# Patient Record
Sex: Female | Born: 1950
Health system: Southern US, Community
[De-identification: ages and names within clinical notes are randomized; demographics above are authoritative.]

## PROBLEM LIST (undated history)

## (undated) DIAGNOSIS — D649 Anemia, unspecified: Secondary | ICD-10-CM

## (undated) DIAGNOSIS — D352 Benign neoplasm of pituitary gland: Secondary | ICD-10-CM

## (undated) DIAGNOSIS — E785 Hyperlipidemia, unspecified: Secondary | ICD-10-CM

## (undated) DIAGNOSIS — I10 Essential (primary) hypertension: Secondary | ICD-10-CM

## (undated) DIAGNOSIS — E119 Type 2 diabetes mellitus without complications: Secondary | ICD-10-CM

## (undated) DIAGNOSIS — M1990 Unspecified osteoarthritis, unspecified site: Secondary | ICD-10-CM

## (undated) HISTORY — PX: EYE SURGERY: SHX253

## (undated) HISTORY — DX: Type 2 diabetes mellitus without complications: E11.9

## (undated) HISTORY — PX: ABDOMINAL HYSTERECTOMY: SHX81

## (undated) HISTORY — PX: COLONOSCOPY: SHX174

## (undated) HISTORY — DX: Hyperlipidemia, unspecified: E78.5

---

## 2016-11-26 ENCOUNTER — Emergency Department (HOSPITAL_COMMUNITY): Payer: 59

## 2016-11-26 ENCOUNTER — Encounter (HOSPITAL_COMMUNITY): Payer: Self-pay | Admitting: Emergency Medicine

## 2016-11-26 ENCOUNTER — Emergency Department (HOSPITAL_COMMUNITY)
Admission: EM | Admit: 2016-11-26 | Discharge: 2016-11-26 | Disposition: A | Discharge status: Home / Self Care | Payer: 59 | Attending: Emergency Medicine | Admitting: Emergency Medicine

## 2016-11-26 DIAGNOSIS — R202 Paresthesia of skin: Secondary | ICD-10-CM | POA: Diagnosis not present

## 2016-11-26 DIAGNOSIS — I10 Essential (primary) hypertension: Secondary | ICD-10-CM | POA: Insufficient documentation

## 2016-11-26 DIAGNOSIS — Z79899 Other long term (current) drug therapy: Secondary | ICD-10-CM | POA: Diagnosis not present

## 2016-11-26 DIAGNOSIS — R200 Anesthesia of skin: Secondary | ICD-10-CM | POA: Diagnosis present

## 2016-11-26 HISTORY — DX: Unspecified osteoarthritis, unspecified site: M19.90

## 2016-11-26 HISTORY — DX: Essential (primary) hypertension: I10

## 2016-11-26 LAB — CBC
HEMATOCRIT: 45.9 % (ref 36.0–46.0)
HEMOGLOBIN: 15.3 g/dL — AB (ref 12.0–15.0)
MCH: 29.4 pg (ref 26.0–34.0)
MCHC: 33.3 g/dL (ref 30.0–36.0)
MCV: 88.3 fL (ref 78.0–100.0)
Platelets: 343 10*3/uL (ref 150–400)
RBC: 5.2 MIL/uL — ABNORMAL HIGH (ref 3.87–5.11)
RDW: 13.1 % (ref 11.5–15.5)
WBC: 13.1 10*3/uL — AB (ref 4.0–10.5)

## 2016-11-26 LAB — DIFFERENTIAL
Basophils Absolute: 0 10*3/uL (ref 0.0–0.1)
Basophils Relative: 0 %
EOS PCT: 1 %
Eosinophils Absolute: 0.1 10*3/uL (ref 0.0–0.7)
LYMPHS ABS: 3.8 10*3/uL (ref 0.7–4.0)
LYMPHS PCT: 29 %
MONO ABS: 0.6 10*3/uL (ref 0.1–1.0)
MONOS PCT: 4 %
NEUTROS ABS: 8.6 10*3/uL — AB (ref 1.7–7.7)
Neutrophils Relative %: 66 %

## 2016-11-26 LAB — COMPREHENSIVE METABOLIC PANEL
ALK PHOS: 103 U/L (ref 38–126)
ALT: 28 U/L (ref 14–54)
ANION GAP: 9 (ref 5–15)
AST: 26 U/L (ref 15–41)
Albumin: 4.8 g/dL (ref 3.5–5.0)
BILIRUBIN TOTAL: 0.4 mg/dL (ref 0.3–1.2)
BUN: 12 mg/dL (ref 6–20)
CO2: 28 mmol/L (ref 22–32)
Calcium: 10.7 mg/dL — ABNORMAL HIGH (ref 8.9–10.3)
Chloride: 100 mmol/L — ABNORMAL LOW (ref 101–111)
Creatinine, Ser: 0.8 mg/dL (ref 0.44–1.00)
GFR calc Af Amer: >60 mL/min (ref >60)
GFR calc non Af Amer: >60 mL/min (ref >60)
GLUCOSE: 131 mg/dL — AB (ref 65–99)
POTASSIUM: 3.3 mmol/L — AB (ref 3.5–5.1)
Sodium: 137 mmol/L (ref 135–145)
Total Protein: 9.4 g/dL — ABNORMAL HIGH (ref 6.5–8.1)

## 2016-11-26 LAB — I-STAT CHEM 8, ED
BUN: 13 mg/dL (ref 6–20)
CALCIUM ION: 1.3 mmol/L (ref 1.15–1.40)
Chloride: 99 mmol/L — ABNORMAL LOW (ref 101–111)
Creatinine, Ser: 0.8 mg/dL (ref 0.44–1.00)
GLUCOSE: 129 mg/dL — AB (ref 65–99)
HCT: 49 % — ABNORMAL HIGH (ref 36.0–46.0)
HEMOGLOBIN: 16.7 g/dL — AB (ref 12.0–15.0)
POTASSIUM: 4.2 mmol/L (ref 3.5–5.1)
Sodium: 140 mmol/L (ref 135–145)
TCO2: 34 mmol/L — AB (ref 22–32)

## 2016-11-26 LAB — I-STAT TROPONIN, ED: TROPONIN I, POC: 0.01 ng/mL (ref 0.00–0.08)

## 2016-11-26 LAB — CBG MONITORING, ED: GLUCOSE-CAPILLARY: 100 mg/dL — AB (ref 65–99)

## 2016-11-26 LAB — PROTIME-INR
INR: 0.87
PROTHROMBIN TIME: 11.7 s (ref 11.4–15.2)

## 2016-11-26 LAB — APTT: aPTT: 23 seconds — ABNORMAL LOW (ref 24–36)

## 2016-11-26 MED ORDER — POTASSIUM CHLORIDE CRYS ER 20 MEQ PO TBCR
40.0000 meq | EXTENDED_RELEASE_TABLET | Freq: Two times a day (BID) | ORAL | Status: DC
  Administered 2016-11-26: 40 meq via ORAL
  Filled 2016-11-26: qty 2

## 2016-11-26 MED ORDER — METOPROLOL SUCCINATE ER 25 MG PO TB24
25.0000 mg | ORAL_TABLET | Freq: Once | ORAL | Status: AC
  Administered 2016-11-26: 25 mg via ORAL
  Filled 2016-11-26: qty 1

## 2016-11-26 MED ORDER — LOSARTAN POTASSIUM 50 MG PO TABS
50.0000 mg | ORAL_TABLET | Freq: Once | ORAL | Status: AC
  Administered 2016-11-26: 50 mg via ORAL
  Filled 2016-11-26: qty 1

## 2016-11-26 NOTE — ED Notes (Signed)
Called pt to draw lab work but there was no reply

## 2016-11-26 NOTE — Discharge Instructions (Signed)
Please read instructions below. Establish primary care and follow up on your blood pressure, as well as check your endocrine function as recommended from your CT scan results. It is recommended to received an MRI in 6-12 months to follow up on  Schedule an appointment with neurology for further evaluation of the numbness sensation in your hands. Take your blood pressure medication as prescribed. Return to the ER for vision changes, severe headache, chest pain, shortness or breath, or new or concerning symptoms.

## 2016-11-26 NOTE — ED Provider Notes (Signed)
[facility] DEPT Provider Note   CSN: [id number] Arrival date & time: 11/26/16  1857     History   Chief Complaint Chief Complaint  Patient presents with  . Hypertension  . Numbness    HPI Elaine Burke is a 66 y.o. female w PMHx HTN, HLD, arthritis, presenting to the ED from [facility] w gradual onset of intermittent numbness/weakness in b/l hands. She states sx started 2 weeks ago and initially symptoms were in her left hand however last week both hands were affected. Pt also with HTN in triage, and reports taking amlodipine, losartan, metoprolol and triamterene-HCTZ daily for HTN. She reports she did not taken losartan today because she read online that her symptoms could be from low potassium which she has had that side effect in the past from losartan.  She denies headache, vision changes, CP, SOB, abd pain, neck pain, nausea, tinnitus, dizziness, urinary sx, or any other complaints today.  The history is provided by the patient.    Past Medical History:  Diagnosis Date  . Arthritis   . Hypertension     There are no active problems to display for this patient.   History reviewed. No pertinent surgical history.  OB History    No data available       Home Medications    Prior to Admission medications   Medication Sig Start Date End Date Taking? Authorizing Provider  amLODipine (NORVASC) 10 MG tablet Take 1 tablet by mouth daily. 10/30/16  Yes [provider]  losartan (COZAAR) 50 MG tablet Take 1 tablet by mouth daily. 10/30/16  Yes [provider]  metoprolol succinate (TOPROL-XL) 50 MG 24 hr tablet Take 1 tablet by mouth daily. 10/30/16  Yes [provider]  triamterene-hydrochlorothiazide (DYAZIDE) 37.5-25 MG capsule Take 1 capsule by mouth daily. 10/30/16  Yes [provider]    Family History No family history on file.  Social History Social History  Substance Use Topics  . Smoking status: Never Smoker  . Smokeless  tobacco: Not on file  . Alcohol use No     Allergies   Lisinopril and Penicillins   Review of Systems Review of Systems  Constitutional: Negative for fever.  HENT: Negative for tinnitus.   Eyes: Negative for photophobia and visual disturbance.  Respiratory: Negative for shortness of breath.   Cardiovascular: Negative for chest pain, palpitations and leg swelling.  Gastrointestinal: Negative for abdominal pain, nausea and vomiting.  Genitourinary: Negative for dysuria and frequency.  Musculoskeletal: Negative for neck pain and neck stiffness.  Neurological: Positive for weakness and numbness. Negative for dizziness, speech difficulty and headaches.  All other systems reviewed and are negative.    Physical Exam Updated Vital Signs BP (!) 202/96 (BP Location: Right Arm)   Pulse 74   Temp 97.6 F (36.4 C) (Oral)   Resp 18   Ht 5\' 3"  (1.6 m)   Wt 85.7 kg (189 lb)   SpO2 99%   BMI 33.48 kg/m   Physical Exam  Constitutional: She appears well-developed and well-nourished. No distress.  Well-appearing.  HENT:  Head: Normocephalic and atraumatic.  Mouth/Throat: Oropharynx is clear and moist.  Eyes: Pupils are equal, round, and reactive to light. Conjunctivae and EOM are normal.  Neck: Normal range of motion. Neck supple.  No c-spine or paraspinal tenderness.  Cardiovascular: Normal rate, regular rhythm, normal heart sounds and intact distal pulses.  Exam reveals no friction rub.   No murmur heard. No LE edema  Pulmonary/Chest: Effort normal  and breath sounds normal. No respiratory distress. She has no wheezes. She has no rales.  Abdominal: Soft. Bowel sounds are normal. She exhibits no distension and no mass. There is no tenderness.  Neurological: She is alert.  Mental Status:  Alert, oriented, thought content appropriate, able to give a coherent history. Speech fluent without evidence of aphasia. Able to follow 2 step commands without difficulty.  Cranial Nerves:  II:   Peripheral visual fields grossly normal, pupils equal, round, reactive to light III,IV, VI: ptosis not present, extra-ocular motions intact bilaterally  V,VII: smile symmetric, facial light touch sensation equal VIII: hearing grossly normal to voice  X: uvula elevates symmetrically  XI: bilateral shoulder shrug symmetric and strong XII: midline tongue extension without fassiculations Motor:  Normal tone. 5/5 in upper and lower extremities bilaterally including strong and equal grip strength and dorsiflexion/plantar flexion Sensory: Pinprick and light touch normal in all extremities.  Deep Tendon Reflexes: 2+ and symmetric in the biceps and patella Cerebellar: normal finger-to-nose with bilateral upper extremities and normal heel-to-shin BLE Gait: normal gait and balance CV: distal pulses palpable throughout    Skin: Skin is warm.  Psychiatric: She has a normal mood and affect. Her behavior is normal.  Nursing note and vitals reviewed.    ED Treatments / Results  Labs (all labs ordered are listed, but only abnormal results are displayed) Labs Reviewed  APTT - Abnormal; Notable for the following:       Result Value   aPTT 23 (*)    All other components within normal limits  CBC - Abnormal; Notable for the following:    WBC 13.1 (*)    RBC 5.20 (*)    Hemoglobin 15.3 (*)    All other components within normal limits  DIFFERENTIAL - Abnormal; Notable for the following:    Neutro Abs 8.6 (*)    All other components within normal limits  COMPREHENSIVE METABOLIC PANEL - Abnormal; Notable for the following:    Potassium 3.3 (*)    Chloride 100 (*)    Glucose, Bld 131 (*)    Calcium 10.7 (*)    Total Protein 9.4 (*)    All other components within normal limits  CBG MONITORING, ED - Abnormal; Notable for the following:    Glucose-Capillary 100 (*)    All other components within normal limits  I-STAT CHEM 8, ED - Abnormal; Notable for the following:    Chloride 99 (*)    Glucose,  Bld 129 (*)    TCO2 34 (*)    Hemoglobin 16.7 (*)    HCT 49.0 (*)    All other components within normal limits  PROTIME-INR  I-STAT TROPONIN, ED    EKG  EKG Interpretation  Date/Time:  Thursday November 26 2016 19:48:42 EDT Ventricular Rate:  73 PR Interval:    QRS Duration: 88 QT Interval:  371 QTC Calculation: 409 R Axis:   74 Text Interpretation:  Sinus rhythm Baseline wander in lead(s) V2 V3 no prior EKG  Confirmed by Brantley Stage [phone]) on 11/26/2016 9:15:52 PM       Radiology Ct Head Wo Contrast  Result Date: 11/26/2016 CLINICAL DATA:  Generalized hand weakness and numbness EXAM: CT HEAD WITHOUT CONTRAST TECHNIQUE: Contiguous axial images were obtained from the base of the skull through the vertex without intravenous contrast. COMPARISON:  None. FINDINGS: Brain: No acute territorial infarction, or hemorrhage is seen. Borderline to slightly enlarged pituitary gland with height measurement of 11 mm. Nonenlarged ventricles. Vascular: No  hyperdense vessels.  No unexpected calcification Skull: No fracture or suspicious bone lesion Sinuses/Orbits: No acute finding. Other: None IMPRESSION: 1. No CT evidence for acute intracranial abnormality. 2. Convex enlargement of the pituitary gland, recommend correlation with endocrine function tests ; could consider 6-12 month follow-up pituitary MRI Electronically Signed   By: Donavan Foil M.D.   On: 11/26/2016 20:48    Procedures Procedures (including critical care time)  Medications Ordered in ED Medications  metoprolol succinate (TOPROL-XL) 24 hr tablet 25 mg (not administered)  losartan (COZAAR) tablet 50 mg (not administered)  potassium chloride SA (K-DUR,KLOR-CON) CR tablet 40 mEq (not administered)     Initial Impression / Assessment and Plan / ED Course  I have reviewed the triage vital signs and the nursing notes.  Pertinent labs & imaging results that were available during my care of the patient were reviewed by me and  considered in my medical decision making (see chart for details).     Pt presenting with HTN as well as numbness/weakness in b/l hands x2 weeks. Pt received CT head wo contrast that was ordered in triage, resulting negative for acute pathology but recommends follow up on endocrine function. Pt without symptoms of end-organ damage, no CP, SOB, HA, vision changes. Normal neurologic exam with strong grip bilaterally. No peripheral edema, normal lung exam. Cr wnl. No hx DM. Home evening dose of metoprolol given as well as missed dose of losartan. Neurology referral given for paresthesias. Pt advised to establish primary care to follow up on BP and check endocrine labs as well as discuss follow up imaging on pituitary gland. Pt is safe for discharge.  Patient discussed with Dr. Oleta Mouse who guided treatment and agrees with care plan.  Discussed results, findings, treatment and follow up. Patient advised of return precautions. Patient verbalized understanding and agreed with plan.  Final Clinical Impressions(s) / ED Diagnoses   Final diagnoses:  Hypertension, unspecified type  Paresthesias   New Prescriptions New Prescriptions   No medications on file     Russo, [place] N, PA-C 11/26/16 2150    Forde Dandy, MD 11/27/16 [phone]

## 2016-11-26 NOTE — ED Triage Notes (Signed)
Pt complaint of generalized hand weakness/numbness to both hands onset 2 weeks ago; seen at [facility] for same; noted hypertension.

## 2016-11-30 ENCOUNTER — Encounter: Payer: Self-pay | Admitting: Neurology

## 2016-12-03 ENCOUNTER — Other Ambulatory Visit: Payer: Self-pay | Admitting: Family Medicine

## 2016-12-03 DIAGNOSIS — Z1231 Encounter for screening mammogram for malignant neoplasm of breast: Secondary | ICD-10-CM

## 2016-12-11 ENCOUNTER — Ambulatory Visit
Admission: RE | Admit: 2016-12-11 | Discharge: 2016-12-11 | Disposition: A | Discharge status: Home / Self Care | Payer: 59 | Source: Ambulatory Visit | Attending: Family Medicine | Admitting: Family Medicine

## 2016-12-11 DIAGNOSIS — Z1231 Encounter for screening mammogram for malignant neoplasm of breast: Secondary | ICD-10-CM

## 2017-03-05 ENCOUNTER — Ambulatory Visit: Payer: 59 | Admitting: Neurology

## 2017-07-13 DIAGNOSIS — I10 Essential (primary) hypertension: Secondary | ICD-10-CM | POA: Diagnosis not present

## 2017-07-13 DIAGNOSIS — R7303 Prediabetes: Secondary | ICD-10-CM | POA: Diagnosis not present

## 2017-07-22 ENCOUNTER — Other Ambulatory Visit: Payer: Self-pay

## 2017-07-22 ENCOUNTER — Ambulatory Visit: Payer: 59 | Attending: Family Medicine | Admitting: Physical Therapy

## 2017-07-22 DIAGNOSIS — G8929 Other chronic pain: Secondary | ICD-10-CM | POA: Insufficient documentation

## 2017-07-22 DIAGNOSIS — R262 Difficulty in walking, not elsewhere classified: Secondary | ICD-10-CM

## 2017-07-22 DIAGNOSIS — M25562 Pain in left knee: Secondary | ICD-10-CM | POA: Insufficient documentation

## 2017-07-22 DIAGNOSIS — M25561 Pain in right knee: Secondary | ICD-10-CM | POA: Diagnosis not present

## 2017-07-23 ENCOUNTER — Encounter: Payer: Self-pay | Admitting: Physical Therapy

## 2017-07-23 NOTE — Therapy (Signed)
[place], [place], [address] Phone: [phone]   Fax:  [phone]  Physical Therapy Treatment  Patient Details  Name: Elaine Burke MRN: [id number] Date of Birth: 02-Aug-1950 Referring Provider: Dr Rachell Cipro    Encounter Date: 07/22/2017  PT End of Session - 07/23/17 0752    Visit Number  1    Number of Visits  12    Date for PT Re-Evaluation  09/03/17    Authorization Type  Medicare [facility]     PT Start Time  1100    PT Stop Time  1142    PT Time Calculation (min)  42 min    Activity Tolerance  Patient tolerated treatment well    Behavior During Therapy  [facility] for tasks assessed/performed       Past Medical History:  Diagnosis Date  . Arthritis   . Hypertension     History reviewed. No pertinent surgical history.  There were no vitals filed for this visit.  Subjective Assessment - 07/22/17 1106    Subjective  Patient broke her knee cap on the right 15 years ago. She feels she may have atered the way that she walked. Three years ago she began having right knee pain. She had therapy and it imporved. She began having right knee last fall and has now moved to the left knee as well. The pain is central around her knee cap in both knees. She is having trouble goin up and down the stairs at this time.,     Limitations  Standing;Walking;House hold activities steps     How long can you sit comfortably?  No limit     How long can you stand comfortably?  about an hour     How long can you walk comfortably?  Walking on side walks cause pain     Diagnostic tests  Has not had x-rays since she moved     Patient Stated Goals  Less knee pain     Currently in Pain?  Yes    Pain Score  3     Pain Location  Knee    Pain Orientation  Right    Pain Descriptors / Indicators  Aching    Pain Type  Chronic pain    Pain Onset  More than a month ago    Pain Frequency  Intermittent    Aggravating Factors    standing, walking, steps     Pain Relieving Factors  rest    Effect of Pain on Daily Activities  difficulty walking for exercises     Multiple Pain Sites  Yes    Pain Score  3    Pain Location  Knee    Pain Orientation  Left    Pain Descriptors / Indicators  Aching    Pain Type  Chronic pain    Pain Onset  More than a month ago    Pain Frequency  Intermittent    Aggravating Factors   standing, walking, stairs     Pain Relieving Factors  rest, allieve     Effect of Pain on Daily Activities  difficulty walking longer distances          [facility] PT Assessment - 07/23/17 0001      Assessment   Medical Diagnosis  Billateral knee pain     Referring Provider  Dr Rachell Cipro     Onset Date/Surgical Date  -- December of 2018 for recent exacerbation  Hand Dominance  Right    Next MD Visit  6 months     Prior Therapy  None       Precautions   Precautions  None      Restrictions   Weight Bearing Restrictions  No      Balance Screen   Has the patient fallen in the past 6 months  No    Has the patient had a decrease in activity level because of a fear of falling?   No    Is the patient reluctant to leave their home because of a fear of falling?   No      Home Environment   Additional Comments  Lives in 2 story house. Stairs are giveing her difficulty       Prior Function   Level of Independence  Independent    Vocation  Retired    Leisure  Gym,       Cognition   Overall Cognitive Status  Within Functional Limits for tasks assessed    Attention  Focused    Focused Attention  Appears intact    Memory  Appears intact    Awareness  Appears intact    Problem Solving  Appears intact      Observation/Other Assessments   Observations  bilateral flat foot     Focus on Therapeutic Outcomes (FOTO)   67% limitation       Sensation   Additional Comments  Denies parathesias       Coordination   Gross Motor Movements are Fluid and Coordinated  Yes    Fine Motor Movements are  Fluid and Coordinated  Yes      Functional Tests   Functional tests  Single leg stance      Single Leg Stance   Comments  decreased bilateral but worseon the right       AROM   Overall AROM Comments  bilateral crepitus in bilateral knees       Strength   Right Hip Flexion  4/5    Right Hip ABduction  4/5    Right Hip ADduction  4+/5    Left Hip Flexion  4+/5    Left Hip ABduction  4+/5    Left Hip ADduction  4+/5    Right Knee Flexion  4+/5    Right Knee Extension  4+/5    Left Knee Flexion  5/5    Left Knee Extension  5/5      Palpation   Patella mobility  normal patella mobility. There is some type of bone overgrowth on the right knee      Special Tests   Other special tests  patellar compression (+) bilateral       Ambulation/Gait   Gait Comments  increased foot pronation bilateral decreasedd right single leg stance; decreased bilateral hip flexion                    [facility] Adult PT Treatment/Exercise - 07/23/17 0001      Knee/Hip Exercises: Supine   Quad Sets Limitations  x10 5 sec hold     Bridges Limitations  x10 min cuing for technique     Straight Leg Raises Limitations  x10     Other Supine Knee/Hip Exercises  supine clam shell red x10              PT Education - 07/23/17 0751    Education provided  Yes    Education Details  HEP; symptom  mangement; anatomy of condition     Person(s) Educated  Patient    Methods  Explanation;Demonstration;Tactile cues;Verbal cues    Comprehension  Verbalized understanding;Returned demonstration;Verbal cues required;Tactile cues required;Need further instruction       PT Short Term Goals - 07/23/17 0912      PT SHORT TERM GOAL #1   Title  Patient will demsotrate 5/5 gross bilateral lower extremity strength     Time  4    Period  Weeks    Status  New    Target Date  08/13/17      PT SHORT TERM GOAL #2   Title  Patient will be independent with inital HEP     Time  4    Period  Weeks    Status  New     Target Date  08/13/17      PT SHORT TERM GOAL #3   Title  Patient will demsotrate full flexion and extension bilaterly without pain     Time  4    Period  Weeks    Status  New    Target Date  08/13/17        PT Long Term Goals - 07/23/17 0914      PT LONG TERM GOAL #1   Title  Patient will go up/down 8 steps without pain     Time  6    Period  Weeks    Status  New      PT LONG TERM GOAL #2   Title  Patient will stand for 1 hour without pain in order to perfrom ADL's     Time  6    Period  Weeks    Status  New    Target Date  09/03/17      PT LONG TERM GOAL #3   Title  Patient will demosntrate a 49% limitation on FOTO     Time  6    Period  Weeks    Status  New    Target Date  09/03/17            Plan - 07/23/17 0802    Clinical Impression Statement  Patient is a 67 year old female with bilateral knee pain. The right has been hurting longer but the pain fluctuates. Signs and symptoms are consitent with patellar mal-tracking. She has crepitus with flexion and extension. she has bilateral knee and hip weakness. She has decreased single leg stance bilateral. She goes to the gym. She would benefit from a stregthening and taping program with a progression to a gym program.     Clinical Presentation  Evolving    Clinical Decision Making  Moderate    Rehab Potential  Good    PT Frequency  2x / week    PT Duration  8 weeks    PT Treatment/Interventions  ADLs/Self Care Home Management;Cryotherapy;Electrical Stimulation;Iontophoresis 4mg /ml Dexamethasone;Moist Heat;Therapeutic exercise;Therapeutic activities;Neuromuscular re-education;Cognitive remediation;Patient/family education;Manual techniques;Passive range of motion;Dry needling;Taping;Stair training;Gait training    PT Next Visit Plan  reviewe HEP; consider single leg stance; [place] taping; consider modalities if needed; add nu-step; consider low step up if able; clam shell;     PT Home Exercise Plan  quad set; SLR;  supine clam shell; bridge     Consulted and Agree with Plan of Care  Patient       Patient will benefit from skilled therapeutic intervention in order to improve the following deficits and impairments:  Abnormal gait, Decreased activity tolerance, Decreased endurance, Decreased  strength  Visit Diagnosis: Chronic pain of right knee  Chronic pain of left knee  Difficulty in walking, not elsewhere classified     Problem List There are no active problems to display for this patient.   Carney Living PT DPT  07/23/2017, 9:23 AM  [facility] [address] [place], [place], [address] Phone: [phone]   Fax:  [phone]  Name: Elaine Burke MRN: [id number] Date of Birth: [birth year]

## 2017-07-27 ENCOUNTER — Encounter: Payer: Self-pay | Admitting: Physical Therapy

## 2017-07-27 ENCOUNTER — Ambulatory Visit: Payer: 59 | Admitting: Physical Therapy

## 2017-07-27 DIAGNOSIS — R262 Difficulty in walking, not elsewhere classified: Secondary | ICD-10-CM | POA: Diagnosis not present

## 2017-07-27 DIAGNOSIS — M25561 Pain in right knee: Secondary | ICD-10-CM | POA: Diagnosis not present

## 2017-07-27 DIAGNOSIS — M25562 Pain in left knee: Secondary | ICD-10-CM

## 2017-07-27 DIAGNOSIS — G8929 Other chronic pain: Secondary | ICD-10-CM

## 2017-07-27 NOTE — Therapy (Signed)
[place], [place], [address] Phone: [phone]   Fax:  [phone]  Physical Therapy Treatment  Patient Details  Name: Elaine Burke MRN: [id number] Date of Birth: 07/08/1950 Referring Provider: Dr Rachell Cipro    Encounter Date: 07/27/2017  PT End of Session - 07/27/17 0842    Visit Number  2    Number of Visits  12    Date for PT Re-Evaluation  09/03/17    Authorization Type  Medicare [facility]     PT Start Time  0800    PT Stop Time  0841    PT Time Calculation (min)  41 min    Activity Tolerance  Patient tolerated treatment well    Behavior During Therapy  [facility] for tasks assessed/performed       Past Medical History:  Diagnosis Date  . Arthritis   . Hypertension     History reviewed. No pertinent surgical history.  There were no vitals filed for this visit.  Subjective Assessment - 07/27/17 0801    Subjective  I was just here a couple of days ago, I have been working on my exercises. Nothing major going on.     Currently in Pain?  Yes    Pain Score  1     Pain Location  Knee    Pain Orientation  Right;Left    Pain Descriptors / Indicators  Aching                       [facility] Adult PT Treatment/Exercise - 07/27/17 0001      Exercises   Exercises  Knee/Hip      Knee/Hip Exercises: Standing   Heel Raises  Both;1 set;10 reps;Other (comment) heel and toe     Heel Raises Limitations  heel and toe     Lateral Step Up  Both;1 set;10 reps;Step Height: 2";Hand Hold: 0    Forward Step Up  Both;1 set;10 reps;Hand Hold: 0;Step Height: 2"    Other Standing Knee Exercises  hip hikes 1x10 B       Knee/Hip Exercises: Seated   Long Arc Quad  Both;1 set;10 reps    Long Arc Quad Limitations  red TB     Hamstring Curl  Both;1 set;10 reps    Hamstring Limitations  red TB       Knee/Hip Exercises: Supine   Quad Sets  Both;1 set;10 reps 5 second holds     Short Arc [employer]   Both;1 set;10 reps 3#     Bridges  15 reps    Bridges Limitations  --    Straight Leg Raises  Both;1 set;10 reps      Knee/Hip Exercises: Sidelying   Hip ABduction  Both;1 set;10 reps    Clams  1x10 B red TB       Knee/Hip Exercises: Prone   Hip Extension  Both;1 set;10 reps             PT Education - 07/27/17 0841    Education provided  Yes    Education Details  goal review, HEP verbal review, POC moving forward     Person(s) Educated  Patient    Methods  Explanation    Comprehension  Verbalized understanding       PT Short Term Goals - 07/23/17 0912      PT SHORT TERM GOAL #1   Title  Patient will demsotrate 5/5 gross bilateral lower extremity strength  Time  4    Period  Weeks    Status  New    Target Date  08/13/17      PT SHORT TERM GOAL #2   Title  Patient will be independent with inital HEP     Time  4    Period  Weeks    Status  New    Target Date  08/13/17      PT SHORT TERM GOAL #3   Title  Patient will demsotrate full flexion and extension bilaterly without pain     Time  4    Period  Weeks    Status  New    Target Date  08/13/17        PT Long Term Goals - 07/23/17 0914      PT LONG TERM GOAL #1   Title  Patient will go up/down 8 steps without pain     Time  6    Period  Weeks    Status  New      PT LONG TERM GOAL #2   Title  Patient will stand for 1 hour without pain in order to perfrom ADL's     Time  6    Period  Weeks    Status  New    Target Date  09/03/17      PT LONG TERM GOAL #3   Title  Patient will demosntrate a 49% limitation on FOTO     Time  6    Period  Weeks    Status  New    Target Date  09/03/17            Plan - 07/27/17 0842    Clinical Impression Statement  Patient arrives reporting compliance with HEP, doing well overall today. Continued with functional LE strengthening for LE kinetic chain this morning, also provided education regarding importance of comprehensive strengthening program to assist  in addressing and reducing pain. Patient appears to be doing well with skilled PT services thus far.     Rehab Potential  Good    PT Frequency  2x / week    PT Duration  8 weeks    PT Treatment/Interventions  ADLs/Self Care Home Management;Cryotherapy;Electrical Stimulation;Iontophoresis 4mg /ml Dexamethasone;Moist Heat;Therapeutic exercise;Therapeutic activities;Neuromuscular re-education;Cognitive remediation;Patient/family education;Manual techniques;Passive range of motion;Dry needling;Taping;Stair training;Gait training    PT Next Visit Plan  continue progressing functional strength, provide large HEP update as paitent will be out of town most of june     PT [place]  quad set; SLR; supine clam shell; bridge     Consulted and Agree with Plan of Care  Patient       Patient will benefit from skilled therapeutic intervention in order to improve the following deficits and impairments:  Abnormal gait, Decreased activity tolerance, Decreased endurance, Decreased strength  Visit Diagnosis: Chronic pain of right knee  Chronic pain of left knee  Difficulty in walking, not elsewhere classified     Problem List There are no active problems to display for this patient.   Deniece Ree PT, DPT, [place]  Supplemental Physical Therapist [place]   Pager [place] [facility] [address] [place], [place], [address] Phone: [phone]   Fax:  [phone]  Name: Elaine Burke MRN: [id number] Date of Birth: 1950/05/17

## 2017-08-06 ENCOUNTER — Encounter: Payer: 59 | Admitting: Physical Therapy

## 2017-08-18 ENCOUNTER — Encounter: Payer: Self-pay | Admitting: Family Medicine

## 2017-09-06 ENCOUNTER — Encounter: Payer: Self-pay | Admitting: Physical Therapy

## 2017-09-06 ENCOUNTER — Ambulatory Visit: Payer: Medicare Other | Attending: Family Medicine | Admitting: Physical Therapy

## 2017-09-06 DIAGNOSIS — G8929 Other chronic pain: Secondary | ICD-10-CM | POA: Insufficient documentation

## 2017-09-06 DIAGNOSIS — M25562 Pain in left knee: Secondary | ICD-10-CM | POA: Diagnosis not present

## 2017-09-06 DIAGNOSIS — M25561 Pain in right knee: Secondary | ICD-10-CM | POA: Diagnosis not present

## 2017-09-06 DIAGNOSIS — R262 Difficulty in walking, not elsewhere classified: Secondary | ICD-10-CM | POA: Insufficient documentation

## 2017-09-06 NOTE — Therapy (Signed)
[place], [place], [address] Phone: [phone]   Fax:  [phone]  Physical Therapy Treatment  Patient Details  Name: Elaine Burke MRN: [id number] Date of Birth: 1950/07/14 Referring Provider: Dr Rachell Cipro    Encounter Date: 09/06/2017  PT End of Session - 09/06/17 1109    Visit Number  3    Number of Visits  12    Date for PT Re-Evaluation  10/04/17    Authorization Type  Medicare triad     PT Start Time  1100    PT Stop Time  1143    PT Time Calculation (min)  43 min    Activity Tolerance  Patient tolerated treatment well    Behavior During Therapy  [facility] for tasks assessed/performed       Past Medical History:  Diagnosis Date  . Arthritis   . Hypertension     History reviewed. No pertinent surgical history.  There were no vitals filed for this visit.  Subjective Assessment - 09/06/17 1106    Subjective  Patient just got back from a 1 month vacation. She did more walking on her trip. She is feeling like it is more painful since her trip. She was not able to do any of her exercises on the trip. Her pain has been more medial and lateral. She is notback to [place] where she does less walking. The     Limitations  Standing;Walking;House hold activities    How long can you sit comfortably?  No limit     How long can you stand comfortably?  about an hour     How long can you walk comfortably?  Walking on side walks cause pain     Diagnostic tests  Has not had x-rays since she moved     Patient Stated Goals  Less knee pain     Currently in Pain?  Yes    Pain Score  1     Pain Location  Knee    Pain Orientation  Left    Pain Descriptors / Indicators  Aching    Pain Type  Chronic pain    Pain Onset  More than a month ago    Pain Frequency  Intermittent    Aggravating Factors   standing, walking, steps     Pain Relieving Factors  rest     Effect of Pain on Daily Activities   difficulty with exercises     Pain Score  2    Pain Location  Knee    Pain Orientation  Right    Pain Descriptors / Indicators  Aching    Pain Type  Chronic pain    Pain Onset  More than a month ago    Pain Frequency  Intermittent    Aggravating Factors   standing and walking     Pain Relieving Factors  rest         [facility] PT Assessment - 09/06/17 0001      Strength   Right Hip Flexion  4/5    Right Hip ABduction  4/5    Right Hip ADduction  4+/5    Left Hip Flexion  4+/5    Left Hip ABduction  4+/5    Left Hip ADduction  4+/5    Right Knee Flexion  4+/5    Right Knee Extension  4+/5    Left Knee Flexion  5/5    Left Knee Extension  5/5  Palpation   Patella mobility  tight patella mobility on the right; normal on the left       Special Tests   Other special tests  patellar compression (+) bilateral                            PT Education - 09/06/17 1109    Education provided  Yes    Education Details  reviewed tehcniuqe with ther-ex.     Person(s) Educated  Patient    Methods  Explanation;Demonstration;Tactile cues;Verbal cues    Comprehension  Verbalized understanding;Returned demonstration;Verbal cues required;Tactile cues required       PT Short Term Goals - 09/06/17 1329      PT SHORT TERM GOAL #1   Title  Patient will demsotrate 5/5 gross bilateral lower extremity strength     Time  4    Period  Weeks    Status  New      PT SHORT TERM GOAL #2   Title  Patient will be independent with inital HEP     Time  4    Period  Weeks    Status  New      PT SHORT TERM GOAL #3   Title  Patient will demsotrate full flexion and extension bilaterly without pain     Time  4    Period  Weeks    Status  New        PT Long Term Goals - 07/23/17 0914      PT LONG TERM GOAL #1   Title  Patient will go up/down 8 steps without pain     Time  6    Period  Weeks    Status  New      PT LONG TERM GOAL #2   Title  Patient will stand for 1  hour without pain in order to perfrom ADL's     Time  6    Period  Weeks    Status  New    [place] Date  09/03/17      PT LONG TERM GOAL #3   Title  Patient will demosntrate a 49% limitation on FOTO     Time  6    Period  Weeks    Status  New    [place] Date  09/03/17            Plan - 09/06/17 1307    Clinical Impression Statement  Patient encouraged to continue wih her exercises. She has not really started her home program because of her vacation. She is having some posterior medial pain. She was given a hamstring stretch. Therapy also reviewed taping with the patient. Therapy will go over where to purchase the tape next visit and have her tape herself. patient had no real change in her objective measures. She has been on vacation and has not been able to work on her exercises. All goals remain the same.     Clinical Presentation  Evolving    Clinical Decision Making  Moderate    PT Frequency  2x / week    PT Duration  8 weeks    PT Treatment/Interventions  ADLs/Self Care Home Management;Cryotherapy;Electrical Stimulation;Iontophoresis 4mg /ml Dexamethasone;Moist Heat;Therapeutic exercise;Therapeutic activities;Neuromuscular re-education;Cognitive remediation;Patient/family education;Manual techniques;Passive range of motion;Dry needling;Taping;Stair training;Gait training    PT Next Visit Plan  continue progressing functional strength, provide large HEP update as paitent will be out of town most of june  PT Home Exercise Plan  quad set; SLR; supine clam shell; bridge     Consulted and Agree with Plan of Care  Patient       Patient will benefit from skilled therapeutic intervention in order to improve the following deficits and impairments:  Abnormal gait, Decreased activity tolerance, Decreased endurance, Decreased strength  Visit Diagnosis: Chronic pain of right knee  Chronic pain of left knee  Difficulty in walking, not elsewhere classified     Problem List There  are no active problems to display for this patient.   Carney Living PT DPT  09/06/2017, 3:15 PM  [facility] [address] [place], [place], [address] Phone: [phone]   Fax:  [phone]  Name: Elaine Burke MRN: [id number] Date of Birth: [birth year]

## 2017-09-17 ENCOUNTER — Encounter

## 2017-09-24 ENCOUNTER — Ambulatory Visit: Payer: Medicare Other | Admitting: Physical Therapy

## 2017-09-24 ENCOUNTER — Encounter: Payer: Self-pay | Admitting: Physical Therapy

## 2017-09-24 DIAGNOSIS — G8929 Other chronic pain: Secondary | ICD-10-CM

## 2017-09-24 DIAGNOSIS — R262 Difficulty in walking, not elsewhere classified: Secondary | ICD-10-CM

## 2017-09-24 DIAGNOSIS — M25562 Pain in left knee: Secondary | ICD-10-CM | POA: Diagnosis not present

## 2017-09-24 DIAGNOSIS — M25561 Pain in right knee: Secondary | ICD-10-CM | POA: Diagnosis not present

## 2017-09-27 NOTE — Therapy (Signed)
[place], [place], [address] Phone: [phone]   Fax:  [phone]  Physical Therapy Treatment  Patient Details  Name: Elaine Burke MRN: [id number] Date of Birth: Feb 14, 1951 Referring Provider: Dr Rachell Cipro    Encounter Date: 09/24/2017  PT End of Session - 09/27/17 1254    Visit Number  4    Number of Visits  12    Date for PT Re-Evaluation  10/04/17    Authorization Type  Medicare triad     PT Start Time  0845    PT Stop Time  0928    PT Time Calculation (min)  43 min    Activity Tolerance  Patient tolerated treatment well    Behavior During Therapy  [facility] for tasks assessed/performed       Past Medical History:  Diagnosis Date  . Arthritis   . Hypertension     History reviewed. No pertinent surgical history.  There were no vitals filed for this visit.                    [place] Adult PT Treatment/Exercise - 09/27/17 0001      Self-Care   Self-Care  Other Self-Care Comments    Other Self-Care Comments   reviewed taping technique. Therapy taped the patients right knee with education. The patient then taped her left side. She required min/mod cuing for tehcnqiue. She was also shown how to get tape on her own as well as how to pull up proper taping on youtube.       Knee/Hip Exercises: Stretches   Active Hamstring Stretch  3 reps;20 seconds      Knee/Hip Exercises: Aerobic   Nustep  5 min L 5       Knee/Hip Exercises: Standing   Heel Raises  2 sets;10 reps    Hip Flexion  Limitations    Hip Flexion Limitations  standing march 2x10     Other Standing Knee Exercises  hip abduction 2x10 bilateral hip extension 2x10      Knee/Hip Exercises: Supine   Quad Sets  Both;1 set;10 reps 5 second holds     Short Arc [employer]  Both;1 set;10 reps 3#     Straight Leg Raises  Both;1 set;10 reps             PT Education - 09/27/17 1254    Education provided  Yes    Education Details  reviewed taping techniuqe     Person(s) Educated  Patient    Methods  Explanation;Demonstration;Tactile cues;Verbal cues    Comprehension  Verbalized understanding;Returned demonstration;Verbal cues required;Tactile cues required;Need further instruction       PT Short Term Goals - 09/27/17 1300      PT SHORT TERM GOAL #1   Title  Patient will demsotrate 5/5 gross bilateral lower extremity strength     Time  4    Period  Weeks    Status  On-going      PT SHORT TERM GOAL #2   Title  Patient will be independent with inital HEP     Time  4    Period  Weeks    Status  On-going      PT SHORT TERM GOAL #3   Title  Patient will demsotrate full flexion and extension bilaterly without pain     Time  4    Period  Weeks    Status  On-going  PT Long Term Goals - 07/23/17 0914      PT LONG TERM GOAL #1   Title  Patient will go up/down 8 steps without pain     Time  6    Period  Weeks    Status  New      PT LONG TERM GOAL #2   Title  Patient will stand for 1 hour without pain in order to perfrom ADL's     Time  6    Period  Weeks    Status  New    Target Date  09/03/17      PT LONG TERM GOAL #3   Title  Patient will demosntrate a 49% limitation on FOTO     Time  6    Period  Weeks    Status  New    Target Date  09/03/17            Plan - 09/27/17 1258    Clinical Impression Statement  Therapy reviewed taping with the patient. She was able to tape her left knee with min to mod cuing. She was also given standing exercises to work on. She has been more consitent with her exercises at home. Therapy will continue to progress her as tolerated. She will schedule more visits.     Clinical Presentation  Evolving    Clinical Decision Making  Moderate    Rehab Potential  Good    PT Frequency  2x / week    PT Duration  8 weeks    PT Treatment/Interventions  ADLs/Self Care Home Management;Cryotherapy;Electrical Stimulation;Iontophoresis 4mg /ml  Dexamethasone;Moist Heat;Therapeutic exercise;Therapeutic activities;Neuromuscular re-education;Cognitive remediation;Patient/family education;Manual techniques;Passive range of motion;Dry needling;Taping;Stair training;Gait training    PT Next Visit Plan  continue progressing functional strength, provide large HEP update as paitent will be out of town most of june     PT [place]  quad set; SLR; supine clam shell; bridge     Consulted and Agree with Plan of Care  Patient       Patient will benefit from skilled therapeutic intervention in order to improve the following deficits and impairments:  Abnormal gait, Decreased activity tolerance, Decreased endurance, Decreased strength  Visit Diagnosis: Chronic pain of right knee  Chronic pain of left knee  Difficulty in walking, not elsewhere classified     Problem List There are no active problems to display for this patient.   Carney Living PT DPT  09/27/2017, 1:02 PM  [facility] [address] [place], [place], [address] Phone: [phone]   Fax:  [phone]  Name: Elaine Burke MRN: [id number] Date of Birth: 1950/11/11

## 2017-09-29 ENCOUNTER — Encounter: Payer: Self-pay | Admitting: Physical Therapy

## 2017-09-29 ENCOUNTER — Ambulatory Visit: Payer: Medicare Other | Admitting: Physical Therapy

## 2017-09-29 DIAGNOSIS — M25561 Pain in right knee: Principal | ICD-10-CM

## 2017-09-29 DIAGNOSIS — G8929 Other chronic pain: Secondary | ICD-10-CM

## 2017-09-29 DIAGNOSIS — M25562 Pain in left knee: Secondary | ICD-10-CM

## 2017-09-29 DIAGNOSIS — R262 Difficulty in walking, not elsewhere classified: Secondary | ICD-10-CM | POA: Diagnosis not present

## 2017-09-30 NOTE — Therapy (Signed)
[place], [place], [address] Phone: [phone]   Fax:  [phone]  Physical Therapy Treatment  Patient Details  Name: Elaine Burke MRN: [id number] Date of Birth: [birth year] Referring Provider: Dr Rachell Cipro    Encounter Date: 09/29/2017  PT End of Session - 09/29/17 1402    Visit Number  5    Number of Visits  12    Date for PT Re-Evaluation  10/04/17    Authorization Type  Medicare triad     PT Start Time  1330    PT Stop Time  1412    PT Time Calculation (min)  42 min    Activity Tolerance  Patient tolerated treatment well    Behavior During Therapy  [facility] for tasks assessed/performed       Past Medical History:  Diagnosis Date  . Arthritis   . Hypertension     History reviewed. No pertinent surgical history.  There were no vitals filed for this visit.  Subjective Assessment - 09/29/17 1335    Subjective  Patient is having no pain today. She reports she did not get sore after the last visit.     Limitations  Standing;Walking;House hold activities    How long can you sit comfortably?  No limit     How long can you stand comfortably?  about an hour     How long can you walk comfortably?  Walking on side walks cause pain     Diagnostic tests  Has not had x-rays since she moved     Patient Stated Goals  Less knee pain     Currently in Pain?  No/denies                       [facility] Adult PT Treatment/Exercise - 09/30/17 0001      Knee/Hip Exercises: Stretches   Active Hamstring Stretch  3 reps;20 seconds    Other Knee/Hip Stretches  thomas stretch 3x30 sec right       Knee/Hip Exercises: Standing   Heel Raises  2 sets;10 reps    Hip Flexion  Limitations    Hip Flexion Limitations  standing march 2x10     Lateral Step Up  Both;1 set;10 reps;Step Height: 2";Hand Hold: 0    Forward Step Up  Both;1 set;10 reps;Hand Hold: 0;Step Height: 2"    Other Standing Knee  Exercises  hip abduction 2x10 bilateral hip extension 2x10      Knee/Hip Exercises: Seated   Long Arc Quad  Both;10 reps;2 sets    Long Arc Quad Limitations  yellow     Hamstring Curl  2 sets;20 reps    Hamstring Limitations  yellow       Knee/Hip Exercises: Supine   Quad Sets  Both;1 set;10 reps 5 second holds     Short Arc [employer]  Limitations    Short Arc Quad Sets Limitations  2x10    Bridges  20 reps    Bridges Limitations  2x10    Straight Leg Raises  1 set;10 reps;2 sets             PT Education - 09/29/17 1335    Education provided  Yes    Education Details  reviewed taping     Person(s) Educated  Patient    Methods  Explanation;Demonstration;Tactile cues;Verbal cues    Comprehension  Verbalized understanding;Returned demonstration;Verbal cues required;Tactile cues required       PT  Short Term Goals - 09/27/17 1300      PT SHORT TERM GOAL #1   Title  Patient will demsotrate 5/5 gross bilateral lower extremity strength     Time  4    Period  Weeks    Status  On-going      PT SHORT TERM GOAL #2   Title  Patient will be independent with inital HEP     Time  4    Period  Weeks    Status  On-going      PT SHORT TERM GOAL #3   Title  Patient will demsotrate full flexion and extension bilaterly without pain     Time  4    Period  Weeks    Status  On-going        PT Long Term Goals - 07/23/17 0914      PT LONG TERM GOAL #1   Title  Patient will go up/down 8 steps without pain     Time  6    Period  Weeks    Status  New      PT LONG TERM GOAL #2   Title  Patient will stand for 1 hour without pain in order to perfrom ADL's     Time  6    Period  Weeks    Status  New    Target Date  09/03/17      PT LONG TERM GOAL #3   Title  Patient will demosntrate a 49% limitation on FOTO     Time  6    Period  Weeks    Status  New    Target Date  09/03/17            Plan - 09/30/17 1230    Clinical Impression Statement  Patient tolerated  treatment well. She has no increase in pain with exercises. Therapy will advance exercise as tolerated.     PT Frequency  2x / week    PT Duration  8 weeks    PT Treatment/Interventions  ADLs/Self Care Home Management;Cryotherapy;Electrical Stimulation;Iontophoresis 4mg /ml Dexamethasone;Moist Heat;Therapeutic exercise;Therapeutic activities;Neuromuscular re-education;Cognitive remediation;Patient/family education;Manual techniques;Passive range of motion;Dry needling;Taping;Stair training;Gait training    PT Next Visit Plan  continue progressing functional strength, provide large HEP update as paitent will be out of town most of june     PT [place]  consider adding single leg stance     Consulted and Agree with Plan of Care  Patient       Patient will benefit from skilled therapeutic intervention in order to improve the following deficits and impairments:     Visit Diagnosis: Chronic pain of right knee  Chronic pain of left knee  Difficulty in walking, not elsewhere classified     Problem List There are no active problems to display for this patient.   Carney Living  PT DPT  09/30/2017, 12:34 PM  [facility] [address] [place], [place], [address] Phone: [phone]   Fax:  [phone]  Name: Elaine Burke MRN: [id number] Date of Birth: 1950-09-27

## 2017-10-04 ENCOUNTER — Ambulatory Visit: Payer: Medicare Other | Admitting: Physical Therapy

## 2017-10-04 ENCOUNTER — Encounter: Payer: Self-pay | Admitting: Physical Therapy

## 2017-10-04 DIAGNOSIS — M25561 Pain in right knee: Secondary | ICD-10-CM | POA: Diagnosis not present

## 2017-10-04 DIAGNOSIS — R262 Difficulty in walking, not elsewhere classified: Secondary | ICD-10-CM | POA: Diagnosis not present

## 2017-10-04 DIAGNOSIS — M25562 Pain in left knee: Secondary | ICD-10-CM | POA: Diagnosis not present

## 2017-10-04 DIAGNOSIS — G8929 Other chronic pain: Secondary | ICD-10-CM

## 2017-10-04 NOTE — Therapy (Signed)
[place], [place], [address] Phone: [phone]   Fax:  [phone]  Physical Therapy Treatment  Patient Details  Name: Elaine Burke MRN: [id number] Date of Birth: 12/18/1950 Referring Provider: Dr Rachell Cipro    Encounter Date: 10/04/2017  PT End of Session - 10/04/17 1025    Visit Number  6    Number of Visits  12    Date for PT Re-Evaluation  10/04/17    Authorization Type  Medicare triad     PT Start Time  1017    PT Stop Time  1057    PT Time Calculation (min)  40 min    Activity Tolerance  Patient tolerated treatment well    Behavior During Therapy  [facility] for tasks assessed/performed       Past Medical History:  Diagnosis Date  . Arthritis   . Hypertension     History reviewed. No pertinent surgical history.  There were no vitals filed for this visit.  Subjective Assessment - 10/04/17 1022    Subjective  Patient reports her right quad has been a little sore but overall she is walking better. She is having no knee pain today just some quad soreness.     Limitations  Standing;Walking;House hold activities    How long can you sit comfortably?  No limit     How long can you stand comfortably?  about an hour     How long can you walk comfortably?  Walking on side walks cause pain     Diagnostic tests  Has not had x-rays since she moved     Patient Stated Goals  Less knee pain     Currently in Pain?  No/denies just some quad soreness                       [facility] Adult PT Treatment/Exercise - 10/04/17 0001      Knee/Hip Exercises: Stretches   Active Hamstring Stretch  3 reps;20 seconds      Knee/Hip Exercises: Standing   Heel Raises  2 sets;10 reps    Hip Flexion  Limitations    Hip Flexion Limitations  standing march 2x10     Forward Step Up  Both;10 reps;Step Height: 6";2 sets    Other Standing Knee Exercises  hip abduction 2x10 bilateral hip extension 2x10       Knee/Hip Exercises: Seated   Long Arc Quad  Both;10 reps;2 sets      Knee/Hip Exercises: Supine   Quad Sets  Both;1 set;10 reps;2 sets 5 second holds     Short Arc [employer]  Limitations    Short Arc Quad Sets Limitations  3x10 1lb     Bridges  20 reps    Straight Leg Raises  2 sets;10 reps      Manual Therapy   Manual therapy comments  assesed patients patella movmenet. Impro ved superior/ inferior movement.              PT Education - 10/04/17 1024    Education provided  Yes    Education Details  reviewed tehcniuqe with exercises     Person(s) Educated  Patient    Methods  Explanation;Demonstration;Tactile cues;Verbal cues    Comprehension  Verbalized understanding;Returned demonstration;Verbal cues required;Tactile cues required       PT Short Term Goals - 09/27/17 1300      PT SHORT TERM GOAL #1   Title  Patient will  demsotrate 5/5 gross bilateral lower extremity strength     Time  4    Period  Weeks    Status  On-going      PT SHORT TERM GOAL #2   Title  Patient will be independent with inital HEP     Time  4    Period  Weeks    Status  On-going      PT SHORT TERM GOAL #3   Title  Patient will demsotrate full flexion and extension bilaterly without pain     Time  4    Period  Weeks    Status  On-going        PT Long Term Goals - 07/23/17 0914      PT LONG TERM GOAL #1   Title  Patient will go up/down 8 steps without pain     Time  6    Period  Weeks    Status  New      PT LONG TERM GOAL #2   Title  Patient will stand for 1 hour without pain in order to perfrom ADL's     Time  6    Period  Weeks    Status  New    Target Date  09/03/17      PT LONG TERM GOAL #3   Title  Patient will demosntrate a 49% limitation on FOTO     Time  6    Period  Weeks    Status  New    Target Date  09/03/17            Plan - 10/04/17 1038    Clinical Impression Statement  Therapy added weights to her exercises today. She had no increase in pain. She  had god patella superior and inferior motion today.     Clinical Presentation  Evolving    Rehab Potential  Good    PT Frequency  2x / week    PT Duration  8 weeks    PT Treatment/Interventions  ADLs/Self Care Home Management;Cryotherapy;Electrical Stimulation;Iontophoresis 4mg /ml Dexamethasone;Moist Heat;Therapeutic exercise;Therapeutic activities;Neuromuscular re-education;Cognitive remediation;Patient/family education;Manual techniques;Passive range of motion;Dry needling;Taping;Stair training;Gait training    PT Next Visit Plan  continue progressing functional strength, provide large HEP update as paitent will be out of town most of june     PT [place]  consider adding single leg stance     Consulted and Agree with Plan of Care  Patient       Patient will benefit from skilled therapeutic intervention in order to improve the following deficits and impairments:  Abnormal gait, Decreased activity tolerance, Decreased endurance, Decreased strength  Visit Diagnosis: Chronic pain of right knee  Chronic pain of left knee  Difficulty in walking, not elsewhere classified     Problem List There are no active problems to display for this patient.   Carney Living PT DPT  10/04/2017, 11:19 AM  [facility] [address] [place], [place], [address] Phone: [phone]   Fax:  [phone]  Name: Elaine Burke MRN: [id number] Date of Birth: [birth year]

## 2017-10-14 ENCOUNTER — Ambulatory Visit: Payer: Medicare Other | Attending: Family Medicine | Admitting: Physical Therapy

## 2017-10-14 ENCOUNTER — Encounter: Payer: Self-pay | Admitting: Physical Therapy

## 2017-10-14 DIAGNOSIS — R262 Difficulty in walking, not elsewhere classified: Secondary | ICD-10-CM | POA: Diagnosis not present

## 2017-10-14 DIAGNOSIS — M25562 Pain in left knee: Secondary | ICD-10-CM | POA: Insufficient documentation

## 2017-10-14 DIAGNOSIS — M25561 Pain in right knee: Secondary | ICD-10-CM | POA: Insufficient documentation

## 2017-10-14 DIAGNOSIS — G8929 Other chronic pain: Secondary | ICD-10-CM | POA: Diagnosis not present

## 2017-10-15 ENCOUNTER — Encounter

## 2017-10-15 NOTE — Therapy (Signed)
[place], [place], [address] Phone: [phone]   Fax:  [phone]  Physical Therapy Treatment  Patient Details  Name: Elaine Burke MRN: [id number] Date of Birth: August 23, 1950 Referring Provider: Dr Rachell Cipro    Encounter Date: 10/14/2017  PT End of Session - 10/14/17 1511    Visit Number  7    Number of Visits  12    Date for PT Re-Evaluation  10/04/17    Authorization Type  Medicare triad     PT Start Time  0300    PT Stop Time  0339    PT Time Calculation (min)  39 min    Activity Tolerance  Patient tolerated treatment well    Behavior During Therapy  [facility] for tasks assessed/performed       Past Medical History:  Diagnosis Date  . Arthritis   . Hypertension     History reviewed. No pertinent surgical history.  There were no vitals filed for this visit.  Subjective Assessment - 10/14/17 1507    Subjective  Patient reports her right leg is feeling tired today. She has been doing a lot of work around the house. She has already donw a lot of stairs today. She has not had much pain at all.     How long can you sit comfortably?  No limit     How long can you stand comfortably?  about an hour     How long can you walk comfortably?  Walking on side walks cause pain     Diagnostic tests  Has not had x-rays since she moved     Patient Stated Goals  Less knee pain     Currently in Pain?  No/denies                       [facility] Adult PT Treatment/Exercise - 10/15/17 0001      Knee/Hip Exercises: Stretches   Active Hamstring Stretch  3 reps;20 seconds    Other Knee/Hip Stretches  thomas stretch 3x30 sec right       Knee/Hip Exercises: Standing   Heel Raises  2 sets;10 reps      Knee/Hip Exercises: Supine   Quad Sets  Both;1 set;10 reps;2 sets   5 second holds    Short Arc [employer]  Limitations    Bridges  20 reps    Straight Leg Raises  2 sets;10 reps              PT Education - 10/14/17 1510    Education provided  Yes    Education Details  reviewed technique with ther-ex     Person(s) Educated  Patient    Methods  Explanation;Demonstration;Tactile cues;Verbal cues    Comprehension  Verbalized understanding;Returned demonstration;Verbal cues required;Tactile cues required;Need further instruction       PT Short Term Goals - 09/27/17 1300      PT SHORT TERM GOAL #1   Title  Patient will demsotrate 5/5 gross bilateral lower extremity strength     Time  4    Period  Weeks    Status  On-going      PT SHORT TERM GOAL #2   Title  Patient will be independent with inital HEP     Time  4    Period  Weeks    Status  On-going      PT SHORT TERM GOAL #3   Title  Patient will demsotrate  full flexion and extension bilaterly without pain     Time  4    Period  Weeks    Status  On-going        PT Long Term Goals - 07/23/17 0914      PT LONG TERM GOAL #1   Title  Patient will go up/down 8 steps without pain     Time  6    Period  Weeks    Status  New      PT LONG TERM GOAL #2   Title  Patient will stand for 1 hour without pain in order to perfrom ADL's     Time  6    Period  Weeks    Status  New    [place] Date  09/03/17      PT LONG TERM GOAL #3   Title  Patient will demosntrate a 49% limitation on FOTO     Time  6    Period  Weeks    Status  New    Target Date  09/03/17            Plan - 10/14/17 1630    Clinical Impression Statement  DDespite baseline fatigue the patient tolerated treatment well. She had no significant increase in pain. Exercises kept light this visit 2nd to fatigue. Therapy wqill advance exercises next visit.     Clinical Presentation  Evolving    Clinical Decision Making  Moderate    Rehab Potential  Good    PT Treatment/Interventions  ADLs/Self Care Home Management;Cryotherapy;Electrical Stimulation;Iontophoresis 4mg /ml Dexamethasone;Moist Heat;Therapeutic exercise;Therapeutic  activities;Neuromuscular re-education;Cognitive remediation;Patient/family education;Manual techniques;Passive range of motion;Dry needling;Taping;Stair training;Gait training    PT Next Visit Plan  continue progressing functional strength, provide large HEP update as paitent will be out of town most of june     PT [place]  consider adding single leg stance     Consulted and Agree with Plan of Care  Patient       Patient will benefit from skilled therapeutic intervention in order to improve the following deficits and impairments:  Abnormal gait, Decreased activity tolerance, Decreased endurance, Decreased strength  Visit Diagnosis: Chronic pain of right knee  Chronic pain of left knee  Difficulty in walking, not elsewhere classified     Problem List There are no active problems to display for this patient.   Carney Living PT DPT  10/15/2017, 8:56 AM  [facility] [address] [place], [place], [address] Phone: [phone]   Fax:  [phone]  Name: Elaine Burke MRN: [id number] Date of Birth: 1950-04-23

## 2017-10-18 ENCOUNTER — Ambulatory Visit: Payer: Medicare Other | Admitting: Physical Therapy

## 2017-10-18 DIAGNOSIS — G8929 Other chronic pain: Secondary | ICD-10-CM | POA: Diagnosis not present

## 2017-10-18 DIAGNOSIS — R262 Difficulty in walking, not elsewhere classified: Secondary | ICD-10-CM | POA: Diagnosis not present

## 2017-10-18 DIAGNOSIS — M25561 Pain in right knee: Secondary | ICD-10-CM | POA: Diagnosis not present

## 2017-10-18 DIAGNOSIS — M25562 Pain in left knee: Secondary | ICD-10-CM

## 2017-10-19 NOTE — Therapy (Signed)
[place], [place], [address] Phone: [phone]   Fax:  [phone]  Physical Therapy Treatment/ Re-cert   Patient Details  Name: Elaine Burke MRN: [id number] Date of Birth: 04-Dec-1950 Referring Provider: Dr Rachell Cipro    Encounter Date: 10/18/2017  PT End of Session - 10/18/17 1601    Visit Number  8    Number of Visits  12    Date for PT Re-Evaluation  11/15/17    Authorization Type  Medicare triad     PT Start Time  0086    PT Stop Time  1625    PT Time Calculation (min)  40 min    Activity Tolerance  Patient tolerated treatment well    Behavior During Therapy  [facility] for tasks assessed/performed       Past Medical History:  Diagnosis Date  . Arthritis   . Hypertension     No past surgical history on file.  There were no vitals filed for this visit.  Subjective Assessment - 10/18/17 1547    Subjective  Patient reports no pain in her knees over the weekend. She is having no pain today.     Limitations  Standing;Walking;House hold activities    How long can you sit comfortably?  No limit     How long can you stand comfortably?  about an hour     How long can you walk comfortably?  Walking on side walks cause pain     Diagnostic tests  Has not had x-rays since she moved     Patient Stated Goals  Less knee pain     Currently in Pain?  No/denies                       [facility] Adult PT Treatment/Exercise - 10/19/17 0001      Knee/Hip Exercises: Stretches   Active Hamstring Stretch  3 reps;20 seconds    Other Knee/Hip Stretches  thomas stretch 3x30 sec right       Knee/Hip Exercises: Machines for Strengthening   Cybex Leg Press  20lbs 2x10       Knee/Hip Exercises: Standing   Heel Raises  2 sets;10 reps    Hip Flexion Limitations  standing march 2x10     Forward Step Up  4 sets;10 reps    Other Standing Knee Exercises  hip abduction 2x10 bilateral hip extension 2x10       Knee/Hip Exercises: Supine   Quad Sets  Both;1 set;10 reps;2 sets   5 second holds    Short Arc [employer]  Limitations    Bridges  20 reps    Straight Leg Raises  2 sets;10 reps             PT Education - 10/18/17 1602    Education provided  Yes    Education Details  reviewed HEP.    Person(s) Educated  Patient    Methods  Explanation;Demonstration;Tactile cues;Verbal cues    Comprehension  Verbalized understanding;Verbal cues required;Returned demonstration;Tactile cues required       PT Short Term Goals - 09/27/17 1300      PT SHORT TERM GOAL #1   Title  Patient will demsotrate 5/5 gross bilateral lower extremity strength     Time  4    Period  Weeks    Status  On-going      PT SHORT TERM GOAL #2   Title  Patient will be independent  with inital HEP     Time  4    Period  Weeks    Status  On-going      PT SHORT TERM GOAL #3   Title  Patient will demsotrate full flexion and extension bilaterly without pain     Time  4    Period  Weeks    Status  On-going        PT Long Term Goals - 10/18/17 1606      PT LONG TERM GOAL #1   Title  Patient will go up/down 8 steps without pain     Baseline  working on steps with therapy     Time  6    Period  Weeks    Status  On-going      PT LONG TERM GOAL #2   Title  Patient will stand for 1 hour without pain in order to perfrom ADL's     Baseline  improved ability to perfrom housework     Time  6    Period  Weeks    Status  On-going      PT LONG TERM GOAL #3   Title  Patient will demosntrate a 49% limitation on FOTO     Time  6    Period  Weeks    Status  On-going            Plan - 10/18/17 1603    Clinical Impression Statement  Patient is making good progress. Therapy advanced her weight with no increase in pain. Patient has 1 more visit next week then will return in 2 weeks after that for a follow up. At thsat times she will likely discharge if she reamins pain free.     Clinical Presentation   Evolving    Clinical Decision Making  Moderate    Rehab Potential  Good    PT Frequency  2x / week    PT Duration  8 weeks    PT Treatment/Interventions  ADLs/Self Care Home Management;Cryotherapy;Electrical Stimulation;Iontophoresis 4mg /ml Dexamethasone;Moist Heat;Therapeutic exercise;Therapeutic activities;Neuromuscular re-education;Cognitive remediation;Patient/family education;Manual techniques;Passive range of motion;Dry needling;Taping;Stair training;Gait training    PT Next Visit Plan  continue progressing functional strength, provide large HEP update as paitent will be out of town most of june     PT [place]  consider adding single leg stance     Consulted and Agree with Plan of Care  Patient       Patient will benefit from skilled therapeutic intervention in order to improve the following deficits and impairments:  Abnormal gait, Decreased activity tolerance, Decreased endurance, Decreased strength  Visit Diagnosis: Chronic pain of right knee  Chronic pain of left knee  Difficulty in walking, not elsewhere classified     Problem List There are no active problems to display for this patient.   Carney Living 10/19/2017, 7:59 AM  [facility] [address] [place], [place], [address] Phone: [phone]   Fax:  [phone]  Name: Elaine Burke MRN: [id number] Date of Birth: [birth year]

## 2017-10-28 ENCOUNTER — Ambulatory Visit: Payer: Medicare Other | Admitting: Physical Therapy

## 2017-11-09 ENCOUNTER — Encounter: Payer: Self-pay | Admitting: Physical Therapy

## 2017-11-09 ENCOUNTER — Ambulatory Visit: Payer: Medicare Other | Attending: Family Medicine | Admitting: Physical Therapy

## 2017-11-09 DIAGNOSIS — M25561 Pain in right knee: Secondary | ICD-10-CM | POA: Diagnosis not present

## 2017-11-09 DIAGNOSIS — R262 Difficulty in walking, not elsewhere classified: Secondary | ICD-10-CM | POA: Diagnosis not present

## 2017-11-09 DIAGNOSIS — G8929 Other chronic pain: Secondary | ICD-10-CM | POA: Diagnosis not present

## 2017-11-09 DIAGNOSIS — M25562 Pain in left knee: Secondary | ICD-10-CM | POA: Diagnosis not present

## 2017-11-09 NOTE — Therapy (Signed)
[place], [place], [address] Phone: [phone]   Fax:  [phone]  Physical Therapy Treatment  Patient Details  Name: Elaine Burke MRN: [id number] Date of Birth: 11-05-1950 Referring Provider: Dr Rachell Cipro    Encounter Date: 11/09/2017  PT End of Session - 11/09/17 1108    Visit Number  9    Number of Visits  12    Date for PT Re-Evaluation  11/15/17    Authorization Type  Medicare triad     PT Start Time  1102    PT Stop Time  1145    PT Time Calculation (min)  43 min    Activity Tolerance  Patient tolerated treatment well    Behavior During Therapy  [facility] for tasks assessed/performed       Past Medical History:  Diagnosis Date  . Arthritis   . Hypertension     History reviewed. No pertinent surgical history.  There were no vitals filed for this visit.  Subjective Assessment - 11/09/17 1106    Subjective  Patient continues to have no pain. She has been working on her exercises.     Limitations  Standing;Walking;House hold activities    How long can you sit comfortably?  No limit     How long can you stand comfortably?  about an hour     How long can you walk comfortably?  Walking on side walks cause pain     Diagnostic tests  Has not had x-rays since she moved     Patient Stated Goals  Less knee pain     Currently in Pain?  No/denies                       [facility] Adult PT Treatment/Exercise - 11/09/17 0001      Knee/Hip Exercises: Stretches   Active Hamstring Stretch  3 reps;20 seconds    Other Knee/Hip Stretches  thomas stretch 3x30 sec right       Knee/Hip Exercises: Machines for Strengthening   Cybex Leg Press  20lbs 2x10       Knee/Hip Exercises: Standing   Heel Raises  2 sets;10 reps    Hip Flexion Limitations  standing march 2x10 2lb    Other Standing Knee Exercises  hip abduction 2x10 bilateral hip extension 2x10 2lb each       Knee/Hip Exercises:  Seated   Long Arc Quad Limitations  2lb 2x10       Knee/Hip Exercises: Supine   Short Arc Quad Sets Limitations  x20 2lb     Straight Leg Raises  2 sets;10 reps    Straight Leg Raises Limitations  2lb              PT Education - 11/09/17 1108    Education provided  Yes    Education Details  reviewed HEP     Person(s) Educated  Patient    Methods  Explanation;Demonstration;Tactile cues;Verbal cues;Handout    Comprehension  Verbalized understanding;Returned demonstration;Verbal cues required;Tactile cues required;Need further instruction       PT Short Term Goals - 09/27/17 1300      PT SHORT TERM GOAL #1   Title  Patient will demsotrate 5/5 gross bilateral lower extremity strength     Time  4    Period  Weeks    Status  On-going      PT SHORT TERM GOAL #2   Title  Patient will be independent  with inital HEP     Time  4    Period  Weeks    Status  On-going      PT SHORT TERM GOAL #3   Title  Patient will demsotrate full flexion and extension bilaterly without pain     Time  4    Period  Weeks    Status  On-going        PT Long Term Goals - 10/18/17 1606      PT LONG TERM GOAL #1   Title  Patient will go up/down 8 steps without pain     Baseline  working on steps with therapy     Time  6    Period  Weeks    Status  On-going      PT LONG TERM GOAL #2   Title  Patient will stand for 1 hour without pain in order to perfrom ADL's     Baseline  improved ability to perfrom housework     Time  6    Period  Weeks    Status  On-going      PT LONG TERM GOAL #3   Title  Patient will demosntrate a 49% limitation on FOTO     Time  6    Period  Weeks    Status  On-going            Plan - 11/09/17 1109    Clinical Impression Statement  Patient continues to make good progress. SWhe had no increase in pain with her exercises. Sge will likley D/C next visit.     Clinical Presentation  Evolving    Clinical Decision Making  Moderate    Rehab Potential  Good     PT Frequency  2x / week    PT Duration  8 weeks    PT Treatment/Interventions  ADLs/Self Care Home Management;Cryotherapy;Electrical Stimulation;Iontophoresis 4mg /ml Dexamethasone;Moist Heat;Therapeutic exercise;Therapeutic activities;Neuromuscular re-education;Cognitive remediation;Patient/family education;Manual techniques;Passive range of motion;Dry needling;Taping;Stair training;Gait training    PT Next Visit Plan  continue progressing functional strength, provide large HEP update as paitent will be out of town most of june     PT [place]  consider adding single leg stance     Consulted and Agree with Plan of Care  Patient       Patient will benefit from skilled therapeutic intervention in order to improve the following deficits and impairments:  Abnormal gait, Decreased activity tolerance, Decreased endurance, Decreased strength  Visit Diagnosis: Chronic pain of right knee  Chronic pain of left knee  Difficulty in walking, not elsewhere classified     Problem List There are no active problems to display for this patient.   Carney Living PT DPT  11/09/2017, 1:29 PM  [facility] [address] [place], [place], [address] Phone: [phone]   Fax:  [phone]  Name: Shifa Brisbon MRN: [id number] Date of Birth: 06-25-1950

## 2017-11-16 ENCOUNTER — Ambulatory Visit: Payer: Medicare Other | Admitting: Physical Therapy

## 2017-11-24 ENCOUNTER — Encounter: Payer: Self-pay | Admitting: Physical Therapy

## 2017-11-24 ENCOUNTER — Ambulatory Visit: Payer: Medicare Other | Admitting: Physical Therapy

## 2017-11-24 DIAGNOSIS — R262 Difficulty in walking, not elsewhere classified: Secondary | ICD-10-CM | POA: Diagnosis not present

## 2017-11-24 DIAGNOSIS — G8929 Other chronic pain: Secondary | ICD-10-CM | POA: Diagnosis not present

## 2017-11-24 DIAGNOSIS — M25561 Pain in right knee: Principal | ICD-10-CM

## 2017-11-24 DIAGNOSIS — M25562 Pain in left knee: Secondary | ICD-10-CM

## 2017-11-25 ENCOUNTER — Encounter: Payer: Self-pay | Admitting: Physical Therapy

## 2017-11-25 NOTE — Therapy (Signed)
[place], [place], [address] Phone: [phone]   Fax:  [phone]  Physical Therapy Treatment/ Discharge   Patient Details  Name: Elaine Burke MRN: [id number] Date of Birth: 1951-01-24 Referring Provider: Dr Rachell Cipro    Encounter Date: 11/24/2017  PT End of Session - 11/24/17 1345    Visit Number  10    Number of Visits  12    Date for PT Re-Evaluation  11/15/17    Authorization Type  Medicare triad     PT Start Time  1330    PT Stop Time  1413    PT Time Calculation (min)  43 min    Activity Tolerance  Patient tolerated treatment well    Behavior During Therapy  [facility] for tasks assessed/performed       Past Medical History:  Diagnosis Date  . Arthritis   . Hypertension     History reviewed. No pertinent surgical history.  There were no vitals filed for this visit.  Subjective Assessment - 11/24/17 1334    Subjective  Patient reports her knee has been doing well. She was able to stand yesterday for about and hour. She is still having some pain bending down.     Limitations  Standing;Walking;House hold activities    How long can you sit comfortably?  No limit     How long can you stand comfortably?  about an hour     How long can you walk comfortably?  Walking on side walks cause pain     Diagnostic tests  Has not had x-rays since she moved     Patient Stated Goals  Less knee pain     Currently in Pain?  No/denies                       [facility] Adult PT Treatment/Exercise - 11/25/17 0001      Self-Care   Other Self-Care Comments   reviewed self taping and reviewed exercise progression       Knee/Hip Exercises: Stretches   Active Hamstring Stretch  3 reps;20 seconds    Other Knee/Hip Stretches  thomas stretch 3x30 sec right       Knee/Hip Exercises: Machines for Strengthening   Cybex Leg Press  20lbs 2x10       Knee/Hip Exercises: Standing   Heel Raises  2  sets;10 reps    Hip Flexion Limitations  standing march 2x10 2lb    Other Standing Knee Exercises  hip abduction 2x10 bilateral hip extension 2x10 2lb each       Knee/Hip Exercises: Seated   Long Arc Quad Limitations  2lb 2x10       Knee/Hip Exercises: Supine   Quad Sets  10 reps;2 sets;Both   5 second holds    Straight Leg Raises  2 sets;10 reps    Straight Leg Raises Limitations  2lb              PT Education - 11/24/17 1345    Education provided  Yes    Education Details  reviewed final HEP     Person(s) Educated  Patient    Methods  Explanation;Demonstration;Tactile cues;Handout;Verbal cues    Comprehension  Verbalized understanding;Returned demonstration;Verbal cues required;Tactile cues required       PT Short Term Goals - 11/25/17 1022      PT SHORT TERM GOAL #1   Title  Patient will demsotrate 5/5 gross bilateral lower extremity strength  Baseline  5/5 gross     Time  4    Period  Weeks    Status  Achieved      PT SHORT TERM GOAL #2   Title  Patient will be independent with inital HEP     Baseline  independnet with intial HEP     Time  4    Period  Weeks    Status  On-going      PT SHORT TERM GOAL #3   Title  Patient will demsotrate full flexion and extension bilaterly without pain     Baseline  full without pain     Time  4    Period  Weeks    Status  On-going        PT Long Term Goals - 11/25/17 1023      PT LONG TERM GOAL #1   Title  Patient will go up/down 8 steps without pain     Baseline  able to go up and down without difficulty     Time  6    Period  Weeks    Status  Achieved      PT LONG TERM GOAL #2   Title  Patient will stand for 1 hour without pain in order to perfrom ADL's     Baseline  improved ability to perfrom housework was able to stand for an hour yesterday     Period  Weeks    Status  Achieved      PT LONG TERM GOAL #3   Title  Patient will demosntrate a 49% limitation on FOTO     Baseline  47%     Time  6     Status  Achieved            Plan - 11/24/17 1355    Clinical Impression Statement  Patient feels comfortable with her exercises. She tolerated treatment well today. DShe has et all goals for therapy. See below for goal specific progress. The patient has made good progress overall. She is back to an exercise program and she feels like she has beeter ability to perfrom functional tasks. D/.C to HEP.     Clinical Presentation  Evolving    Clinical Decision Making  Moderate    Rehab Potential  Good    PT Frequency  2x / week    PT Duration  8 weeks    PT Treatment/Interventions  ADLs/Self Care Home Management;Cryotherapy;Electrical Stimulation;Iontophoresis 73m/ml Dexamethasone;Moist Heat;Therapeutic exercise;Therapeutic activities;Neuromuscular re-education;Cognitive remediation;Patient/family education;Manual techniques;Passive range of motion;Dry needling;Taping;Stair training;Gait training    PT Next Visit Plan  continue progressing functional strength, provide large HEP update as paitent will be out of town most of june     PLa [place] consider adding single leg stance     Consulted and Agree with Plan of Care  Patient       Patient will benefit from skilled therapeutic intervention in order to improve the following deficits and impairments:  Abnormal gait, Decreased activity tolerance, Decreased endurance, Decreased strength  Visit Diagnosis: Chronic pain of right knee  Chronic pain of left knee  Difficulty in walking, not elsewhere classified  PHYSICAL THERAPY DISCHARGE SUMMARY  Visits from Start of Care: 10  Current functional level related to goals / functional outcomes: Improved pain and fictional mobility    Remaining deficits: Pain bending down    Education / Equipment: HEP  Plan: Patient agrees to discharge.  Patient goals were met. Patient is being discharged  due to meeting the stated rehab goals.  ?????       Problem List There are no active  problems to display for this patient.   Carney Living PT DPT  11/25/2017, 10:26 AM  [facility] [address] [place], [place], [address] Phone: [phone]   Fax:  [phone]  Name: Lamya Lausch MRN: [id number] Date of Birth: 1950/12/14

## 2018-01-04 DIAGNOSIS — Z23 Encounter for immunization: Secondary | ICD-10-CM | POA: Diagnosis not present

## 2018-01-14 ENCOUNTER — Other Ambulatory Visit: Payer: Self-pay | Admitting: Family Medicine

## 2018-01-14 DIAGNOSIS — Z1231 Encounter for screening mammogram for malignant neoplasm of breast: Secondary | ICD-10-CM

## 2018-02-14 ENCOUNTER — Encounter: Payer: Medicare Other | Attending: Family Medicine | Admitting: Registered"

## 2018-02-14 ENCOUNTER — Encounter: Payer: Self-pay | Admitting: Registered"

## 2018-02-14 DIAGNOSIS — R7303 Prediabetes: Secondary | ICD-10-CM | POA: Insufficient documentation

## 2018-02-14 NOTE — Progress Notes (Signed)
Patient was seen on 02/14/18 for the Core Session 1 of Diabetes Prevention Program course at [facility]. The following learning objectives were met by the patient during this class:   Learning Objectives:   Be able to explain the purpose and benefits of the National Diabetes Prevention Program.   Be able to describe the events that will take place at every session.   Know the weight loss and physical activity goals established by the [facility] Diabetes Prevention Program.   Know their own individual weight loss and physical activity goals.   Be able to explain the important effect of self-monitoring on behavior change.   Goals:   Record food and beverage intake in "Food and Activity Tracker" over the next week.   Bring completed "Food and Activity Tracker" for session 1 to session 2 next week.  Circle the foods or beverages you think are highest in fat and calories in your food tracker.  Read the labels on the food you buy, and consider using measuring cups and spoons to help you calculate the amount you eat. We will talk about measuring in more detail in the coming weeks.   Follow-Up Plan:  Attend Core Session 2 next week.   Bring completed "Food and Activity Tracker" next week to be reviewed by Lifestyle Coach.

## 2018-02-21 ENCOUNTER — Encounter (HOSPITAL_BASED_OUTPATIENT_CLINIC_OR_DEPARTMENT_OTHER): Payer: Medicare Other | Admitting: Registered"

## 2018-02-21 ENCOUNTER — Encounter: Payer: Self-pay | Admitting: Registered"

## 2018-02-21 DIAGNOSIS — R7303 Prediabetes: Secondary | ICD-10-CM | POA: Diagnosis not present

## 2018-02-21 NOTE — Progress Notes (Signed)
Patient was seen on 02/21/18 for the Core Session 2 of Diabetes Prevention Program course at [facility]. By the end of this session patients are able to complete the following objectives:   Learning Objectives:  Self-monitor their weight during the weeks following Session 2.   Describe the relationship between fat and calories.   Explain the reason for, and basic principles of, self-monitoring fat grams and calories.   Identify their personal fat gram goals.   Use the ?Fat and Calorie Counter to calculate the calories and fat grams of a given selection of foods.   Keep a running total of the fat grams they eat each day.   Calculate fat, calories, and serving sizes from nutrition labels.   Goals:   Weigh yourself at the same time each day, or every few days, and record your weight in your Food and Activity Tracker.  Write down everything you eat and drink in your Food and Activity Tracker.  Measure portions as much as you can, and start reading labels.   Use the ?Fat and Calorie Counter to figure out the amount of fat and calories in what you ate, and write the amount down in your Food and Activity Tracker.  Keep a running fat gram total throughout the day. Come as close to your fat gram goal as you can.   Follow-Up Plan:  Attend Core Session 3 next week.   Bring completed "Food and Activity Tracker" next week to be reviewed by Lifestyle Coach.

## 2018-02-25 ENCOUNTER — Ambulatory Visit: Payer: 59

## 2018-02-28 ENCOUNTER — Encounter: Payer: Self-pay | Admitting: Registered"

## 2018-02-28 ENCOUNTER — Encounter (HOSPITAL_BASED_OUTPATIENT_CLINIC_OR_DEPARTMENT_OTHER): Payer: Medicare Other | Admitting: Registered"

## 2018-02-28 DIAGNOSIS — R7303 Prediabetes: Secondary | ICD-10-CM | POA: Diagnosis not present

## 2018-02-28 NOTE — Progress Notes (Signed)
Patient was seen on 02/28/18 for the Core Session 3 of Diabetes Prevention Program course at [facility]. By the end of this session patients are able to complete the following objectives:   Learning Objectives:  Weigh and measure foods.  Estimate the fat and calorie content of common foods.  Describe three ways to eat less fat and fewer calories.  Create a plan to eat less fat for the following week.   Goals:   Track weight when weighing outside of class.   Track food and beverages eaten each day in Food and Activity Tracker and include fat grams and calories for each.   Try to stay within fat gram goal.   Complete plan for eating less high fat foods and answer related homework questions.    Follow-Up Plan:  Attend Core Session 4 next week.   Bring completed "Food and Activity Tracker" next week to be reviewed by Lifestyle Coach.

## 2018-03-14 ENCOUNTER — Encounter: Payer: Medicare Other | Attending: Family Medicine | Admitting: Registered"

## 2018-03-14 ENCOUNTER — Encounter: Payer: Self-pay | Admitting: Registered"

## 2018-03-14 DIAGNOSIS — R7303 Prediabetes: Secondary | ICD-10-CM

## 2018-03-14 NOTE — Progress Notes (Signed)
Patient was seen on 03/14/18 for the Core Session 5 of Diabetes Prevention Program course at [facility]. By the end of this session patients are able to complete the following objectives:   Learning Objectives:  Establish a physical activity goal.  Explain the importance of the physical activity goal.  Describe their current level of physical activity.  Name ways that they are already physically active.  Develop personal plans for physical activity for the next week.   Goals:   Record weight taken outside of class.   Track foods and beverages eaten each day in the "Food and Activity Tracker," including calories and fat grams for each item.   Make an Activity Plan including date, specific type of activity, and length of time you plan to be active that includes at last 60 minutes of activity for the week.   Track activity type, minutes you were active, and distance you reached each day in the "Food and Activity Tracker."   Follow-Up Plan:  Attend Core Session 6 next week.   Bring completed "Food and Activity Tracker" next week to be reviewed by Lifestyle Coach.

## 2018-03-18 ENCOUNTER — Encounter: Payer: Self-pay | Admitting: Registered"

## 2018-03-18 ENCOUNTER — Encounter (HOSPITAL_BASED_OUTPATIENT_CLINIC_OR_DEPARTMENT_OTHER): Payer: Medicare Other | Admitting: Registered"

## 2018-03-18 DIAGNOSIS — R7303 Prediabetes: Secondary | ICD-10-CM

## 2018-03-18 NOTE — Progress Notes (Signed)
Patient was seen on 03/18/18 for the Core Session 4 of Diabetes Prevention Program course at [facility]. By the end of this session patients are able to complete the following objectives:   Learning Objectives:  Explain the health benefits of eating less fat and fewer calories.  Describe the MyPlate food guide and its recommendations, includinghow to reduce fat and calories in our diet.  Compare and contrast MyPlate guidelines with participants' eating habits.  List ways to replace high-fat and high-calorie foods with low-fat and low-calorie foods.  Explain the importance of eating plenty of whole grains, vegetables, and fruits, while staying within fat gram goals.  Explain the importance of eating foods from all groups of MyPlate and of eating a variety of foods from within each group.  Explain why a balanced diet is beneficial to health.  Explain why eating the same foods over and over is not the best strategy for long-term success.   Goals:   Record weight taken outside of class.   Track foods and beverages eaten each day in the "Food and Activity Tracker," including calories and fat grams for each item.   Practice comparing what you eat with the recommendations of MyPlate using the "Rate Your Plate" handout.   Complete the "Rate Your Plate" handout form on at least 3 days.   Answer homework questions.   Follow-Up Plan:  Attend Core Session 5 next week.   Bring completed "Food and Activity Tracker" next week to be reviewed by Lifestyle Coach.

## 2018-03-21 ENCOUNTER — Encounter: Payer: Self-pay | Admitting: Registered"

## 2018-03-21 ENCOUNTER — Ambulatory Visit
Admission: RE | Admit: 2018-03-21 | Discharge: 2018-03-21 | Disposition: A | Discharge status: Home / Self Care | Payer: Medicare Other | Source: Ambulatory Visit | Attending: Family Medicine | Admitting: Family Medicine

## 2018-03-21 ENCOUNTER — Encounter (HOSPITAL_BASED_OUTPATIENT_CLINIC_OR_DEPARTMENT_OTHER): Payer: Medicare Other | Admitting: Registered"

## 2018-03-21 DIAGNOSIS — R7303 Prediabetes: Secondary | ICD-10-CM | POA: Diagnosis not present

## 2018-03-21 DIAGNOSIS — Z1231 Encounter for screening mammogram for malignant neoplasm of breast: Secondary | ICD-10-CM

## 2018-03-21 NOTE — Progress Notes (Signed)
Patient was seen on 03/21/18 for the Core Session 6 of Diabetes Prevention Program course at [facility]. By the end of this session patients are able to complete the following objectives:   Learning Objectives:  Graph their daily physical activity.   Describe two ways of finding the time to be active.   Define "lifestyle activity."   Describe how to prevent injury.   Develop an activity plan for the coming week.   Goals:   Record weight taken outside of class.   Track foods and beverages eaten each day in the "Food and Activity Tracker," including calories and fat grams for each item.    Track activity type, minutes you were active, and distance you reached each day in the "Food and Activity Tracker."   Set aside one 20 to 30-minute block of time every day or find two or more periods of 10 to15 minutes each for physical activity.   Warm up, cool down, and stretch.  Make a Physical Activities Plan for the Week.   Follow-Up Plan:  Attend Core Session 7 next week.   Bring completed "Food and Activity Tracker" next week to be reviewed by Lifestyle Coach.

## 2018-03-28 ENCOUNTER — Encounter: Payer: Self-pay | Admitting: Registered"

## 2018-03-28 ENCOUNTER — Encounter (HOSPITAL_BASED_OUTPATIENT_CLINIC_OR_DEPARTMENT_OTHER): Payer: Medicare Other | Admitting: Registered"

## 2018-03-28 DIAGNOSIS — R7303 Prediabetes: Secondary | ICD-10-CM

## 2018-03-28 NOTE — Progress Notes (Signed)
Patient was seen on 03/28/18 for the Core Session 7 of Diabetes Prevention Program course at [facility]. By the end of this session patients are able to complete the following objectives:   Learning Objectives:  Define calorie balance.  Explain how healthy eating and being active are related in terms of calorie balance.   Describe the relationship between calorie balance and weight loss.   Describe his or her progress as it relates to calorie balance.   Develop an activity plan for the coming week.   Goals:   Record weight taken outside of class.   Track foods and beverages eaten each day in the "Food and Activity Tracker," including calories and fat grams for each item.    Track activity type, minutes you were active, and distance you reached each day in the "Food and Activity Tracker."   Set aside one 20 to 30-minute block of time every day or find two or more periods of 10 to15 minutes each for physical activity.   Make a Physical Activities Plan for the Week.   Make active lifestyle choices all through the day   Stay at or go slightly over activity goal.   Follow-Up Plan:  Attend Core Session 8 next week.   Bring completed "Food and Activity Tracker" next week to be reviewed by Lifestyle Coach.

## 2018-04-01 DIAGNOSIS — I10 Essential (primary) hypertension: Secondary | ICD-10-CM | POA: Diagnosis not present

## 2018-04-01 DIAGNOSIS — R7303 Prediabetes: Secondary | ICD-10-CM | POA: Diagnosis not present

## 2018-04-01 DIAGNOSIS — E782 Mixed hyperlipidemia: Secondary | ICD-10-CM | POA: Diagnosis not present

## 2018-04-04 ENCOUNTER — Encounter: Payer: Self-pay | Admitting: Registered"

## 2018-04-04 ENCOUNTER — Encounter (HOSPITAL_BASED_OUTPATIENT_CLINIC_OR_DEPARTMENT_OTHER): Payer: Medicare Other | Admitting: Registered"

## 2018-04-04 DIAGNOSIS — R7303 Prediabetes: Secondary | ICD-10-CM | POA: Diagnosis not present

## 2018-04-04 NOTE — Progress Notes (Signed)
Patient was seen on 04/04/18 for the Core Session 8 of Diabetes Prevention Program course at [facility]. By the end of this session patients are able to complete the following objectives:   Learning Objectives:  Recognize positive and negative food and activity cues.   Change negative food and activity cues to positive cues.   Add positive cues for activity and eliminate cues for inactivity.   Develop a plan for removing one problem food cue for the coming week.   Goals:   Record weight taken outside of class.   Track foods and beverages eaten each day in the "Food and Activity Tracker," including calories and fat grams for each item.    Track activity type, minutes you were active, and distance you reached each day in the "Food and Activity Tracker."   Set aside one 20 to 30-minute block of time every day or find two or more periods of 10 to15 minutes each for physical activity.   Remove one problem food cue.   Add one positive cue for being more active.  Follow-Up Plan:  Attend Core Session 9 next week.   Bring completed "Food and Activity Tracker" next week to be reviewed by Lifestyle Coach.

## 2018-04-05 DIAGNOSIS — Z6834 Body mass index (BMI) 34.0-34.9, adult: Secondary | ICD-10-CM | POA: Diagnosis not present

## 2018-04-05 DIAGNOSIS — Z23 Encounter for immunization: Secondary | ICD-10-CM | POA: Diagnosis not present

## 2018-04-05 DIAGNOSIS — E782 Mixed hyperlipidemia: Secondary | ICD-10-CM | POA: Diagnosis not present

## 2018-04-05 DIAGNOSIS — I10 Essential (primary) hypertension: Secondary | ICD-10-CM | POA: Diagnosis not present

## 2018-04-05 DIAGNOSIS — R7303 Prediabetes: Secondary | ICD-10-CM | POA: Diagnosis not present

## 2018-04-05 DIAGNOSIS — E119 Type 2 diabetes mellitus without complications: Secondary | ICD-10-CM | POA: Diagnosis not present

## 2018-04-06 ENCOUNTER — Ambulatory Visit
Admission: RE | Admit: 2018-04-06 | Discharge: 2018-04-06 | Disposition: A | Discharge status: Home / Self Care | Payer: Medicare Other | Source: Ambulatory Visit | Attending: Family Medicine | Admitting: Family Medicine

## 2018-04-06 ENCOUNTER — Other Ambulatory Visit: Payer: Self-pay | Admitting: Family Medicine

## 2018-04-06 DIAGNOSIS — M170 Bilateral primary osteoarthritis of knee: Secondary | ICD-10-CM

## 2018-04-11 ENCOUNTER — Encounter: Payer: Medicare Other | Attending: Family Medicine | Admitting: Registered"

## 2018-04-11 ENCOUNTER — Encounter: Payer: Self-pay | Admitting: Registered"

## 2018-04-11 DIAGNOSIS — R7303 Prediabetes: Secondary | ICD-10-CM | POA: Diagnosis not present

## 2018-04-11 NOTE — Progress Notes (Signed)
Patient was seen on 04/11/18 for the Core Session 9 of Diabetes Prevention Program course at [facility]. By the end of this session patients are able to complete the following objectives:   Learning Objectives:  List and describe five steps to problem solving.   Apply the five problem solving steps to resolve a problem he or she has with eating less fat and fewer calories or being more active.   Goals:   Record weight taken outside of class.   Track foods and beverages eaten each day in the "Food and Activity Tracker," including calories and fat grams for each item.    Track activity type, minutes you were active, and distance you reached each day in the "Food and Activity Tracker."   Set aside one 20 to 30-minute block of time every day or find two or more periods of 10 to15 minutes each for physical activity.   Use problem solving action plan created during session to problem solve.   Follow-Up Plan:  Attend Core Session 10 next week.   Bring completed "Food and Activity Tracker" next week to be reviewed by Lifestyle Coach.  Bring menus from favorite restaurants to next session for future discussion.

## 2018-04-18 ENCOUNTER — Encounter (HOSPITAL_BASED_OUTPATIENT_CLINIC_OR_DEPARTMENT_OTHER): Payer: Medicare Other | Admitting: Registered"

## 2018-04-18 ENCOUNTER — Encounter: Payer: Self-pay | Admitting: Registered"

## 2018-04-18 DIAGNOSIS — R7303 Prediabetes: Secondary | ICD-10-CM

## 2018-04-18 NOTE — Progress Notes (Signed)
Patient was seen on 04/18/18 for the Core Session 10 of Diabetes Prevention Program course at [facility]. By the end of this session patients are able to complete the following objectives:   Learning Objectives:  List and describe the four keys for healthy eating out.   Give examples of how to apply these keys at the type of restaurants that the participants go to regularly.   Make an appropriate meal selection from a restaurant menu.   Demonstrate how to ask for a substitute item using assertive language and a polite tone of voice.    Goals:   Record weight taken outside of class.   Track foods and beverages eaten each day in the "Food and Activity Tracker," including calories and fat grams for each item.    Track activity type, minutes you were active, and distance you reached each day in the "Food and Activity Tracker."   Set aside one 20 to 30-minute block of time every day or find two or more periods of 10 to15 minutes each for physical activity.   Utilize positive action plan and complete questions on "To Do List."   Follow-Up Plan:  Attend Core Session 11 next week.   Bring completed "Food and Activity Tracker" next week to be reviewed by Lifestyle Coach.

## 2018-04-19 DIAGNOSIS — M170 Bilateral primary osteoarthritis of knee: Secondary | ICD-10-CM | POA: Diagnosis not present

## 2018-04-19 DIAGNOSIS — E782 Mixed hyperlipidemia: Secondary | ICD-10-CM | POA: Diagnosis not present

## 2018-04-19 DIAGNOSIS — Z6834 Body mass index (BMI) 34.0-34.9, adult: Secondary | ICD-10-CM | POA: Diagnosis not present

## 2018-04-19 DIAGNOSIS — I10 Essential (primary) hypertension: Secondary | ICD-10-CM | POA: Diagnosis not present

## 2018-04-25 ENCOUNTER — Encounter (HOSPITAL_BASED_OUTPATIENT_CLINIC_OR_DEPARTMENT_OTHER): Payer: Medicare Other | Admitting: Registered"

## 2018-04-25 ENCOUNTER — Encounter: Payer: Self-pay | Admitting: Registered"

## 2018-04-25 DIAGNOSIS — R7303 Prediabetes: Secondary | ICD-10-CM

## 2018-04-25 NOTE — Progress Notes (Signed)
Patient was seen on 04/25/18 for the Core Session 11 of Diabetes Prevention Program course at [facility]. By the end of this session patients are able to complete the following objectives:   Learning Objectives:  Give examples of negative thoughts that could prevent them from meeting their goals of losing weight and being more physically active.   Describe how to stop negative thoughts and talk back to them with positive thoughts.   Practice 1) stopping negative thoughts and 2) talking back to negative thoughts with positive ones.   Goals:   Record weight taken outside of class.   Track foods and beverages eaten each day in the "Food and Activity Tracker," including calories and fat grams for each item.    Track activity type, minutes you were active, and distance you reached each day in the "Food and Activity Tracker."   If you have any negative thoughts-write them in your Food and Activity Trackers, along with how you talked back to them. Practice stopping negative thoughts and talking back to them with positive thoughts.   Follow-Up Plan:  Attend Core Session 12 next week.   Bring completed "Food and Activity Tracker" next week to be reviewed by Lifestyle Coach.

## 2018-05-02 ENCOUNTER — Encounter: Payer: Self-pay | Admitting: Registered"

## 2018-05-02 ENCOUNTER — Encounter (HOSPITAL_BASED_OUTPATIENT_CLINIC_OR_DEPARTMENT_OTHER): Payer: Medicare Other | Admitting: Registered"

## 2018-05-02 DIAGNOSIS — R7303 Prediabetes: Secondary | ICD-10-CM | POA: Diagnosis not present

## 2018-05-02 NOTE — Progress Notes (Signed)
Patient was seen on 05/02/18 for the Core Session 12 of Diabetes Prevention Program course at [facility]. By the end of this session patients are able to complete the following objectives:   Learning Objectives:  Describe their current progress toward defined goals.  Describe common causes for slipping from healthy eating or being  active.  Explain what to do to get back on their feet after a slip.  Goals:   Record weight taken outside of class.   Track foods and beverages eaten each day in the "Food and Activity Tracker," including calories and fat grams for each item.    Track activity type, minutes active, and distance reached each day in the "Food and Activity Tracker."   Try out the two action plans created during session- "Slips from Healthy Eating: Action Plan" and "Slips from Being Active: Action Plan"  Answer questions on the handout.   Follow-Up Plan:  Attend Core Session 13 next week.   Bring completed "Food and Activity Tracker" next week to be reviewed by Lifestyle Coach.

## 2018-05-09 ENCOUNTER — Encounter: Payer: Medicare Other | Attending: Family Medicine | Admitting: Registered"

## 2018-05-09 ENCOUNTER — Encounter: Payer: Self-pay | Admitting: Registered"

## 2018-05-09 DIAGNOSIS — R7303 Prediabetes: Secondary | ICD-10-CM | POA: Insufficient documentation

## 2018-05-09 NOTE — Progress Notes (Signed)
Patient was seen on 05/09/18 for the Core Session 13 of Diabetes Prevention Program course at [facility]. By the end of this session patients are able to complete the following objectives:   Learning Objectives:  Describe ways to add interest and variety to their activity plans.  Define ?aerobic fitness.  Explain the four F.I.T.T. principles (frequency, intensity, time, and type of activity) and how they relate to aerobic fitness.   Goals:   Record weight taken outside of class.   Track foods and beverages eaten each day in the "Food and Activity Tracker," including calories and fat grams for each item.    Track activity type, minutes you were active, and distance you reached each day in the "Food and Activity Tracker."   Do your best to reach activity goal for the week.  Use one of the F.I.T.T. principles to jump start workouts.  Document activity level on the "To Do Next Week" handout.  Follow-Up Plan:  Attend Core Session 14 next week.   Bring completed "Food and Activity Tracker" next week to be reviewed by Lifestyle Coach.

## 2018-05-16 ENCOUNTER — Encounter: Payer: Self-pay | Admitting: Registered"

## 2018-05-16 ENCOUNTER — Encounter (HOSPITAL_BASED_OUTPATIENT_CLINIC_OR_DEPARTMENT_OTHER): Payer: Medicare Other | Admitting: Registered"

## 2018-05-16 DIAGNOSIS — R7303 Prediabetes: Secondary | ICD-10-CM

## 2018-05-16 NOTE — Progress Notes (Signed)
Patient was seen on 05/16/18 for the Core Session 14 of Diabetes Prevention Program course at [facility]. By the end of this session patients are able to complete the following objectives:   Learning Objectives:  Give examples of problem social cues and helpful social cues.   Explain how to remove problem social cues and add helpful ones.   Describe ways of coping with vacations and social events such as parties, holidays, and visits from relatives and friends.   Create an action plan to change a problem social cue and add a helpful one.   Goals:   Record weight taken outside of class.   Track foods and beverages eaten each day in the "Food and Activity Tracker," including calories and fat grams for each item.    Track activity type, minutes you were active, and distance you reached each day in the "Food and Activity Tracker."   Do your best to reach activity goal for the week.  Use action plan created during session to change a problem social cue and add a helpful social cue.   Answer questions regarding success of changing social cues on "To Do Next Week" handout.   Follow-Up Plan:  Attend Core Session 15 next week.   Bring completed "Food and Activity Tracker" next week to be reviewed by Lifestyle Coach.

## 2018-07-27 DIAGNOSIS — M170 Bilateral primary osteoarthritis of knee: Secondary | ICD-10-CM | POA: Diagnosis not present

## 2018-07-27 DIAGNOSIS — R7303 Prediabetes: Secondary | ICD-10-CM | POA: Diagnosis not present

## 2018-07-27 DIAGNOSIS — E782 Mixed hyperlipidemia: Secondary | ICD-10-CM | POA: Diagnosis not present

## 2018-07-27 DIAGNOSIS — I10 Essential (primary) hypertension: Secondary | ICD-10-CM | POA: Diagnosis not present

## 2018-08-03 DIAGNOSIS — M170 Bilateral primary osteoarthritis of knee: Secondary | ICD-10-CM | POA: Diagnosis not present

## 2018-08-10 DIAGNOSIS — M170 Bilateral primary osteoarthritis of knee: Secondary | ICD-10-CM | POA: Diagnosis not present

## 2018-08-10 DIAGNOSIS — J309 Allergic rhinitis, unspecified: Secondary | ICD-10-CM | POA: Diagnosis not present

## 2018-08-16 DIAGNOSIS — R42 Dizziness and giddiness: Secondary | ICD-10-CM | POA: Diagnosis not present

## 2018-08-16 DIAGNOSIS — I10 Essential (primary) hypertension: Secondary | ICD-10-CM | POA: Diagnosis not present

## 2018-08-16 DIAGNOSIS — J309 Allergic rhinitis, unspecified: Secondary | ICD-10-CM | POA: Diagnosis not present

## 2018-08-17 DIAGNOSIS — I10 Essential (primary) hypertension: Secondary | ICD-10-CM | POA: Diagnosis not present

## 2018-08-17 DIAGNOSIS — M170 Bilateral primary osteoarthritis of knee: Secondary | ICD-10-CM | POA: Diagnosis not present

## 2018-08-17 DIAGNOSIS — J309 Allergic rhinitis, unspecified: Secondary | ICD-10-CM | POA: Diagnosis not present

## 2018-08-30 DIAGNOSIS — J309 Allergic rhinitis, unspecified: Secondary | ICD-10-CM | POA: Diagnosis not present

## 2018-08-30 DIAGNOSIS — I10 Essential (primary) hypertension: Secondary | ICD-10-CM | POA: Diagnosis not present

## 2018-08-30 DIAGNOSIS — R42 Dizziness and giddiness: Secondary | ICD-10-CM | POA: Diagnosis not present

## 2018-10-20 DIAGNOSIS — J309 Allergic rhinitis, unspecified: Secondary | ICD-10-CM | POA: Diagnosis not present

## 2018-10-20 DIAGNOSIS — I10 Essential (primary) hypertension: Secondary | ICD-10-CM | POA: Diagnosis not present

## 2018-10-20 DIAGNOSIS — E782 Mixed hyperlipidemia: Secondary | ICD-10-CM | POA: Diagnosis not present

## 2018-11-28 DIAGNOSIS — R7303 Prediabetes: Secondary | ICD-10-CM | POA: Diagnosis not present

## 2018-11-28 DIAGNOSIS — E876 Hypokalemia: Secondary | ICD-10-CM | POA: Diagnosis not present

## 2018-11-28 DIAGNOSIS — E782 Mixed hyperlipidemia: Secondary | ICD-10-CM | POA: Diagnosis not present

## 2018-11-28 DIAGNOSIS — I10 Essential (primary) hypertension: Secondary | ICD-10-CM | POA: Diagnosis not present

## 2018-12-13 DIAGNOSIS — E782 Mixed hyperlipidemia: Secondary | ICD-10-CM | POA: Diagnosis not present

## 2018-12-13 DIAGNOSIS — I10 Essential (primary) hypertension: Secondary | ICD-10-CM | POA: Diagnosis not present

## 2018-12-13 DIAGNOSIS — R7303 Prediabetes: Secondary | ICD-10-CM | POA: Diagnosis not present

## 2018-12-22 DIAGNOSIS — Z23 Encounter for immunization: Secondary | ICD-10-CM | POA: Diagnosis not present

## 2019-01-13 DIAGNOSIS — I10 Essential (primary) hypertension: Secondary | ICD-10-CM | POA: Diagnosis not present

## 2019-01-13 DIAGNOSIS — R42 Dizziness and giddiness: Secondary | ICD-10-CM | POA: Diagnosis not present

## 2019-01-13 DIAGNOSIS — Z0000 Encounter for general adult medical examination without abnormal findings: Secondary | ICD-10-CM | POA: Diagnosis not present

## 2019-01-13 DIAGNOSIS — M170 Bilateral primary osteoarthritis of knee: Secondary | ICD-10-CM | POA: Diagnosis not present

## 2019-01-13 DIAGNOSIS — J309 Allergic rhinitis, unspecified: Secondary | ICD-10-CM | POA: Diagnosis not present

## 2019-05-17 ENCOUNTER — Other Ambulatory Visit: Payer: Self-pay | Admitting: Family Medicine

## 2019-05-17 DIAGNOSIS — R29898 Other symptoms and signs involving the musculoskeletal system: Secondary | ICD-10-CM

## 2019-05-17 DIAGNOSIS — R269 Unspecified abnormalities of gait and mobility: Secondary | ICD-10-CM

## 2019-06-07 ENCOUNTER — Encounter: Payer: Self-pay | Admitting: Neurology

## 2019-06-08 ENCOUNTER — Other Ambulatory Visit: Payer: Self-pay

## 2019-06-08 ENCOUNTER — Ambulatory Visit (INDEPENDENT_AMBULATORY_CARE_PROVIDER_SITE_OTHER): Payer: Medicare Other | Admitting: Neurology

## 2019-06-08 ENCOUNTER — Encounter: Payer: Self-pay | Admitting: Neurology

## 2019-06-08 VITALS — BP 156/97 | HR 106 | Ht 63.0 in | Wt 193.0 lb

## 2019-06-08 DIAGNOSIS — R269 Unspecified abnormalities of gait and mobility: Secondary | ICD-10-CM | POA: Diagnosis not present

## 2019-06-08 NOTE — Patient Instructions (Signed)
I do not have a good explanation for your gait insecurity.  Nevertheless, as you are not walking very steadily, please start using a walker, especially when you go outside, for gait safety.  We will do blood work today and also schedule you for electrical nerve and muscle testing called EMG and nerve conduction velocity testing to look at any obvious causes of nerve damage or muscle disease or pinched nerve type findings from the lower back.  I doubt that you have any obvious lower back issue but we can certainly consider a lumbar spine or lower back MRI.  You are scheduled for a brain MRI.  Please ask your primary care physician to send me a copy of the report.  We will call you with the test results and follow-up with either additional testing or as needed in this office.

## 2019-06-08 NOTE — Progress Notes (Signed)
Subjective:    Patient ID: Elaine Burke is a 69 y.o. female.  HPI     Elaine Age, MD, PhD [facility] [address], [address]O. [place], [place] [address]  Dear Elaine Burke,  I saw your patient, Elaine Burke, upon your kind request in my neurologic clinic today for initial consultation of her gait disorder.  The patient is accompanied by her daughter today.  As you know, Elaine Burke is a 69 year old right-handed woman with an underlying medical history of prediabetes, hypertension, allergies and obesity, who reports a 1+ year history of difficulty walking.  She feels off balance at times, sometimes she feels like she staggers, sometimes she feels weaker in the legs.  She denies any pain, numbness or tingling, radiating back pain or mid back pain, no burning sensation in the feet, denies sudden onset of one-sided weakness or numbness or tingling or droopy face or slurring of speech.  She has seen Dr. Alvan Burke because of her symptoms.  She has a history of bilateral knee pain and arthritis.  Dr. Alvan Burke did not see any obvious orthopedic cause of her gait disorder.  She is scheduled for a brain MRI as ordered by you tomorrow.  She had some blood work through your office recently.  I was not able to review the blood test results.  I reviewed your office note. She denies a family history of neuromuscular diseases such as neuropathy or muscular diseases.  She has not fallen.  Sometimes she holds onto things at home when she walks.  She has had physical therapy twice, last time over a year ago.  She has not been advised to start using a cane or a walker. She denies bowel or bladder dysfunction.  She does not hydrate very well, estimates that she drinks about 2 to 3 cups of water per day, 1 glass of tea per day, no coffee, 1 small soda can, 8 ounce.  She does not smoke, she does not drink any alcohol.  She has no history of alcohol use disorder.   Her Past  Medical History Is Significant For: Past Medical History:  Diagnosis Date  . Arthritis   . Arthritis   . Diabetes mellitus without complication ([place])   . HLD (hyperlipidemia)   . Hypertension     Her Past Surgical History Is Significant For: Past Surgical History:  Procedure Laterality Date  . ABDOMINAL HYSTERECTOMY    . CESAREAN SECTION    . COLONOSCOPY      Her Family History Is Significant For: Family History  Problem Relation Burke of Onset  . Breast cancer Paternal Aunt     Her Social History Is Significant For: Social History   Socioeconomic History  . Marital status: Married    Spouse name: Not on file  . Number of children: Not on file  . Years of education: Not on file  . Highest education level: Not on file  Occupational History  . Not on file  Tobacco Use  . Smoking status: Never Smoker  . Smokeless tobacco: Never Used  Substance and Sexual Activity  . Alcohol use: No  . Drug use: No  . Sexual activity: Not on file  Other Topics Concern  . Not on file  Social History Narrative  . Not on file   Social Determinants of Health   Financial Resource Strain:   . Difficulty of Paying Living Expenses:   Food Insecurity:   . Worried About [employer] of  Food in the Last Year:   . [place] in the Last Year:   Transportation Needs:   . Film/video editor (Medical):   Marland Kitchen Lack of Transportation (Non-Medical):   Physical Activity:   . Days of Exercise per Week:   . Minutes of Exercise per Session:   Stress:   . Feeling of Stress :   Social Connections:   . Frequency of Communication with Friends and Family:   . Frequency of Social Gatherings with Friends and Family:   . Attends Religious Services:   . Active Member of Clubs or Organizations:   . Attends Archivist Meetings:   Marland Kitchen Marital Status:     Her Allergies Are:  Allergies  Allergen Reactions  . Shellfish Allergy Swelling  . Shellfish Allergy Swelling  . Lisinopril Other  (See Comments)    MD said affects kidneys. No take   . Penicillins Hives, Itching and Swelling    Has patient had a PCN reaction causing immediate rash, facial/tongue/throat swelling, SOB or lightheadedness with hypotension: No Has patient had a PCN reaction causing severe rash involving mucus membranes or skin necrosis: No Has patient had a PCN reaction that required hospitalization: No Has patient had a PCN reaction occurring within the last 10 years: No If all of the above answers are "NO", then may proceed with Cephalosporin use.   :   Her Current Medications Are:  Outpatient Encounter Medications as of 06/08/2019  Medication Sig  . amLODipine (NORVASC) 10 MG tablet Take 1 tablet by mouth daily.  Marland Kitchen loratadine (CLARITIN) 10 MG tablet Take 10 mg by mouth daily.  . valsartan-hydrochlorothiazide (DIOVAN-HCT) 320-25 MG tablet Take 1 tablet by mouth daily.  . [DISCONTINUED] diclofenac Sodium (VOLTAREN) 1 % GEL Apply 4 g topically 4 (four) times daily.  . [DISCONTINUED] losartan (COZAAR) 50 MG tablet Take 1 tablet by mouth daily.  . [DISCONTINUED] metoprolol succinate (TOPROL-XL) 50 MG 24 hr tablet Take 1 tablet by mouth daily.  . [DISCONTINUED] rosuvastatin (CRESTOR) 5 MG tablet Take 5 mg by mouth daily.  . [DISCONTINUED] triamterene-hydrochlorothiazide (DYAZIDE) 37.5-25 MG capsule Take 1 capsule by mouth daily.   No facility-administered encounter medications on file as of 06/08/2019.  :   Review of Systems:  Out of a complete 14 point review of systems, all are reviewed and negative with the exception of these symptoms as listed below:  Review of Systems  Neurological:       Pt presents today to discuss her gait. Pt's gait worsened about one year ago. She has not had any falls.    Objective:  Neurological Exam  Physical Exam Physical Examination:   Vitals:   06/08/19 1024  BP: (!) 156/97  Pulse: (!) 106    General Examination: The patient is a very pleasant 69 y.o. female  in no acute distress. She appears well-developed and well-nourished and well groomed.   HEENT: Normocephalic, atraumatic, pupils are equal, round and reactive to light and accommodation. Funduscopic exam is normal on the right with status post cataract removal on the right, left-sided funduscopy is difficult, left-sided cataract is noted.  Extraocular tracking is good without limitation to gaze excursion or nystagmus noted. Normal smooth pursuit is noted. Hearing is grossly intact. Face is symmetric with normal facial animation and normal facial sensation. Speech is clear with no dysarthria noted. There is no hypophonia. There is no lip, neck/head, jaw or voice tremor. Neck is supple with full range of passive and active motion.  There are no carotid bruits on auscultation. Oropharynx exam reveals: moderate mouth dryness, adequate dental hygiene. Tongue protrudes centrally and palate elevates symmetrically.   Chest: Clear to auscultation without wheezing, rhonchi or crackles noted.  Heart: S1+S2+0, regular and normal without murmurs, rubs or gallops noted.   Abdomen: Soft, non-tender and non-distended with normal bowel sounds appreciated on auscultation.  Extremities: There is trace pitting edema in the distal lower extremities bilaterally. Pedal pulses are intact.  Skin: Warm and dry without trophic changes noted.  Musculoskeletal: exam reveals knee pain b/l.   Neurologically:  Mental status: The patient is awake, alert and oriented in all 4 spheres. Her immediate and remote memory, attention, language skills and fund of knowledge are appropriate. There is no evidence of aphasia, agnosia, apraxia or anomia. Speech is clear with normal prosody and enunciation. Thought process is linear. Mood is normal and affect is normal.  Cranial nerves II - XII are as described above under HEENT exam. In addition: shoulder shrug is normal with equal shoulder height noted. Motor exam: Normal bulk, strength and  tone is noted. There is no drift, tremor or rebound. Romberg is negative and can stand narrow based. Reflexes are 1+ in the upper extremities and trace in the lower extremities, toes are downgoing.  Fine motor skills and coordination: intact with normal finger taps, normal hand movements, normal rapid alternating patting, normal foot taps and normal foot agility.  Cerebellar testing: No dysmetria or intention tremor on finger to nose testing. Heel to shin is unremarkable bilaterally. There is no truncal or gait ataxia.  Sensory exam: intact to light touch, vibration, temperature sense in the upper and lower extremities.  Gait, station and balance: She stands stands with difficulty and stands slightly wide-based initially.  She walks without a walking aid, she has a nonspecific unsteadiness while walking, no particular difficulty turning but does have a tendency to hold onto the exam table while turning.  She has no shuffling, she has preserved arm swing.  No antalgic gait.  No magnetic gait.   Assessment and plan:   In summary, Elaine Burke is a very pleasant 69 y.o.-year old female with an underlying medical history of prediabetes, hypertension, allergies and obesity, who presents for evaluation of her gait disorder of approximately 1+ years duration.  She has had 2 rounds of physical therapy she reports.  On examination, she has a nonspecific gait instability, not particularly limited by pain, no signs of cerebellar ataxia, no parkinsonism.  We talked about several different possibilities.  I do not believe that she has lumbar spine related issues, she denies any significant back pain and in particular she denies any sciatica type symptoms.  She does not hydrate very well.  We talked about the importance of good hydration.  She is advised to keep her appointment for the brain MRI that has been scheduled through your office.  We will proceed with blood work through our office and EMG and nerve  conduction velocity testing to look for obvious signs of neuropathy, myopathy, or radiculopathy.  We may follow up with a lumbar spine MRI if need be.  For now, she is advised to start using a walker for gait safety.  We will call her with her test results, we will do blood work today.  I answered all their questions today and the patient and her daughter were in agreement.  Thank you very much for allowing me to participate in the care of this nice patient. If I  can be of any further assistance to you please do not hesitate to call me at [phone].  Sincerely,   Elaine Age, MD, PhD

## 2019-06-09 ENCOUNTER — Other Ambulatory Visit: Payer: Self-pay

## 2019-06-09 ENCOUNTER — Ambulatory Visit
Admission: RE | Admit: 2019-06-09 | Discharge: 2019-06-09 | Disposition: A | Discharge status: Home / Self Care | Payer: Medicare Other | Source: Ambulatory Visit | Attending: Family Medicine | Admitting: Family Medicine

## 2019-06-09 DIAGNOSIS — R29898 Other symptoms and signs involving the musculoskeletal system: Secondary | ICD-10-CM

## 2019-06-09 DIAGNOSIS — R269 Unspecified abnormalities of gait and mobility: Secondary | ICD-10-CM

## 2019-06-09 MED ORDER — GADOBENATE DIMEGLUMINE 529 MG/ML IV SOLN
17.0000 mL | Freq: Once | INTRAVENOUS | Status: AC | PRN
  Administered 2019-06-09: 17 mL via INTRAVENOUS

## 2019-06-12 LAB — MULTIPLE MYELOMA PANEL, SERUM
Albumin SerPl Elph-Mcnc: 3.9 g/dL (ref 2.9–4.4)
Albumin/Glob SerPl: 1 (ref 0.7–1.7)
Alpha 1: 0.2 g/dL (ref 0.0–0.4)
Alpha2 Glob SerPl Elph-Mcnc: 0.7 g/dL (ref 0.4–1.0)
B-Globulin SerPl Elph-Mcnc: 1.4 g/dL — ABNORMAL HIGH (ref 0.7–1.3)
Gamma Glob SerPl Elph-Mcnc: 1.8 g/dL (ref 0.4–1.8)
Globulin, Total: 4.1 g/dL — ABNORMAL HIGH (ref 2.2–3.9)
IgA/Immunoglobulin A, Serum: 373 mg/dL — ABNORMAL HIGH (ref 87–352)
IgG (Immunoglobin G), Serum: 1864 mg/dL — ABNORMAL HIGH (ref 586–1602)
IgM (Immunoglobulin M), Srm: 120 mg/dL (ref 26–217)
Total Protein: 8 g/dL (ref 6.0–8.5)

## 2019-06-12 LAB — HEAVY METALS PROFILE II, BLOOD
Arsenic: 6 ug/L (ref 2–23)
Cadmium: <0.5 ug/L (ref 0.0–1.2)
Lead, Blood: <1 ug/dL (ref 0–4)
Mercury: <1 ug/L (ref 0.0–14.9)

## 2019-06-12 LAB — RHEUMATOID FACTOR: Rheumatoid fact SerPl-aCnc: <10 IU/mL (ref 0.0–13.9)

## 2019-06-12 LAB — ANA W/REFLEX: Anti Nuclear Antibody (ANA): NEGATIVE

## 2019-06-12 LAB — RPR: RPR Ser Ql: NONREACTIVE

## 2019-06-12 LAB — C-REACTIVE PROTEIN: CRP: 8 mg/L (ref 0–10)

## 2019-06-12 LAB — TSH: TSH: 2.07 u[IU]/mL (ref 0.450–4.500)

## 2019-06-12 LAB — CK: Total CK: 119 U/L (ref 32–182)

## 2019-06-13 ENCOUNTER — Telehealth: Payer: Self-pay

## 2019-06-13 NOTE — Telephone Encounter (Signed)
-----   Message from Star Age, MD sent at 06/13/2019 10:16 AM EDT ----- Labs normal with the exception of an abnormality noted in her protein electrophoresis, meaning the protein breakdown of her blood, with increase in antibodies noted.  This is a nonspecific finding and most likely an incidental finding.  Nevertheless, we may need to repeat the test and consider input from a hematologist if need be.  We may want to wait out the EMG and nerve conduction velocity test scheduled for later this month.  Please notify patient.

## 2019-06-13 NOTE — Telephone Encounter (Signed)
I called pt and discussed her results and recommendations. Pt will wait for her NCV/NCV test results.  Pt received results from her MRI from Dr. Ernie Hew and wanted Dr. Rexene Alberts to be aware they are available for review. Pt reports that Dr. Ernie Hew recommended a repeat MRI brain.  Pt verbalized understanding of results. Pt had no questions at this time but was encouraged to call back if questions arise.

## 2019-06-13 NOTE — Telephone Encounter (Signed)
I called pt again, discussed this with her. She will repeat the MRI per Dr. Ival Bible recommendations. Pt verbalized appreciation of the call back.

## 2019-06-13 NOTE — Progress Notes (Signed)
Labs normal with the exception of an abnormality noted in her protein electrophoresis, meaning the protein breakdown of her blood, with increase in antibodies noted.  This is a nonspecific finding and most likely an incidental finding.  Nevertheless, we may need to repeat the test and consider input from a hematologist if need be.  We may want to wait out the EMG and nerve conduction velocity test scheduled for later this month.  Please notify patient.

## 2019-06-13 NOTE — Telephone Encounter (Signed)
I reviewed her brain MRI report.  There was an addendum made to the results indicating that there may be a pituitary adenoma.  I agree with Dr. Ival Bible decision of repeating her MRI with a designated pituitary MRI protocol with and without contrast.

## 2019-06-14 ENCOUNTER — Other Ambulatory Visit: Payer: Self-pay | Admitting: Family Medicine

## 2019-06-16 ENCOUNTER — Other Ambulatory Visit: Payer: Self-pay | Admitting: Family Medicine

## 2019-06-16 DIAGNOSIS — E236 Other disorders of pituitary gland: Secondary | ICD-10-CM

## 2019-06-26 ENCOUNTER — Other Ambulatory Visit: Payer: Self-pay

## 2019-06-26 ENCOUNTER — Ambulatory Visit
Admission: RE | Admit: 2019-06-26 | Discharge: 2019-06-26 | Disposition: A | Discharge status: Home / Self Care | Payer: Medicare Other | Source: Ambulatory Visit | Attending: Family Medicine | Admitting: Family Medicine

## 2019-06-26 DIAGNOSIS — E236 Other disorders of pituitary gland: Secondary | ICD-10-CM

## 2019-06-26 MED ORDER — GADOBENATE DIMEGLUMINE 529 MG/ML IV SOLN
9.0000 mL | Freq: Once | INTRAVENOUS | Status: AC | PRN
  Administered 2019-06-26: 9 mL via INTRAVENOUS

## 2019-07-06 ENCOUNTER — Encounter (INDEPENDENT_AMBULATORY_CARE_PROVIDER_SITE_OTHER): Payer: Medicare Other | Admitting: Diagnostic Neuroimaging

## 2019-07-06 ENCOUNTER — Ambulatory Visit (INDEPENDENT_AMBULATORY_CARE_PROVIDER_SITE_OTHER): Payer: Medicare Other | Admitting: Diagnostic Neuroimaging

## 2019-07-06 ENCOUNTER — Other Ambulatory Visit: Payer: Self-pay

## 2019-07-06 DIAGNOSIS — R262 Difficulty in walking, not elsewhere classified: Secondary | ICD-10-CM

## 2019-07-06 DIAGNOSIS — Z0289 Encounter for other administrative examinations: Secondary | ICD-10-CM

## 2019-07-06 DIAGNOSIS — R269 Unspecified abnormalities of gait and mobility: Secondary | ICD-10-CM

## 2019-07-07 NOTE — Progress Notes (Signed)
Please call and advise the patient that the recent EMG and nerve conduction velocity test, which is the electrical nerve and muscle test we we performed, was reported as within normal limits. We checked for abnormal electrical discharges in the muscles or nerves and the report suggested normal findings. No further action is required on this test at this time.  For the non-specific increase in the protein in her blood, we can have her FU with primary care and consider repeating the so called serum Protein electrophoresis in about 2-3 months with PCP, if she is agreeable. Please let pt know.  I would suggest, at this point, she can FU with her PCP. Star Age, MD, PhD

## 2019-07-07 NOTE — Procedures (Signed)
[facility]  NCS (NERVE CONDUCTION STUDY) WITH EMG (ELECTROMYOGRAPHY) REPORT   STUDY DATE: 07/06/19 PATIENT NAME: Elaine Burke DOB: [birth year] MRN: [id number]  ORDERING CLINICIAN: Star Age, MD PhD   TECHNOLOGIST: Sherre Scarlet ELECTROMYOGRAPHER: Earlean Polka. Esparanza Krider, MD  CLINICAL INFORMATION: 69 year old female with lower extremity weakness.  FINDINGS: NERVE CONDUCTION STUDY: Bilateral peroneal and tibial motor responses are normal.  Bilateral sural and superficial peroneal sensory responses are normal.  Bilateral tibial F wave latencies are normal.   NEEDLE ELECTROMYOGRAPHY:  Needle examination of right lower extremity vastus medialis, tibialis anterior, gastrocnemius, iliopsoas, gluteus medius and right lumbar paraspinal muscles is normal.   IMPRESSION:   Normal study.  No electrodiagnostic evidence of large fiber neuropathy, myopathy or lumbar radiculopathies time    INTERPRETING PHYSICIAN:  Penni Bombard, MD Certified in Neurology, Neurophysiology and Neuroimaging  [facility] [address] [place], [place] [address] [phone]  [employer]    Nerve / Sites Muscle Latency Ref. Amplitude Ref. Rel Amp Segments Distance Velocity Ref. Area    ms ms mV mV %  cm m/s m/s mVms  R Peroneal - EDB     Ankle EDB 4.0 ?6.5 6.7 ?2.0 100 Ankle - EDB 9   19.6     Fib head EDB 9.6  6.2  92.3 Fib head - Ankle 27 48 ?44 18.5     Pop fossa EDB 11.6  6.0  96.7 Pop fossa - Fib head 10 50 ?44 18.1         Pop fossa - Ankle      L Peroneal - EDB     Ankle EDB 4.5 ?6.5 4.7 ?2.0 100 Ankle - EDB 9   14.2     Fib head EDB 9.8  4.3  92.1 Fib head - Ankle 26 49 ?44 13.2     Pop fossa EDB 12.0  4.3  97.9 Pop fossa - Fib head 10 45 ?44 12.8         Pop fossa - Ankle      R Tibial - AH     Ankle AH 4.9 ?5.8 13.0 ?4.0 100 Ankle - AH 9   28.3     Pop fossa AH 12.5  10.5  80.9 Pop fossa - Ankle 35 46 ?41 25.8  L Tibial - AH   Ankle AH 5.2 ?5.8 9.2 ?4.0 100 Ankle - AH 9   18.8     Pop fossa AH 12.3  7.1  76.8 Pop fossa - Ankle 35 49 ?41 19.2             SNC    Nerve / Sites Rec. Site Peak Lat Ref.  Amp Ref. Segments Distance    ms ms V V  cm  R Sural - Ankle (Calf)     Calf Ankle 3.4 ?4.4 22 ?6 Calf - Ankle 14  L Sural - Ankle (Calf)     Calf Ankle 3.2 ?4.4 22 ?6 Calf - Ankle 14  R Superficial peroneal - Ankle     Lat leg Ankle 4.2 ?4.4 9 ?6 Lat leg - Ankle 14  L Superficial peroneal - Ankle     Lat leg Ankle 3.8 ?4.4 7 ?6 Lat leg - Ankle 14             F  Wave    Nerve F Lat Ref.   ms ms  R Tibial - AH 49.2 ?56.0  L Tibial - AH 49.2 ?  56.0         EMG Summary Table    Spontaneous MUAP Recruitment  Muscle IA Fib PSW Fasc Other Amp Dur. Poly Pattern  R. Vastus medialis Normal None None None _______ Normal Normal Normal Normal  R. Tibialis anterior Normal None None None _______ Normal Normal Normal Normal  R. Gastrocnemius (Medial head) Normal None None None _______ Normal Normal Normal Normal  R. Iliopsoas Normal None None None _______ Normal Normal Normal Normal  R. Gluteus medius Normal None None None _______ Normal Normal Normal Normal  R. Lumbar paraspinals Normal None None None _______ Normal Normal Normal Normal

## 2019-07-10 ENCOUNTER — Telehealth: Payer: Self-pay

## 2019-07-10 NOTE — Telephone Encounter (Signed)
-----   Message from Star Age, MD sent at 07/07/2019  1:01 PM EDT ----- Please call and advise the patient that the recent EMG and nerve conduction velocity test, which is the electrical nerve and muscle test we we performed, was reported as within normal limits. We checked for abnormal electrical discharges in the muscles or nerves and the report suggested normal findings. No further action is required on this test at this time.  For the non-specific increase in the protein in her blood, we can have her FU with primary care and consider repeating the so called serum Protein electrophoresis in about 2-3 months with PCP, if she is agreeable. Please let pt know.  I would suggest, at this point, she can FU with her PCP. Star Age, MD, PhD

## 2019-07-10 NOTE — Telephone Encounter (Signed)
I reached out to the Elaine Burke and advised of results. Elaine Burke verbalized understanding on results and was ok with having PCP repeat blood test. Elaine Burke declined to cancel f/u on 07/13/2019 and sts Elaine Burke would like to have the appt as scheduled with Dr. Rexene Alberts.

## 2019-07-13 ENCOUNTER — Ambulatory Visit (INDEPENDENT_AMBULATORY_CARE_PROVIDER_SITE_OTHER): Payer: Medicare Other | Admitting: Neurology

## 2019-07-13 ENCOUNTER — Other Ambulatory Visit: Payer: Self-pay

## 2019-07-13 ENCOUNTER — Encounter: Payer: Self-pay | Admitting: Neurology

## 2019-07-13 VITALS — BP 132/86 | HR 110 | Temp 97.5°F | Ht 63.0 in | Wt 192.3 lb

## 2019-07-13 DIAGNOSIS — D352 Benign neoplasm of pituitary gland: Secondary | ICD-10-CM

## 2019-07-13 DIAGNOSIS — R269 Unspecified abnormalities of gait and mobility: Secondary | ICD-10-CM | POA: Diagnosis not present

## 2019-07-13 NOTE — Patient Instructions (Addendum)
For your pituitary adenoma, I would like to make several referrals including to neurosurgery, endocrinology which is a hormone specialist, as well as an ophthalmologist.  You have not seen an eye doctor in over 3 years since you moved from [place], it is important that you have a formal eye examination done.  From the gait disorder standpoint, please talk to Dr. Ernie Hew about seeing a spine specialist as this has been recommended to you from your orthopedic surgeon, Dr. Alvan Dame, as I understand.  You may benefit from seeing an orthopedic spine specialist or neurosurgeon spine specialist, please talk to your primary care physician about a referral. In the meantime, please continue to use your walker for gait safety at all times. Please follow up with Dr. Ernie Hew.

## 2019-07-13 NOTE — Progress Notes (Signed)
Subjective:    Patient ID: Elaine Burke is a 69 y.o. female.  HPI     Interim history:   [name] is a 69 year old right-handed woman with an underlying medical history of prediabetes, hypertension, allergies and obesity, who Presents for evaluation of her pituitary adenoma. She is here with her daughter. She is referred by her primary care physician, Elaine Burke for the brain tumor evaluation.  I have previously seen the patient recently on 06/08/2019 for Her balance issues.  We did work-up for neuropathy with EMG nerve conduction testing which was benign, blood work was largely benign, she had mild abnormality in her protein electrophoresis.  She was advised to follow-up with her primary care physician.  She had a recent brain MRI with and without contrast which showed an enlarged pituitary gland and he was advised to repeat her MRI with special attention to the pituitary gland.  She had a repeat MRI with and without contrast to evaluate for pituitary adenoma, she had this on 06/26/2019 and I reviewed the results: IMPRESSION: 1. Diffusely enlarged and heterogeneously enhancing pituitary gland favored to reflect a 1.5 cm macroadenoma. Mild suprasellar extension without mass effect on the optic apparatus. 2. Otherwise unchanged and largely unremarkable appearance of the brain for age.  Today, 07/13/2019: She reports no new symptoms, in particular, denies any visual disturbances, no double vision, no loss of vision, no blurry vision.  Of note, she has not seen an eye doctor in over 3 years since she moved from [place].  She had right cataract surgery and was told that the left was not ready.  She has not fallen, uses her rolling walker.  She has bilateral knee pain.  She denies any back pain or radiating pain from the back.  She has not seen neurosurgery or endocrinology, no referrals have been made.  We talked about her test results, her primary care physician did do a repeat  brain MRI with special evaluation of her pituitary gland which reveals a likely macroadenoma, patient is advised that this benign tumor can grow and cause symptoms.  Previously:   06/08/19: (She) reports a 1+ year history of difficulty walking.  She feels off balance at times, sometimes she feels like she staggers, sometimes she feels weaker in the legs.  She denies any pain, numbness or tingling, radiating back pain or mid back pain, no burning sensation in the feet, denies sudden onset of one-sided weakness or numbness or tingling or droopy face or slurring of speech.  She has seen Dr. Alvan Burke because of her symptoms.  She has a history of bilateral knee pain and arthritis.  Dr. Alvan Burke did not see any obvious orthopedic cause of her gait disorder.  She is scheduled for a brain MRI as ordered by you tomorrow.  She had some blood work through your office recently.  I was not able to review the blood test results.  I reviewed your office note. She denies a family history of neuromuscular diseases such as neuropathy or muscular diseases.  She has not fallen.  Sometimes she holds onto things at home when she walks.  She has had physical therapy twice, last time over a year ago.  She has not been advised to start using a cane or a walker. She denies bowel or bladder dysfunction.  She does not hydrate very well, estimates that she drinks about 2 to 3 cups of water per day, 1 glass of tea per day, no coffee, 1 small soda  can, 8 ounce.  She does not smoke, she does not drink any alcohol.  She has no history of alcohol use disorder.  Her Past Medical History Is Significant For: Past Medical History:  Diagnosis Date  . Arthritis   . Arthritis   . Diabetes mellitus without complication ([place])   . HLD (hyperlipidemia)   . Hypertension     Her Past Surgical History Is Significant For: Past Surgical History:  Procedure Laterality Date  . ABDOMINAL HYSTERECTOMY    . CESAREAN SECTION    . COLONOSCOPY      Her  Family History Is Significant For: Family History  Problem Relation Age of Onset  . Breast cancer Paternal Aunt     Her Social History Is Significant For: Social History   Socioeconomic History  . Marital status: Married    Spouse name: Not on file  . Number of children: Not on file  . Years of education: Not on file  . Highest education level: Not on file  Occupational History  . Not on file  Tobacco Use  . Smoking status: Never Smoker  . Smokeless tobacco: Never Used  Substance and Sexual Activity  . Alcohol use: No  . Drug use: No  . Sexual activity: Not on file  Other Topics Concern  . Not on file  Social History Narrative  . Not on file   Social Determinants of Health   Financial Resource Strain:   . Difficulty of Paying Living Expenses:   Food Insecurity:   . Worried About Charity fundraiser in the Last Year:   . Arboriculturist in the Last Year:   Transportation Needs:   . Film/video editor (Medical):   Marland Kitchen Lack of Transportation (Non-Medical):   Physical Activity:   . Days of Exercise per Week:   . Minutes of Exercise per Session:   Stress:   . Feeling of Stress :   Social Connections:   . Frequency of Communication with Friends and Family:   . Frequency of Social Gatherings with Friends and Family:   . Attends Religious Services:   . Active Member of Clubs or Organizations:   . Attends Archivist Meetings:   Marland Kitchen Marital Status:     Her Allergies Are:  Allergies  Allergen Reactions  . Shellfish Allergy Swelling  . Shellfish Allergy Swelling  . Lisinopril Other (See Comments)    MD said affects kidneys. No take   . Penicillins Hives, Itching and Swelling    Has patient had a PCN reaction causing immediate rash, facial/tongue/throat swelling, SOB or lightheadedness with hypotension: No Has patient had a PCN reaction causing severe rash involving mucus membranes or skin necrosis: No Has patient had a PCN reaction that required  hospitalization: No Has patient had a PCN reaction occurring within the last 10 years: No If all of the above answers are "NO", then may proceed with Cephalosporin use.   :   Her Current Medications Are:  Outpatient Encounter Medications as of 07/13/2019  Medication Sig  . amLODipine (NORVASC) 10 MG tablet Take 1 tablet by mouth daily.  Marland Kitchen CALCIUM PO Take by mouth.  . loratadine (CLARITIN) 10 MG tablet Take 10 mg by mouth daily.  . potassium chloride (KLOR-CON) 10 MEQ tablet Take 10 mEq by mouth daily.  . rosuvastatin (CRESTOR) 5 MG tablet Take 5 mg by mouth daily.  . valsartan-hydrochlorothiazide (DIOVAN-HCT) 320-25 MG tablet Take 1 tablet by mouth daily.   No facility-administered encounter  medications on file as of 07/13/2019.  :  Review of Systems:  Out of a complete 14 point review of systems, all are reviewed and negative with the exception of these symptoms as listed below: Review of Systems  Neurological:       Here to f/u on recent MRI from Dr. Willette Pa.  Pt reports her gait has not improved since her last visit either.     Objective:  Neurological Exam  Physical Exam Physical Examination:   Vitals:   07/13/19 1305  BP: 132/86  Pulse: (!) 110  Temp: (!) 97.5 F (36.4 C)  SpO2: 99%    General Examination: The patient is a very pleasant 69 y.o. female in no acute distress. She appears well-developed and well-nourished and well groomed.   HEENT: Normocephalic, atraumatic, pupils are equal, round and reactive to light and accommodation. Funduscopic exam is normal on the right with status post cataract removal on the right, left-sided funduscopy is difficult, left-sided cataract is noted.  Extraocular tracking is good without limitation to gaze excursion or nystagmus noted. Normal smooth pursuit is noted. Hearing is grossly intact. Face is symmetric with normal facial animation and normal facial sensation. Speech is clear with no dysarthria noted. There is no hypophonia. There  is no lip, neck/head, jaw or voice tremor. Neck is supple with full range of passive and active motion. There are no carotid bruits on auscultation. Oropharynx exam reveals: moderate mouth dryness is again noted, adequate dental hygiene. Tongue protrudes centrally and palate elevates symmetrically.   Chest: Clear to auscultation without wheezing, rhonchi or crackles noted.  Heart: S1+S2+0, regular and normal without murmurs, rubs or gallops noted.   Abdomen: Soft, non-tender and non-distended with normal bowel sounds appreciated on auscultation.  Extremities: There is trace pitting edema in the distal lower extremities bilaterally.  Skin: Warm and dry without trophic changes noted.  Musculoskeletal: exam reveals knee pain b/l. denies back pain.   Neurologically:  Mental status: The patient is awake, alert and oriented in all 4 spheres. Her immediate and remote memory, attention, language skills and fund of knowledge are appropriate. There is no evidence of aphasia, agnosia, apraxia or anomia. Speech is clear with normal prosody and enunciation. Thought process is linear. Mood is normal and affect is normal.  Cranial nerves II - XII are as described above under HEENT exam.  Motor exam: Normal bulk, strength and tone is noted. There is no drift, tremor or rebound. Fine motor skills and coordination: grossly intact.  Cerebellar testing: No dysmetria or intention tremor. There is no truncal or gait ataxia.  Sensory exam: intact to light touch in the upper and lower extremities.  Gait, station and balance: She stands stands with mild difficulty and stands slightly wide-based.  She walks with a rolling walker, slowly, no shuffling, no antalgic gait, no magnetic gait.   Assessment and plan:   In summary, Elaine Burke is a very pleasant 69 year old female with an underlying medical history of prediabetes, hypertension, allergies and obesity, who presents for evaluation of a  pituitary adenoma which was found by MRI scanning in April 2021.  She has a history of gait disorder.  She has had no new symptoms, work-up from my end of things was nonrevealing, had mild polyclonal increase in her globulins on SPEP, otherwise CK level was normal, EMG and nerve conduction velocity testing was benign, no evidence of radiculopathy.  She denies any back pain.  She does have ongoing issues with bilateral knee pain.  She is using a rolling walker and is advised to continue with her walker.  If she has any back pain or radiating low back pain, she may benefit from seeing a spine specialist through orthopedics or neurosurgery.  She is encouraged to talk to her primary care physician about this.  She is advised that for evaluation of her pituitary adenoma she would benefit from consultation with endocrinology.  She is furthermore advised to seek consultation with neurosurgery and establish with an ophthalmologist as she has not seen an eye doctor in over 3 years since she moved from [place].  She has a history of cataracts.  She denies any visual symptoms or headaches thankfully.  From the neurological standpoint, she is advised to follow-up as needed.  She is advised to make a follow-up appointment with her primary care physician to discuss referral to a spine specialist if needed.  She is advised that I have made 3 referrals today and she should hear from the offices directly.  If she has not heard about the referrals in the next 2 weeks, she is advised to call our office so we can see what the hold-up is.  I answered all their questions today and the patient and her daughter were in agreement.   I spent 30 minutes in total face-to-face time and in reviewing records during pre-charting, more than 50% of which was spent in counseling and coordination of care, reviewing test results, reviewing medications and treatment regimen and/or in discussing or reviewing the diagnosis of Pit adenoma, the prognosis  and treatment options. Pertinent laboratory and imaging test results that were available during this visit with the patient were reviewed by me and considered in my medical decision making (see chart for details).

## 2019-07-18 ENCOUNTER — Ambulatory Visit (INDEPENDENT_AMBULATORY_CARE_PROVIDER_SITE_OTHER): Payer: Medicare Other | Admitting: Endocrinology

## 2019-07-18 ENCOUNTER — Other Ambulatory Visit: Payer: Self-pay

## 2019-07-18 ENCOUNTER — Encounter: Payer: Self-pay | Admitting: Endocrinology

## 2019-07-18 DIAGNOSIS — D352 Benign neoplasm of pituitary gland: Secondary | ICD-10-CM

## 2019-07-18 MED ORDER — DEXAMETHASONE 1 MG PO TABS: 1.0000 mg | ORAL_TABLET | ORAL | 0 refills | Status: DC

## 2019-07-18 NOTE — Patient Instructions (Addendum)
You should do a "dexamethasone suppression test."  For this, you would take dexamethasone 1 mg at 10 pm (I have sent a prescription to your pharmacy), then come in for a "cortisol" blood test the next morning before 9 am.  You do not need to be fasting for this test.  We are checking the other pituitary hormones then, also.   Please see eye and neurosurgery specialists.  you will receive a phone call, about days and time for appointments.

## 2019-07-18 NOTE — Progress Notes (Signed)
Subjective:    Patient ID: Elaine Burke, female    DOB: 1950/05/20, 69 y.o.   MRN: [id number]  HPI Pt is ref by Dr Rexene Alberts, for pituitary adenoma.  This was first suggested on CT scan in 2018.  Main symptom is unsteady gait.   Past Medical History:  Diagnosis Date  . Arthritis   . Arthritis   . Diabetes mellitus without complication ([place])   . HLD (hyperlipidemia)   . Hypertension     Past Surgical History:  Procedure Laterality Date  . ABDOMINAL HYSTERECTOMY    . CESAREAN SECTION    . COLONOSCOPY      Social History   Socioeconomic History  . Marital status: Married    Spouse name: Not on file  . Number of children: Not on file  . Years of education: Not on file  . Highest education level: Not on file  Occupational History  . Not on file  Tobacco Use  . Smoking status: Never Smoker  . Smokeless tobacco: Never Used  Substance and Sexual Activity  . Alcohol use: No  . Drug use: No  . Sexual activity: Not on file  Other Topics Concern  . Not on file  Social History Narrative  . Not on file   Social Determinants of Health   Financial Resource Strain:   . Difficulty of Paying Living Expenses:   Food Insecurity:   . Worried About Charity fundraiser in the Last Year:   . Arboriculturist in the Last Year:   Transportation Needs:   . Film/video editor (Medical):   Marland Kitchen Lack of Transportation (Non-Medical):   Physical Activity:   . Days of Exercise per Week:   . Minutes of Exercise per Session:   Stress:   . Feeling of Stress :   Social Connections:   . Frequency of Communication with Friends and Family:   . Frequency of Social Gatherings with Friends and Family:   . Attends Religious Services:   . Active Member of Clubs or Organizations:   . Attends Archivist Meetings:   Marland Kitchen Marital Status:   Intimate Partner Violence:   . Fear of Current or Ex-Partner:   . Emotionally Abused:   Marland Kitchen Physically Abused:   . Sexually Abused:      Current Outpatient Medications on File Prior to Visit  Medication Sig Dispense Refill  . amLODipine (NORVASC) 10 MG tablet Take 1 tablet by mouth daily.  5  . CALCIUM PO Take by mouth.    . loratadine (CLARITIN) 10 MG tablet Take 10 mg by mouth daily.    . potassium chloride (KLOR-CON) 10 MEQ tablet Take 10 mEq by mouth daily.    . rosuvastatin (CRESTOR) 5 MG tablet Take 5 mg by mouth daily.    . valsartan-hydrochlorothiazide (DIOVAN-HCT) 320-25 MG tablet Take 1 tablet by mouth daily.     No current facility-administered medications on file prior to visit.    Allergies  Allergen Reactions  . Shellfish Allergy Swelling  . Shellfish Allergy Swelling  . Lisinopril Other (See Comments)    MD said affects kidneys. No take   . Penicillins Hives, Itching and Swelling    Has patient had a PCN reaction causing immediate rash, facial/tongue/throat swelling, SOB or lightheadedness with hypotension: No Has patient had a PCN reaction causing severe rash involving mucus membranes or skin necrosis: No Has patient had a PCN reaction that required hospitalization: No Has patient had a PCN reaction  occurring within the last 10 years: No If all of the above answers are "NO", then may proceed with Cephalosporin use.     Family History  Problem Relation Age of Onset  . Breast cancer Paternal Aunt   . Other Neg Hx        pituitary abnormality    BP 124/68   Pulse (!) 103   Ht 5\' 3"  (1.6 m)   Wt 191 lb (86.6 kg)   SpO2 96%   BMI 33.83 kg/m    Review of Systems denies loss of smell, headache, visual loss, galactorrhea, weight change, and change in facial appearance.      Objective:   Physical Exam VS: see vs page GEN: no distress HEAD: head: no deformity eyes: no periorbital swelling, no proptosis external nose and ears are normal NECK: supple, thyroid is not enlarged CHEST WALL: no deformity LUNGS: clear to auscultation CV: reg rate and rhythm, no murmur.  MUSCULOSKELETAL:  muscle bulk and strength are grossly normal.  no obvious joint swelling.  gait is steady, with a walker EXTEMITIES: no deformity.  no edema PULSES: no carotid bruit NEURO:  cn 2-12 grossly intact.   readily moves all 4's.  sensation is intact to touch on all 4's SKIN:  Normal texture and temperature.  No rash or suspicious lesion is visible.   NODES:  None palpable at the neck PSYCH: alert, well-oriented.  Does not appear anxious nor depressed.   MRI: Diffusely enlarged and heterogeneously enhancing pituitary gland favored to reflect a 1.5 cm macroadenoma. Mild suprasellar extension without mass effect on the optic apparatus.   I have reviewed outside records, and summarized: Pt was noted to have abnormal MRI, and referred here.  She saw ortho, who did CT to eval gait diffficulty.       Assessment & Plan:  Pituitary adenoma, new to me.    Patient Instructions  You should do a "dexamethasone suppression test."  For this, you would take dexamethasone 1 mg at 10 pm (I have sent a prescription to your pharmacy), then come in for a "cortisol" blood test the next morning before 9 am.  You do not need to be fasting for this test.  We are checking the other pituitary hormones then, also.   Please see eye and neurosurgery specialists.  you will receive a phone call, about days and time for appointments.

## 2019-07-19 ENCOUNTER — Other Ambulatory Visit (INDEPENDENT_AMBULATORY_CARE_PROVIDER_SITE_OTHER): Payer: Medicare Other

## 2019-07-19 DIAGNOSIS — D352 Benign neoplasm of pituitary gland: Secondary | ICD-10-CM

## 2019-07-19 LAB — BASIC METABOLIC PANEL
BUN: 12 mg/dL (ref 6–23)
CO2: 29 mEq/L (ref 19–32)
Calcium: 10.9 mg/dL — ABNORMAL HIGH (ref 8.4–10.5)
Chloride: 99 mEq/L (ref 96–112)
Creatinine, Ser: 0.87 mg/dL (ref 0.40–1.20)
GFR: 78.44 mL/min (ref >60.00)
Glucose, Bld: 157 mg/dL — ABNORMAL HIGH (ref 70–99)
Potassium: 3.9 mEq/L (ref 3.5–5.1)
Sodium: 137 mEq/L (ref 135–145)

## 2019-07-19 LAB — LUTEINIZING HORMONE: LH: 24.48 m[IU]/mL

## 2019-07-19 LAB — CORTISOL: Cortisol, Plasma: 0.7 ug/dL

## 2019-07-19 LAB — T4, FREE: Free T4: 0.61 ng/dL (ref 0.60–1.60)

## 2019-07-19 LAB — FOLLICLE STIMULATING HORMONE: FSH: 48.2 m[IU]/mL

## 2019-07-21 LAB — ACTH: C206 ACTH: 6 pg/mL (ref 6–50)

## 2019-07-21 LAB — PROLACTIN: Prolactin: 25.1 ng/mL

## 2019-08-16 ENCOUNTER — Encounter: Payer: Self-pay | Admitting: Physical Therapy

## 2019-08-16 ENCOUNTER — Other Ambulatory Visit: Payer: Self-pay

## 2019-08-16 ENCOUNTER — Ambulatory Visit: Payer: Medicare Other | Attending: Family Medicine | Admitting: Physical Therapy

## 2019-08-16 DIAGNOSIS — M25561 Pain in right knee: Secondary | ICD-10-CM | POA: Insufficient documentation

## 2019-08-16 DIAGNOSIS — M6281 Muscle weakness (generalized): Secondary | ICD-10-CM | POA: Diagnosis present

## 2019-08-16 DIAGNOSIS — G8929 Other chronic pain: Secondary | ICD-10-CM | POA: Insufficient documentation

## 2019-08-16 DIAGNOSIS — R2689 Other abnormalities of gait and mobility: Secondary | ICD-10-CM | POA: Insufficient documentation

## 2019-08-16 DIAGNOSIS — R262 Difficulty in walking, not elsewhere classified: Secondary | ICD-10-CM | POA: Insufficient documentation

## 2019-08-16 DIAGNOSIS — M25562 Pain in left knee: Secondary | ICD-10-CM | POA: Insufficient documentation

## 2019-08-16 NOTE — Patient Instructions (Signed)
Access Code: [email or web address]  Seated Long Arc Quad (90-45 Degree Range) - 2 x daily - 7 x weekly - 3 sets - 10 reps  Seated Hip Abduction with Resistance - 2 x daily - 7 x weekly - 3 sets - 10 reps  Seated March - 2 x daily - 7 x weekly - 3 sets - 10 reps

## 2019-08-16 NOTE — Therapy (Signed)
[place], [place], [address] Phone: [phone]   Fax:  [phone]  Physical Therapy Evaluation  Patient Details  Name: Elaine Burke MRN: [id number] Date of Birth: Sep 04, 1950 Referring Provider (PT): Dr Hinda Lenis    Encounter Date: 08/16/2019    Past Medical History:  Diagnosis Date  . Arthritis   . Arthritis   . Diabetes mellitus without complication ([place])   . HLD (hyperlipidemia)   . Hypertension     Past Surgical History:  Procedure Laterality Date  . ABDOMINAL HYSTERECTOMY    . CESAREAN SECTION    . COLONOSCOPY      There were no vitals filed for this visit.   Subjective Assessment - 08/16/19 1153    Subjective  Patient reports in April of 2020 she began to feel likeher balance was off. At that times he was going to the gym and had been riding the bike. Due to the pandemic She was not able to go to the gy, Progressivley over time she has had increased difficulty with her balance walking. She does not gfeel like there is any positional component. In the morning she feels like she has increased difficulty getting out of bed. She is also having difficulty getting in and out of the chair.    Pertinent History  Bilateral knee pain,    Limitations  Standing;Walking    How long can you stand comfortably?  can not stand up very long    How long can you walk comfortably?  ocan not walk very far    Diagnostic tests  Brain MRI: enlargement of the pituitary gland without mass effect on the optic nerve    Patient Stated Goals  better balance, be able to walk further    Currently in Pain?  No/denies   Knees have not hurt since she had her cortisone shots        [facility] PT Assessment - 08/16/19 0001      Assessment   Medical Diagnosis  Decreased blance     Referring Provider (PT)  Dr Hinda Lenis     Onset Date/Surgical Date  --   April of 2020    Hand Dominance  Right    Next  MD Visit  6 months     Prior Therapy  Had therapy for her knees. her knees did well the last time       Precautions   Precautions  None      Restrictions   Weight Bearing Restrictions  No      Balance Screen   Has the patient fallen in the past 6 months  No    Has the patient had a decrease in activity level because of a fear of falling?   No    Is the patient reluctant to leave their home because of a fear of falling?   No      Home Environment   Additional Comments  2 story house. Master bedroooms downstairs. Has a few steps to get in       Prior Function   Level of Independence  Independent    Vocation  Retired    Leisure  going to the gym walking       Cognition   Overall Cognitive Status  Within Functional Limits for tasks assessed    Attention  Focused    Focused Attention  Appears intact    Memory  Appears intact    Awareness  Appears intact  Problem Solving  Appears intact      Observation/Other Assessments   Observations  all neurro tests ( coordination, peripheral vision, tracking, funger opposition) -       Sensation   Light Touch  Appears Intact    Additional Comments  denies parathesiads       Coordination   Gross Motor Movements are Fluid and Coordinated  Yes    Fine Motor Movements are Fluid and Coordinated  Yes      ROM / Strength   AROM / PROM / Strength  AROM;PROM;Strength      PROM   Overall PROM Comments  no pain with passive knee motion       Strength   Overall Strength Comments  bilateral knee 5/5     Strength Assessment Site  Hand;Knee;Hip    Right/Left hand  Right;Left    Right Hand Grip (lbs)  50    Left Hand Grip (lbs)  50    Right/Left Hip  Right;Left    Right Hip Flexion  4/5    Right Hip ABduction  4/5    Left Hip Flexion  4/5    Left Hip ABduction  4/5      Palpation   Palpation comment  no tenderness to palpation       Transfers   Comments  uses hands for sit to stand; uncontrolled decent stand to sit        Ambulation/Gait   Gait Comments  right knee hyper extension with gait; decreased bliateral hip flexion; decrreased hip rotation       Standardized Balance Assessment   Standardized Balance Assessment  Berg Balance Test      Berg Balance Test   Sit to Stand  Able to stand  independently using hands    Standing Unsupported  Able to stand safely 2 minutes    Sitting with Back Unsupported but Feet Supported on Floor or Stool  Able to sit safely and securely 2 minutes    Stand to Sit  Sits independently, has uncontrolled descent    Transfers  Able to transfer with verbal cueing and /or supervision    Standing Unsupported with Eyes Closed  Able to stand 10 seconds with supervision    Standing Unsupported with Feet Together  Able to place feet together independently but unable to hold for 30 seconds    From Standing, Reach Forward with Outstretched Arm  Can reach forward >5 cm safely (2")    From Standing Position, Pick up Object from Floor  Able to pick up shoe, needs supervision    From Standing Position, Turn to Look Behind Over each Shoulder  Looks behind one side only/other side shows less weight shift    Turn 360 Degrees  Able to turn 360 degrees safely but slowly    Standing Unsupported, Alternately Place Feet on Step/Stool  Needs assistance to keep from falling or unable to try    Standing Unsupported, One Foot in Front  Needs help to step but can hold 15 seconds    Standing on One Leg  Unable to try or needs assist to prevent fall    Total Score  30      High Level Balance   High Level Balance Comments  unable to perfrom tandem stance                  Objective measurements completed on examination: See above findings.      [facility] Adult PT Treatment/Exercise - 08/16/19  0001      Knee/Hip Exercises: Seated   Other Seated Knee/Hip Exercises  laq 90-45x10 each leg; seated march x10 each leg; bilateral abduction 2x10 red                PT Short Term Goals -  08/16/19 1549      PT SHORT TERM GOAL #1   Title  Patient will increase gros bilateral LE strength to 4+/5    Time  3    Period  Weeks    Status  New    Target Date  09/06/19      PT SHORT TERM GOAL #2   Title  Patient wil transfer sit to stand without the use of her hands    Time  3    Period  Weeks    Status  New    Target Date  09/06/19      PT SHORT TERM GOAL #3   Title  Patient will ambualte 200' with LRAD without right knee hyper extension    Time  3    Period  Weeks    Status  New    Target Date  09/06/19        PT Long Term Goals - 08/16/19 1551      PT LONG TERM GOAL #1   Title  Patient will go up/down 8 steps without pain     Time  6    Period  Weeks    Status  New    Target Date  09/27/19      PT LONG TERM GOAL #2   Title  Patient will ambalte 2000' with LRAD without antalgic gait or decreased balance    Time  6    Period  Weeks    Status  New    Target Date  09/27/19      PT LONG TERM GOAL #3   Title  Patient will increase BERG score to 45 to decrease falls    Time  6    Period  Weeks    Status  New    Target Date  09/27/19             Plan - 08/16/19 1539    Clinical Impression Statement  Patient is a 69 year old female who presents with decreased balance and mobility. her right knee hyperextends with ambualtion without herwalker. She has decreased bilateral hip flexion. She scored a 30 on the BERG balance scale putting her at a high fall risk. She had an incidious onset of symtoms. She would benefit from skilled therapy to improve gait stability, and over all mobility.    Personal Factors and Comorbidities  Comorbidity 1;Comorbidity 2    Comorbidities  bilateral knee OA, DMII    Examination-Activity Limitations  Sit;Transfers;Squat;Lift;Locomotion Level;Carry;Stand    Examination-Participation Restrictions  Shop;Laundry;Cleaning;Driving    Stability/Clinical Decision Making  Evolving/Moderate complexity    Clinical Decision Making   Moderate    Rehab Potential  Good    PT Frequency  2x / week    PT Duration  6 weeks    PT Treatment/Interventions  ADLs/Self Care Home Management;Cryotherapy;Electrical Stimulation;Moist Heat;Functional mobility training;Therapeutic activities;Therapeutic exercise;Patient/family education;Balance training;Neuromuscular re-education;Manual techniques;Passive range of motion;Taping    PT Next Visit Plan  review HEP; consider supine core strengthening and ther-ex with a progression to standing; startstanding balance; progress from floor -> air-ex as tolerated.    PT Home Exercise Plan  Access Code: [email or web address].Seated Long Arc Quad (90-45 Degree Range) - 2  x daily - 7 x weekly - 3 sets - 10 reps.Seated Hip Abduction with Resistance - 2 x daily - 7 x weekly - 3 sets - 10 reps.Seated March - 2 x daily - 7 x weekly - 3 sets - 10 reps    Consulted and Agree with Plan of Care  Patient       Patient will benefit from skilled therapeutic intervention in order to improve the following deficits and impairments:  Abnormal gait, Pain, Decreased activity tolerance, Decreased safety awareness, Decreased range of motion, Increased muscle spasms, Difficulty walking, Decreased endurance, Impaired perceived functional ability, Increased fascial restricitons, Decreased strength  Visit Diagnosis: Other abnormalities of gait and mobility  Difficulty in walking, not elsewhere classified  Muscle weakness (generalized)     Problem List Patient Active Problem List   Diagnosis Date Noted  . Hypercalcemia 07/20/2019  . Pituitary macroadenoma ([place]) 07/18/2019    Carney Living PT DPT  08/16/2019, 3:56 PM  [facility] [address] [place], [place], [address] Phone: [phone]   Fax:  [phone]  Name: Elaine Burke MRN: [id number] Date of Birth: [birth year]

## 2019-08-22 ENCOUNTER — Ambulatory Visit: Payer: Medicare Other | Admitting: Physical Therapy

## 2019-08-24 ENCOUNTER — Encounter: Payer: Self-pay | Admitting: Physical Therapy

## 2019-08-24 ENCOUNTER — Other Ambulatory Visit: Payer: Self-pay

## 2019-08-24 ENCOUNTER — Ambulatory Visit: Payer: Medicare Other | Admitting: Physical Therapy

## 2019-08-24 DIAGNOSIS — G8929 Other chronic pain: Secondary | ICD-10-CM

## 2019-08-24 DIAGNOSIS — R2689 Other abnormalities of gait and mobility: Secondary | ICD-10-CM

## 2019-08-24 DIAGNOSIS — M6281 Muscle weakness (generalized): Secondary | ICD-10-CM

## 2019-08-24 DIAGNOSIS — R262 Difficulty in walking, not elsewhere classified: Secondary | ICD-10-CM

## 2019-08-24 DIAGNOSIS — M25562 Pain in left knee: Secondary | ICD-10-CM

## 2019-08-25 ENCOUNTER — Encounter: Payer: Self-pay | Admitting: Physical Therapy

## 2019-08-25 NOTE — Therapy (Signed)
[place], [place], [address] Phone: [phone]   Fax:  [phone]  Physical Therapy Treatment  Patient Details  Name: Elaine Burke MRN: [id number] Date of Birth: 1950-09-12 Referring Provider (PT): Dr Hinda Lenis    Encounter Date: 08/24/2019   PT End of Session - 08/24/19 1533    Visit Number 2    Number of Visits 16    Date for PT Re-Evaluation 10/11/19    Authorization Type [employer] Medicare    PT Start Time 1500    PT Stop Time 1542    PT Time Calculation (min) 42 min    Activity Tolerance Patient tolerated treatment well    Behavior During Therapy [facility] for tasks assessed/performed           Past Medical History:  Diagnosis Date  . Arthritis   . Arthritis   . Diabetes mellitus without complication ([place])   . HLD (hyperlipidemia)   . Hypertension     Past Surgical History:  Procedure Laterality Date  . ABDOMINAL HYSTERECTOMY    . CESAREAN SECTION    . COLONOSCOPY      There were no vitals filed for this visit.   Subjective Assessment - 08/24/19 1505    Subjective Patient has no complaints today. She has not had any falls. She has been working on her exercises. She has had no pain.    Pertinent History Bilateral knee pain,    Limitations Standing;Walking    How long can you stand comfortably? can not stand up very long    How long can you walk comfortably? ocan not walk very far    Diagnostic tests Brain MRI: enlargement of the pituitary gland without mass effect on the optic nerve    Patient Stated Goals better balance, be able to walk further    Currently in Pain? No/denies                             [facility] Adult PT Treatment/Exercise - 08/25/19 0001      High Level Balance   High Level Balance Comments tandem stance eyes open and eyes closed 2x30sec; narrow base eyes open and eyes closed 2x30 sec hold  hell rocking with balance catch 2x10; standing  march without UE support 2x10       Knee/Hip Exercises: Seated   Other Seated Knee/Hip Exercises x15 2lb     Hamstring Limitations hamstring curl x15 red       Knee/Hip Exercises: Supine   Short Arc Quad Sets Limitations 2x10 2lb     Other Supine Knee/Hip Exercises supine march 2x10     Other Supine Knee/Hip Exercises supine hipo abduction red 2x10                  PT Education - 08/24/19 1532    Education Details reviewed purpose of balance exercises and progression    Person(s) Educated Patient    Methods Explanation;Tactile cues;Demonstration    Comprehension Verbalized understanding;Returned demonstration;Verbal cues required;Tactile cues required            PT Short Term Goals - 08/16/19 1549      PT SHORT TERM GOAL #1   Title Patient will increase gros bilateral LE strength to 4+/5    Time 3    Period Weeks    Status New    Target Date 09/06/19      PT SHORT TERM GOAL #  2   Title Patient wil transfer sit to stand without the use of her hands    Time 3    Period Weeks    Status New    Target Date 09/06/19      PT SHORT TERM GOAL #3   Title Patient will ambualte 200' with LRAD without right knee hyper extension    Time 3    Period Weeks    Status New    Target Date 09/06/19             PT Long Term Goals - 08/16/19 1551      PT LONG TERM GOAL #1   Title Patient will go up/down 8 steps without pain     Time 6    Period Weeks    Status New    Target Date 09/27/19      PT LONG TERM GOAL #2   Title Patient will ambalte 2000' with LRAD without antalgic gait or decreased balance    Time 6    Period Weeks    Status New    Target Date 09/27/19      PT LONG TERM GOAL #3   Title Patient will increase BERG score to 45 to decrease falls    Time 6    Period Weeks    Status New    Target Date 09/27/19                 Plan - 08/24/19 1709    Clinical Impression Statement Patient tolerated treatment well. She reported a challenge with LE  exercises but no signoificant faituge. She did require min to mofd a with balance exercises. She was given tandem stance and narrow base for home. She was advised to do at home but keep her eyes open. She required significantly more assit with her eyes closed. We will work on that in clinic.    Personal Factors and Comorbidities Comorbidity 1;Comorbidity 2    Comorbidities bilateral knee OA, DMII    Examination-Activity Limitations Sit;Transfers;Squat;Lift;Locomotion Level;Carry;Stand    Stability/Clinical Decision Making Evolving/Moderate complexity    Clinical Decision Making Moderate    Rehab Potential Good    PT Frequency 2x / week    PT Duration 6 weeks    PT Treatment/Interventions ADLs/Self Care Home Management;Cryotherapy;Electrical Stimulation;Moist Heat;Functional mobility training;Therapeutic activities;Therapeutic exercise;Patient/family education;Balance training;Neuromuscular re-education;Manual techniques;Passive range of motion;Taping    PT Next Visit Plan review HEP; consider supine core strengthening and ther-ex with a progression to standing; startstanding balance; progress from floor -> air-ex as tolerated.    PT Home Exercise Plan Access Code: [email or web address].Seated Long Arc Quad (90-45 Degree Range) - 2 x daily - 7 x weekly - 3 sets - 10 reps.Seated Hip Abduction with Resistance - 2 x daily - 7 x weekly - 3 sets - 10 reps.Seated March - 2 x daily - 7 x weekly - 3 sets - 10 reps    Consulted and Agree with Plan of Care Patient           Patient will benefit from skilled therapeutic intervention in order to improve the following deficits and impairments:  Abnormal gait, Pain, Decreased activity tolerance, Decreased safety awareness, Decreased range of motion, Increased muscle spasms, Difficulty walking, Decreased endurance, Impaired perceived functional ability, Increased fascial restricitons, Decreased strength  Visit Diagnosis: Other abnormalities of gait and  mobility  Difficulty in walking, not elsewhere classified  Muscle weakness (generalized)  Chronic pain of right knee  Chronic pain of left knee  Problem List Patient Active Problem List   Diagnosis Date Noted  . Hypercalcemia 07/20/2019  . Pituitary macroadenoma ([place]) 07/18/2019    Carney Living PT DPT  08/25/2019, 8:00 AM  [facility] [address] [place], [place], [address] Phone: [phone]   Fax:  [phone]  Name: Muriah Harsha MRN: [id number] Date of Birth: 11/18/1950

## 2019-09-04 ENCOUNTER — Encounter: Payer: Self-pay | Admitting: Physical Therapy

## 2019-09-04 ENCOUNTER — Other Ambulatory Visit: Payer: Self-pay

## 2019-09-04 ENCOUNTER — Ambulatory Visit: Payer: Medicare Other | Admitting: Physical Therapy

## 2019-09-04 DIAGNOSIS — R2689 Other abnormalities of gait and mobility: Secondary | ICD-10-CM

## 2019-09-04 DIAGNOSIS — M6281 Muscle weakness (generalized): Secondary | ICD-10-CM

## 2019-09-04 DIAGNOSIS — M25562 Pain in left knee: Secondary | ICD-10-CM

## 2019-09-04 DIAGNOSIS — M25561 Pain in right knee: Secondary | ICD-10-CM

## 2019-09-04 DIAGNOSIS — R262 Difficulty in walking, not elsewhere classified: Secondary | ICD-10-CM

## 2019-09-04 DIAGNOSIS — G8929 Other chronic pain: Secondary | ICD-10-CM

## 2019-09-05 ENCOUNTER — Encounter: Payer: Self-pay | Admitting: Physical Therapy

## 2019-09-05 NOTE — Therapy (Addendum)
[place], [place], [address] Phone: [phone]   Fax:  [phone]  Physical Therapy Treatment  Patient Details  Name: Elaine Burke MRN: [id number] Date of Birth: Jun 25, 1950 Referring Provider (PT): Dr Hinda Lenis    Encounter Date: 09/04/2019   PT End of Session - 09/04/19 1636    Visit Number 3    Number of Visits 16    Date for PT Re-Evaluation 10/11/19    PT Start Time 1633    PT Stop Time 1715    PT Time Calculation (min) 42 min    Equipment Utilized During Treatment Gait belt    Activity Tolerance Patient tolerated treatment well    Behavior During Therapy [facility] for tasks assessed/performed           Past Medical History:  Diagnosis Date  . Arthritis   . Arthritis   . Diabetes mellitus without complication ([place])   . HLD (hyperlipidemia)   . Hypertension     Past Surgical History:  Procedure Laterality Date  . ABDOMINAL HYSTERECTOMY    . CESAREAN SECTION    . COLONOSCOPY      There were no vitals filed for this visit.   Subjective Assessment - 09/04/19 1635    Subjective Patient has not had any falls and has felt good after last visit. She did not feel sore after last visit. She has no pain today.    Pertinent History Bilateral knee pain,    Currently in Pain? No/denies                             [facility] Adult PT Treatment/Exercise - 09/05/19 0001      Neuro Re-ed    Neuro Re-ed Details  narrow BOS eyes open 2x30 sec, eyes closed 2x15 sec; semi-tandem stance eyes open 2x30 sec, eyes closed 2x10 sec; counterclockwise 4 square stepping x 5; forward/backward stepping into squares x 5  Toe tapping on a 4 inch step      Knee/Hip Exercises: Aerobic   Nustep L3 x 5 min      Knee/Hip Exercises: Seated   Long Arc Quad 2 sets;15 reps;Both   2 lb ankle weights   Hamstring Limitations hamstring curl x15 red       Knee/Hip Exercises: Supine   Bridges 3  sets;10 reps    Other Supine Knee/Hip Exercises supine march 20 reps     Other Supine Knee/Hip Exercises supine hip abduction red 2x10                     PT Short Term Goals - 08/16/19 1549      PT SHORT TERM GOAL #1   Title Patient will increase gros bilateral LE strength to 4+/5    Time 3    Period Weeks    Status New    Target Date 09/06/19      PT SHORT TERM GOAL #2   Title Patient wil transfer sit to stand without the use of her hands    Time 3    Period Weeks    Status New    Target Date 09/06/19      PT SHORT TERM GOAL #3   Title Patient will ambualte 200' with LRAD without right knee hyper extension    Time 3    Period Weeks    Status New    Target Date 09/06/19  PT Long Term Goals - 08/16/19 1551      PT LONG TERM GOAL #1   Title Patient will go up/down 8 steps without pain     Time 6    Period Weeks    Status New    Target Date 09/27/19      PT LONG TERM GOAL #2   Title Patient will ambalte 2000' with LRAD without antalgic gait or decreased balance    Time 6    Period Weeks    Status New    Target Date 09/27/19      PT LONG TERM GOAL #3   Title Patient will increase BERG score to 45 to decrease falls    Time 6    Period Weeks    Status New    Target Date 09/27/19                 Plan - 09/05/19 0944    Clinical Impression Statement Therapy continued working on quad and hip stregnthening. Worked on home balance exercises and progressed them in the session today. Therapy worked on LE coordinated with 4 square stepping cueing to get into a rhythm. Patient exhibited improved balance on narrow BOS stance with eyes open and closed and on semi-tandem stance. Patient reports she has been practicing these balance exercises at home. Patient was instructed to continue these exercises at home with eyes open.    Personal Factors and Comorbidities Comorbidity 1;Comorbidity 2    Examination-Activity Limitations  Sit;Transfers;Squat;Lift;Locomotion Level;Carry;Stand    Stability/Clinical Decision Making Evolving/Moderate complexity    Clinical Decision Making Moderate    Rehab Potential Good    PT Frequency 2x / week    PT Duration 6 weeks    PT Treatment/Interventions ADLs/Self Care Home Management;Cryotherapy;Electrical Stimulation;Moist Heat;Functional mobility training;Therapeutic activities;Therapeutic exercise;Patient/family education;Balance training;Neuromuscular re-education;Manual techniques;Passive range of motion;Taping    PT Next Visit Plan review HEP; consider supine core strengthening and ther-ex with a progression to standing; startstanding balance; progress from floor -> air-ex as tolerated.    PT Home Exercise Plan Access Code: [email or web address].Seated Long Arc Quad (90-45 Degree Range) - 2 x daily - 7 x weekly - 3 sets - 10 reps.Seated Hip Abduction with Resistance - 2 x daily - 7 x weekly - 3 sets - 10 reps.Seated March - 2 x daily - 7 x weekly - 3 sets - 10 reps    Consulted and Agree with Plan of Care Patient           Patient will benefit from skilled therapeutic intervention in order to improve the following deficits and impairments:  Abnormal gait, Pain, Decreased activity tolerance, Decreased safety awareness, Decreased range of motion, Increased muscle spasms, Difficulty walking, Decreased endurance, Impaired perceived functional ability, Increased fascial restricitons, Decreased strength  Visit Diagnosis: Other abnormalities of gait and mobility  Difficulty in walking, not elsewhere classified  Muscle weakness (generalized)  Chronic pain of right knee  Chronic pain of left knee     Problem List Patient Active Problem List   Diagnosis Date Noted  . Hypercalcemia 07/20/2019  . Pituitary macroadenoma ([place]) 07/18/2019    Raad Clayson SPT 09/05/2019, 9:53 AM   Carolyne Littles PT DPT  09/05/2019  During this treatment session, the therapist was present,  participating in and directing the treatment.   [place], [place], [address] Phone: [phone]   Fax:  [phone]  Name: Elaine Burke MRN: [id number] Date of Birth: Aug 16, 1950

## 2019-09-06 ENCOUNTER — Ambulatory Visit: Payer: Medicare Other | Admitting: Physical Therapy

## 2019-09-06 ENCOUNTER — Encounter: Payer: Self-pay | Admitting: Physical Therapy

## 2019-09-06 ENCOUNTER — Other Ambulatory Visit: Payer: Self-pay

## 2019-09-06 DIAGNOSIS — R262 Difficulty in walking, not elsewhere classified: Secondary | ICD-10-CM

## 2019-09-06 DIAGNOSIS — R2689 Other abnormalities of gait and mobility: Secondary | ICD-10-CM | POA: Diagnosis not present

## 2019-09-06 DIAGNOSIS — G8929 Other chronic pain: Secondary | ICD-10-CM

## 2019-09-06 DIAGNOSIS — M6281 Muscle weakness (generalized): Secondary | ICD-10-CM

## 2019-09-06 NOTE — Therapy (Signed)
[place], [place], [address] Phone: [phone]   Fax:  [phone]  Physical Therapy Treatment  Patient Details  Name: Elaine Burke MRN: [id number] Date of Birth: 18-Dec-1950 Referring Provider (PT): Dr Hinda Lenis    Encounter Date: 09/06/2019   PT End of Session - 09/06/19 1503    Visit Number 4    Number of Visits 16    Date for PT Re-Evaluation 10/11/19    PT Start Time 1502    PT Stop Time 1542    PT Time Calculation (min) 40 min    Equipment Utilized During Treatment Gait belt    Activity Tolerance Patient tolerated treatment well    Behavior During Therapy [facility] for tasks assessed/performed           Past Medical History:  Diagnosis Date  . Arthritis   . Arthritis   . Diabetes mellitus without complication ([place])   . HLD (hyperlipidemia)   . Hypertension     Past Surgical History:  Procedure Laterality Date  . ABDOMINAL HYSTERECTOMY    . CESAREAN SECTION    . COLONOSCOPY      There were no vitals filed for this visit.   Subjective Assessment - 09/06/19 1503    Subjective Patient states she is feeling pretty good today. No soreness after last session and no falls. She has no pain today.    Currently in Pain? No/denies                             [facility] Adult PT Treatment/Exercise - 09/06/19 0001      Neuro Re-ed    Neuro Re-ed Details  narrow BOS eyes open 2x30 sec, eyes closed 2x15 sec; semi-tandem stance eyes open 2x30 sec, eyes closed 2x10 sec; counterclockwise and clockwise 4-square stepping x 5 each way; forward/backward stepping into squares x 5 each way; lateral stepping over hurdle x10 both ways; forward/backward stepping over hurdle x10 both ways      Knee/Hip Exercises: Aerobic   Nustep L5 x 5 min      Knee/Hip Exercises: Seated   Long Arc Quad 2 sets;15 reps;Both    Hamstring Limitations hamstring curl x15 green      Knee/Hip  Exercises: Supine   Bridges 2 sets;15 reps    Straight Leg Raises 2 sets;15 reps    Other Supine Knee/Hip Exercises supine march 20 reps    2 lbs   Other Supine Knee/Hip Exercises supine hip abduction green 2x10                  PT Education - 09/06/19 1650    Education Details reviewed HEP and balance exercises    Person(s) Educated Patient    Methods Explanation;Demonstration;Tactile cues;Verbal cues    Comprehension Verbal cues required;Returned demonstration;Verbalized understanding;Tactile cues required            PT Short Term Goals - 08/16/19 1549      PT SHORT TERM GOAL #1   Title Patient will increase gros bilateral LE strength to 4+/5    Time 3    Period Weeks    Status New    Target Date 09/06/19      PT SHORT TERM GOAL #2   Title Patient wil transfer sit to stand without the use of her hands    Time 3    Period Weeks    Status New  Target Date 09/06/19      PT SHORT TERM GOAL #3   Title Patient will ambualte 200' with LRAD without right knee hyper extension    Time 3    Period Weeks    Status New    Target Date 09/06/19             PT Long Term Goals - 08/16/19 1551      PT LONG TERM GOAL #1   Title Patient will go up/down 8 steps without pain     Time 6    Period Weeks    Status New    Target Date 09/27/19      PT LONG TERM GOAL #2   Title Patient will ambalte 2000' with LRAD without antalgic gait or decreased balance    Time 6    Period Weeks    Status New    Target Date 09/27/19      PT LONG TERM GOAL #3   Title Patient will increase BERG score to 45 to decrease falls    Time 6    Period Weeks    Status New    Target Date 09/27/19                 Plan - 09/06/19 1652    Clinical Impression Statement Continued to work on LE strengthening and explained the benefit of strengthening to help improve balance and gait. Therapy continued to work on patient's balance standing with a narrow BOS and semi-tandem BOS with eyes  open and closed. Patient seemed to have improved slightly with the balance exercises. Therapy added stepping over hurdles laterally and forward and backward. Patient did well with the additional exercise.    Stability/Clinical Decision Making Evolving/Moderate complexity    Clinical Decision Making Moderate    Rehab Potential Good    PT Treatment/Interventions ADLs/Self Care Home Management;Cryotherapy;Electrical Stimulation;Moist Heat;Functional mobility training;Therapeutic activities;Therapeutic exercise;Patient/family education;Balance training;Neuromuscular re-education;Manual techniques;Passive range of motion;Taping    PT Next Visit Plan review HEP; consider supine core strengthening and ther-ex with a progression to standing; startstanding balance; progress from floor -> air-ex as tolerated.    PT Home Exercise Plan Access Code: [email or web address] Long Arc Quad (90-45 Degree Range) - 2 x daily - 7 x weekly - 3 sets - 10 reps.Seated Hip Abduction with Resistance - 2 x daily - 7 x weekly - 3 sets - 10 reps.Seated March - 2 x daily - 7 x weekly - 3 sets - 10 reps    Consulted and Agree with Plan of Care Patient           Patient will benefit from skilled therapeutic intervention in order to improve the following deficits and impairments:  Abnormal gait, Pain, Decreased activity tolerance, Decreased safety awareness, Decreased range of motion, Increased muscle spasms, Difficulty walking, Decreased endurance, Impaired perceived functional ability, Increased fascial restricitons, Decreased strength  Visit Diagnosis: Other abnormalities of gait and mobility  Difficulty in walking, not elsewhere classified  Muscle weakness (generalized)  Chronic pain of right knee  Chronic pain of left knee     Problem List Patient Active Problem List   Diagnosis Date Noted  . Hypercalcemia 07/20/2019  . Pituitary macroadenoma ([place]) 07/18/2019    Minna Merritts SPT 09/06/2019, 5:18 PM  [facility] [address] [place], [place], [address] Phone: [phone]   Fax:  [phone]  Name: Elaine Burke MRN: [id number] Date of Birth: November 24, 1950

## 2019-09-07 ENCOUNTER — Encounter: Payer: Self-pay | Admitting: Physical Therapy

## 2019-09-07 NOTE — Therapy (Signed)
[place], [place], [address] Phone: [phone]   Fax:  [phone]  Physical Therapy Treatment  Patient Details  Name: Elaine Burke MRN: [id number] Date of Birth: 1950-12-14 Referring Provider (PT): Dr Hinda Lenis    Encounter Date: 09/06/2019   PT End of Session - 09/06/19 1503    Visit Number 4    Number of Visits 16    Date for PT Re-Evaluation 10/11/19    PT Start Time 1502    PT Stop Time 1542    PT Time Calculation (min) 40 min    Equipment Utilized During Treatment Gait belt    Activity Tolerance Patient tolerated treatment well    Behavior During Therapy [facility] for tasks assessed/performed           Past Medical History:  Diagnosis Date  . Arthritis   . Arthritis   . Diabetes mellitus without complication ([place])   . HLD (hyperlipidemia)   . Hypertension     Past Surgical History:  Procedure Laterality Date  . ABDOMINAL HYSTERECTOMY    . CESAREAN SECTION    . COLONOSCOPY      There were no vitals filed for this visit.   Subjective Assessment - 09/06/19 1503    Subjective Patient states she is feeling pretty good today. No soreness after last session and no falls. She has no pain today.    Currently in Pain? No/denies                                     PT Education - 09/06/19 1650    Education Details reviewed HEP and balance exercises    Person(s) Educated Patient    Methods Explanation;Demonstration;Tactile cues;Verbal cues    Comprehension Verbal cues required;Returned demonstration;Verbalized understanding;Tactile cues required            PT Short Term Goals - 08/16/19 1549      PT SHORT TERM GOAL #1   Title Patient will increase gros bilateral LE strength to 4+/5    Time 3    Period Weeks    Status New    Target Date 09/06/19      PT SHORT TERM GOAL #2   Title Patient wil transfer sit to stand without the use of her  hands    Time 3    Period Weeks    Status New    Target Date 09/06/19      PT SHORT TERM GOAL #3   Title Patient will ambualte 200' with LRAD without right knee hyper extension    Time 3    Period Weeks    Status New    Target Date 09/06/19             PT Long Term Goals - 08/16/19 1551      PT LONG TERM GOAL #1   Title Patient will go up/down 8 steps without pain     Time 6    Period Weeks    Status New    Target Date 09/27/19      PT LONG TERM GOAL #2   Title Patient will ambalte 2000' with LRAD without antalgic gait or decreased balance    Time 6    Period Weeks    Status New    Target Date 09/27/19      PT LONG TERM GOAL #3   Title Patient will  increase BERG score to 45 to decrease falls    Time 6    Period Weeks    Status New    Target Date 09/27/19                 Plan - 09/06/19 1652    Clinical Impression Statement Continued to work on LE strengthening and explained the benefit of strengthening to help improve balance and gait. Therapy continued to work on patient's balance standing with a narrow BOS and semi-tandem BOS with eyes open and closed. Patient seemed to have improved slightly with the balance exercises. Therapy added stepping over hurdles laterally and forward and backward. Patient did well with the additional exercise.    Stability/Clinical Decision Making Evolving/Moderate complexity    Clinical Decision Making Moderate    Rehab Potential Good    PT Treatment/Interventions ADLs/Self Care Home Management;Cryotherapy;Electrical Stimulation;Moist Heat;Functional mobility training;Therapeutic activities;Therapeutic exercise;Patient/family education;Balance training;Neuromuscular re-education;Manual techniques;Passive range of motion;Taping    PT Next Visit Plan review HEP; consider supine core strengthening and ther-ex with a progression to standing; startstanding balance; progress from floor -> air-ex as tolerated.    PT Home Exercise Plan  Access Code: [email or web address].Seated Long Arc Quad (90-45 Degree Range) - 2 x daily - 7 x weekly - 3 sets - 10 reps.Seated Hip Abduction with Resistance - 2 x daily - 7 x weekly - 3 sets - 10 reps.Seated March - 2 x daily - 7 x weekly - 3 sets - 10 reps    Consulted and Agree with Plan of Care Patient           Patient will benefit from skilled therapeutic intervention in order to improve the following deficits and impairments:  Abnormal gait, Pain, Decreased activity tolerance, Decreased safety awareness, Decreased range of motion, Increased muscle spasms, Difficulty walking, Decreased endurance, Impaired perceived functional ability, Increased fascial restricitons, Decreased strength  Visit Diagnosis: Other abnormalities of gait and mobility  Difficulty in walking, not elsewhere classified  Muscle weakness (generalized)  Chronic pain of right knee  Chronic pain of left knee     Problem List Patient Active Problem List   Diagnosis Date Noted  . Hypercalcemia 07/20/2019  . Pituitary macroadenoma ([place]) 07/18/2019    Carney Living PT DPT  09/07/2019, 9:11 AM  [facility] [address] [place], [place], [address] Phone: [phone]   Fax:  [phone]  Name: Elaine Burke MRN: [id number] Date of Birth: October 22, 1950

## 2019-09-12 ENCOUNTER — Encounter: Payer: Self-pay | Admitting: Physical Therapy

## 2019-09-12 ENCOUNTER — Ambulatory Visit: Payer: Medicare Other | Attending: Family Medicine | Admitting: Physical Therapy

## 2019-09-12 ENCOUNTER — Other Ambulatory Visit: Payer: Self-pay

## 2019-09-12 DIAGNOSIS — G8929 Other chronic pain: Secondary | ICD-10-CM

## 2019-09-12 DIAGNOSIS — M25562 Pain in left knee: Secondary | ICD-10-CM | POA: Insufficient documentation

## 2019-09-12 DIAGNOSIS — M25561 Pain in right knee: Secondary | ICD-10-CM | POA: Insufficient documentation

## 2019-09-12 DIAGNOSIS — M6281 Muscle weakness (generalized): Secondary | ICD-10-CM | POA: Diagnosis present

## 2019-09-12 DIAGNOSIS — R262 Difficulty in walking, not elsewhere classified: Secondary | ICD-10-CM | POA: Insufficient documentation

## 2019-09-12 DIAGNOSIS — R2689 Other abnormalities of gait and mobility: Secondary | ICD-10-CM

## 2019-09-12 NOTE — Therapy (Signed)
[place], [place], [address] Phone: [phone]   Fax:  [phone]  Physical Therapy Treatment  Patient Details  Name: Elaine Burke MRN: [id number] Date of Birth: [birth year] Referring Provider (PT): Dr Hinda Lenis    Encounter Date: 09/12/2019   PT End of Session - 09/12/19 1024    Visit Number 5    Number of Visits 16    Date for PT Re-Evaluation 10/11/19    PT Start Time 1018    PT Stop Time 1056    PT Time Calculation (min) 38 min    Equipment Utilized During Treatment Gait belt    Activity Tolerance Patient tolerated treatment well    Behavior During Therapy [facility] for tasks assessed/performed           Past Medical History:  Diagnosis Date  . Arthritis   . Arthritis   . Diabetes mellitus without complication ([place])   . HLD (hyperlipidemia)   . Hypertension     Past Surgical History:  Procedure Laterality Date  . ABDOMINAL HYSTERECTOMY    . CESAREAN SECTION    . COLONOSCOPY      There were no vitals filed for this visit.   Subjective Assessment - 09/12/19 1023    Subjective Patient states she has some pain in her L knee today. She was up doing a lot around the house over the weekend. Patient had no falls over the weekend.    Pertinent History Bilateral knee pain,    Limitations Standing;Walking    How long can you stand comfortably? can not stand up very long    How long can you walk comfortably? ocan not walk very far    Diagnostic tests Brain MRI: enlargement of the pituitary gland without mass effect on the optic nerve    Patient Stated Goals better balance, be able to walk further    Currently in Pain? Yes    Pain Score 2     Pain Location Knee    Pain Orientation Left    Pain Descriptors / Indicators Aching    Pain Type Chronic pain    Pain Onset More than a month ago    Pain Frequency Constant    Aggravating Factors  standing and walking    Pain Relieving  Factors rest                             [facility] Adult PT Treatment/Exercise - 09/12/19 0001      Ambulation/Gait   Gait Comments walking with cane 20 feet. Min guard and cuing for technique. Patient caught her canr on the floot at one point.       Neuro Re-ed    Neuro Re-ed Details  narrow BOS eyes open 2x30 sec, eyes closed 4x15 sec      Knee/Hip Exercises: Aerobic   Nustep L x 5 min      Knee/Hip Exercises: Seated   Long Arc Quad 2 sets;15 reps;Both   1 lb   Hamstring Limitations hamstring curl x15 green      Knee/Hip Exercises: Supine   Bridges 2 sets;10 reps    Straight Leg Raises 2 sets;15 reps    Other Supine Knee/Hip Exercises supine march 20 reps     Other Supine Knee/Hip Exercises supine hip abduction green 2x10      Manual Therapy   Manual therapy comments assessed patella movement  PT Education - 09/12/19 1213    Education Details HEP and symptom mangement    Person(s) Educated Patient    Methods Explanation;Demonstration;Tactile cues;Verbal cues    Comprehension Verbalized understanding;Returned demonstration;Verbal cues required;Tactile cues required            PT Short Term Goals - 09/12/19 1214      PT SHORT TERM GOAL #1   Title Patient will increase gros bilateral LE strength to 4+/5    Baseline 5/5 gross     Time 3    Period Weeks    Status On-going    Target Date 09/06/19      PT SHORT TERM GOAL #2   Title Patient wil transfer sit to stand without the use of her hands    Baseline independnet with intial HEP     Time 3    Period Weeks    Status On-going    Target Date 09/06/19      PT SHORT TERM GOAL #3   Title Patient will ambualte 200' with LRAD without right knee hyper extension    Baseline worked on gait with cane    Time 3    Period Weeks    Status On-going    Target Date 09/06/19             PT Long Term Goals - 08/16/19 1551      PT LONG TERM GOAL #1   Title Patient will go  up/down 8 steps without pain     Time 6    Period Weeks    Status New    Target Date 09/27/19      PT LONG TERM GOAL #2   Title Patient will ambalte 2000' with LRAD without antalgic gait or decreased balance    Time 6    Period Weeks    Status New    Target Date 09/27/19      PT LONG TERM GOAL #3   Title Patient will increase BERG score to 45 to decrease falls    Time 6    Period Weeks    Status New    Target Date 09/27/19                 Plan - 09/12/19 1107    Clinical Impression Statement Patient seemed generally fatigued today. She reported fatigue prior to standing activity. She has had increased knee pain. Patients' left patellar movement is good. She had no significant tenderness to palpation. She was adivsed of RICE. She tolerated balance exercises with minor pain but did have increased pain with tandem stance.    Personal Factors and Comorbidities Comorbidity 1;Comorbidity 2    Comorbidities bilateral knee OA, DMII    Examination-Activity Limitations Sit;Transfers;Squat;Lift;Locomotion Level;Carry;Stand    Examination-Participation Restrictions Shop;Laundry;Cleaning;Driving    Stability/Clinical Decision Making Evolving/Moderate complexity    Clinical Decision Making Moderate    Rehab Potential Good    PT Frequency 2x / week    PT Treatment/Interventions ADLs/Self Care Home Management;Cryotherapy;Electrical Stimulation;Moist Heat;Functional mobility training;Therapeutic activities;Therapeutic exercise;Patient/family education;Balance training;Neuromuscular re-education;Manual techniques;Passive range of motion;Taping    PT Next Visit Plan review HEP; consider supine core strengthening and ther-ex with a progression to standing; startstanding balance; progress from floor -> air-ex as tolerated.    PT Home Exercise Plan Access Code: [email or web address].[email or web address] Long Arc Quad (90-45 Degree Range) - 2 x daily - 7 x weekly - 3 sets - 10 reps.Seated Hip Abduction with  Resistance - 2 x daily - 7 x weekly -  3 sets - 10 reps.Seated March - 2 x daily - 7 x weekly - 3 sets - 10 reps    Consulted and Agree with Plan of Care Patient           Patient will benefit from skilled therapeutic intervention in order to improve the following deficits and impairments:  Abnormal gait, Pain, Decreased activity tolerance, Decreased safety awareness, Decreased range of motion, Increased muscle spasms, Difficulty walking, Decreased endurance, Impaired perceived functional ability, Increased fascial restricitons, Decreased strength  Visit Diagnosis: Other abnormalities of gait and mobility  Difficulty in walking, not elsewhere classified  Muscle weakness (generalized)  Chronic pain of left knee  Chronic pain of right knee     Problem List Patient Active Problem List   Diagnosis Date Noted  . Hypercalcemia 07/20/2019  . Pituitary macroadenoma ([place]) 07/18/2019    Carney Living PT DPT  09/12/2019, 12:17 PM  [facility] [address] [place], [place], [address] Phone: [phone]   Fax:  [phone]  Name: Elaine Burke MRN: [id number] Date of Birth: [birth year]

## 2019-09-14 ENCOUNTER — Other Ambulatory Visit: Payer: Self-pay

## 2019-09-14 ENCOUNTER — Ambulatory Visit: Payer: Medicare Other | Admitting: Physical Therapy

## 2019-09-14 ENCOUNTER — Encounter: Payer: Self-pay | Admitting: Physical Therapy

## 2019-09-14 DIAGNOSIS — R2689 Other abnormalities of gait and mobility: Secondary | ICD-10-CM

## 2019-09-14 DIAGNOSIS — G8929 Other chronic pain: Secondary | ICD-10-CM

## 2019-09-14 DIAGNOSIS — M6281 Muscle weakness (generalized): Secondary | ICD-10-CM

## 2019-09-14 DIAGNOSIS — M25561 Pain in right knee: Secondary | ICD-10-CM

## 2019-09-14 DIAGNOSIS — M25562 Pain in left knee: Secondary | ICD-10-CM

## 2019-09-14 DIAGNOSIS — R262 Difficulty in walking, not elsewhere classified: Secondary | ICD-10-CM

## 2019-09-14 NOTE — Therapy (Addendum)
[place], [place], [address] Phone: [phone]   Fax:  [phone]  Physical Therapy Treatment  Patient Details  Name: Elaine Burke MRN: [id number] Date of Birth: September 23, 1950 Referring Provider (PT): Dr Hinda Lenis    Encounter Date: 09/14/2019   PT End of Session - 09/14/19 1334    Visit Number 6    Number of Visits 16    Date for PT Re-Evaluation 10/11/19    PT Start Time 1330    PT Stop Time 1414    PT Time Calculation (min) 44 min    Equipment Utilized During Treatment Gait belt    Activity Tolerance Patient tolerated treatment well    Behavior During Therapy [facility] for tasks assessed/performed           Past Medical History:  Diagnosis Date  . Arthritis   . Arthritis   . Diabetes mellitus without complication ([place])   . HLD (hyperlipidemia)   . Hypertension     Past Surgical History:  Procedure Laterality Date  . ABDOMINAL HYSTERECTOMY    . CESAREAN SECTION    . COLONOSCOPY      There were no vitals filed for this visit.   Subjective Assessment - 09/14/19 1332    Subjective Patient reports she is feeling much better today. She has been icing at home which she thinks has helped. She received shots for her knee on Tuesday.    Pertinent History Bilateral knee pain,    Limitations Standing;Walking    How long can you stand comfortably? can not stand up very long    How long can you walk comfortably? ocan not walk very far    Diagnostic tests Brain MRI: enlargement of the pituitary gland without mass effect on the optic nerve    Currently in Pain? No/denies    Pain Score 0-No pain    Pain Location Knee    Pain Orientation Left    Pain Descriptors / Indicators Aching                             [facility] Adult PT Treatment/Exercise - 09/14/19 0001      Ambulation/Gait   Gait Comments walking with cane 60 feet; min guard and cuing for technique       Neuro Re-ed    Neuro Re-ed Details  box stepping forward/backward x10 each way; 4-square stepping clockwise and counterclockwise x10 each way    CGA and cuing to get into a rhythm     Knee/Hip Exercises: Aerobic   Nustep L4 x 5 min      Knee/Hip Exercises: Standing   Other Standing Knee Exercises standing march at counter x20 reps      Knee/Hip Exercises: Supine   Bridges 2 sets;10 reps    Straight Leg Raises 10 reps;1 set    Other Supine Knee/Hip Exercises supine marches 2x10   1 lb ankle weights   Other Supine Knee/Hip Exercises supine hip abduction green 2x10                  PT Education - 09/14/19 1605    Education Details educated on proper use of cane and proper height for rolling walker; HEP    Person(s) Educated Patient    Methods Explanation;Demonstration;Tactile cues;Verbal cues    Comprehension Verbalized understanding;Returned demonstration;Verbal cues required;Tactile cues required            PT Short  Term Goals - 09/12/19 1214      PT SHORT TERM GOAL #1   Title Patient will increase gros bilateral LE strength to 4+/5    Baseline 5/5 gross     Time 3    Period Weeks    Status On-going    Target Date 09/06/19      PT SHORT TERM GOAL #2   Title Patient wil transfer sit to stand without the use of her hands    Baseline independnet with intial HEP     Time 3    Period Weeks    Status On-going    Target Date 09/06/19      PT SHORT TERM GOAL #3   Title Patient will ambualte 200' with LRAD without right knee hyper extension    Baseline worked on gait with cane    Time 3    Period Weeks    Status On-going    Target Date 09/06/19             PT Long Term Goals - 08/16/19 1551      PT LONG TERM GOAL #1   Title Patient will go up/down 8 steps without pain     Time 6    Period Weeks    Status New    Target Date 09/27/19      PT LONG TERM GOAL #2   Title Patient will ambalte 2000' with LRAD without antalgic gait or decreased balance     Time 6    Period Weeks    Status New    Target Date 09/27/19      PT LONG TERM GOAL #3   Title Patient will increase BERG score to 45 to decrease falls    Time 6    Period Weeks    Status New    Target Date 09/27/19                 Plan - 09/14/19 1606    Clinical Impression Statement Therapy continues to work on hip, quad, and glute strenghtening. 1 lb ankle weights were added for the supine march to progress the exercise, and pt tolerated the exercise well. Pt stated the 2lb weights were too difficult today. Pt was able to ambulate a further distance today with the cane with min guard assist and cuing for proper use of the cane. Therapy worked on LE coordination with 4-square stepping cuing to step in a rhythm. Pt's coordination improved from last session. Therapy will continue progressing exercises as tolerated.    Personal Factors and Comorbidities Comorbidity 1;Comorbidity 2    Comorbidities bilateral knee OA, DMII    Examination-Activity Limitations Sit;Transfers;Squat;Lift;Locomotion Level;Carry;Stand    Examination-Participation Restrictions Shop;Laundry;Cleaning;Driving    Stability/Clinical Decision Making Evolving/Moderate complexity    Clinical Decision Making Moderate    Rehab Potential Good    PT Frequency 2x / week    PT Duration 6 weeks    PT Treatment/Interventions ADLs/Self Care Home Management;Cryotherapy;Electrical Stimulation;Moist Heat;Functional mobility training;Therapeutic activities;Therapeutic exercise;Patient/family education;Balance training;Neuromuscular re-education;Manual techniques;Passive range of motion;Taping    PT Next Visit Plan review HEP; consider supine core strengthening and ther-ex with a progression to standing; startstanding balance; progress from floor -> air-ex as tolerated; ambulating with a cane    PT Home Exercise Plan Access Code: [email or web address].Seated Long Arc Quad (90-45 Degree Range) - 2 x daily - 7 x weekly - 3 sets - 10  reps.Seated Hip Abduction with Resistance - 2 x daily - 7 x weekly - 3  sets - 10 reps.Seated March - 2 x daily - 7 x weekly - 3 sets - 10 reps    Consulted and Agree with Plan of Care Patient           Patient will benefit from skilled therapeutic intervention in order to improve the following deficits and impairments:  Abnormal gait, Pain, Decreased activity tolerance, Decreased safety awareness, Decreased range of motion, Increased muscle spasms, Difficulty walking, Decreased endurance, Impaired perceived functional ability, Increased fascial restricitons, Decreased strength  Visit Diagnosis: Other abnormalities of gait and mobility  Difficulty in walking, not elsewhere classified  Muscle weakness (generalized)  Chronic pain of left knee  Chronic pain of right knee     Problem List Patient Active Problem List   Diagnosis Date Noted  . Hypercalcemia 07/20/2019  . Pituitary macroadenoma ([place]) 07/18/2019   Carolyne Littles PT DPT  09/14/2019   Minna Merritts SPT 09/14/2019, 4:17 PM  [facility] [address] [place], [place], [address] Phone: [phone]   Fax:  [phone]  Name: Elaine Burke MRN: [id number] Date of Birth: Jul 14, 1950

## 2019-09-15 ENCOUNTER — Encounter: Payer: Self-pay | Admitting: Physical Therapy

## 2019-09-19 ENCOUNTER — Ambulatory Visit: Payer: Medicare Other | Admitting: Physical Therapy

## 2019-09-19 ENCOUNTER — Encounter: Payer: Self-pay | Admitting: Physical Therapy

## 2019-09-19 ENCOUNTER — Other Ambulatory Visit: Payer: Self-pay

## 2019-09-19 DIAGNOSIS — M25562 Pain in left knee: Secondary | ICD-10-CM

## 2019-09-19 DIAGNOSIS — R2689 Other abnormalities of gait and mobility: Secondary | ICD-10-CM | POA: Diagnosis not present

## 2019-09-19 DIAGNOSIS — R262 Difficulty in walking, not elsewhere classified: Secondary | ICD-10-CM

## 2019-09-19 DIAGNOSIS — G8929 Other chronic pain: Secondary | ICD-10-CM

## 2019-09-19 DIAGNOSIS — M6281 Muscle weakness (generalized): Secondary | ICD-10-CM

## 2019-09-20 NOTE — Therapy (Signed)
[place], [place], [address] Phone: [phone]   Fax:  [phone]  Physical Therapy Treatment  Patient Details  Name: Elaine Burke MRN: [id number] Date of Birth: 23-Aug-1950 Referring Provider (PT): Dr Hinda Lenis    Encounter Date: 09/19/2019   PT End of Session - 09/19/19 0932    Visit Number 7    Number of Visits 16    Date for PT Re-Evaluation 10/11/19    PT Start Time 0929    PT Stop Time 1012    PT Time Calculation (min) 43 min    Equipment Utilized During Treatment Gait belt    Activity Tolerance Patient tolerated treatment well    Behavior During Therapy WFL for tasks assessed/performed           Past Medical History:  Diagnosis Date   Arthritis    Arthritis    Diabetes mellitus without complication ([place])    HLD (hyperlipidemia)    Hypertension     Past Surgical History:  Procedure Laterality Date   ABDOMINAL HYSTERECTOMY     CESAREAN SECTION     COLONOSCOPY      There were no vitals filed for this visit.   Subjective Assessment - 09/19/19 0931    Subjective Patient reports she is feeling okay today. States her walker height feels better after raising it last session.    Pertinent History Bilateral knee pain,    Limitations Standing;Walking    How long can you stand comfortably? can not stand up very long    How long can you walk comfortably? ocan not walk very far    Diagnostic tests Brain MRI: enlargement of the pituitary gland without mass effect on the optic nerve    Patient Stated Goals better balance, be able to walk further    Currently in Pain? No/denies    Pain Score 0-No pain    Pain Location Knee    Pain Type Chronic pain    Pain Onset More than a month ago              [facility] PT Assessment - 09/20/19 0001      Ambulation/Gait   Gait Comments walking with cane 159ft with CGA                         [facility] Adult PT  Treatment/Exercise - 09/20/19 0001      Neuro Re-ed    Neuro Re-ed Details  box stepping forward/back x10; 4-square stepping clockwise/counterclockwise x10 each way      Knee/Hip Exercises: Aerobic   Nustep L5x52min      Knee/Hip Exercises: Standing   Terminal Knee Extension 2 sets;10 reps   yellow   Other Standing Knee Exercises standing march at counter x20 reps   cuing to pick feet up off the floor     Knee/Hip Exercises: Supine   Bridges 2 sets;10 reps    Straight Leg Raises 1 set;15 reps;Left;Right    Other Supine Knee/Hip Exercises supine marches 2x10   1 lb ankle weights   Other Supine Knee/Hip Exercises supine hip abduction green 2x10                  PT Education - 09/19/19 1337    Education Details HEP    Person(s) Educated Patient    Methods Explanation;Demonstration;Tactile cues;Verbal cues    Comprehension Tactile cues required;Verbal cues required;Returned demonstration;Verbalized understanding  PT Short Term Goals - 09/12/19 1214      PT SHORT TERM GOAL #1   Title Patient will increase gros bilateral LE strength to 4+/5    Baseline 5/5 gross     Time 3    Period Weeks    Status On-going    Target Date 09/06/19      PT SHORT TERM GOAL #2   Title Patient wil transfer sit to stand without the use of her hands    Baseline independnet with intial HEP     Time 3    Period Weeks    Status On-going    Target Date 09/06/19      PT SHORT TERM GOAL #3   Title Patient will ambualte 200' with LRAD without right knee hyper extension    Baseline worked on gait with cane    Time 3    Period Weeks    Status On-going    Target Date 09/06/19             PT Long Term Goals - 08/16/19 1551      PT LONG TERM GOAL #1   Title Patient will go up/down 8 steps without pain     Time 6    Period Weeks    Status New    Target Date 09/27/19      PT LONG TERM GOAL #2   Title Patient will ambalte 2000' with LRAD without antalgic gait or decreased  balance    Time 6    Period Weeks    Status New    Target Date 09/27/19      PT LONG TERM GOAL #3   Title Patient will increase BERG score to 45 to decrease falls    Time 6    Period Weeks    Status New    Target Date 09/27/19                 Plan - 09/19/19 1338    Clinical Impression Statement Therapy continues strenghtening quads, glutes, and hip and bilateral LE coordination. Continued working on ambulating with cane and pt was able to ambulate a further distance with more coodination. Pt continues to have imbalance when using cane and states it "feels awkward". Terminal knee extensions were added to work on LLE control of leg straightening when ambulating. Patient was fatigued at the end of session, most likely due to increased ambulating with cane. Therapy will continue progressing execises and practicing ambulation with cane.    Personal Factors and Comorbidities Comorbidity 1;Comorbidity 2    Comorbidities bilateral knee OA, DMII    Examination-Activity Limitations Sit;Transfers;Squat;Lift;Locomotion Level;Carry;Stand    Examination-Participation Restrictions Shop;Laundry;Cleaning;Driving    Stability/Clinical Decision Making Evolving/Moderate complexity    Clinical Decision Making Moderate    Rehab Potential Good    PT Frequency 2x / week    PT Duration 6 weeks    PT Treatment/Interventions ADLs/Self Care Home Management;Cryotherapy;Electrical Stimulation;Moist Heat;Functional mobility training;Therapeutic activities;Therapeutic exercise;Patient/family education;Balance training;Neuromuscular re-education;Manual techniques;Passive range of motion;Taping    PT Next Visit Plan review HEP; consider supine core strengthening and ther-ex with a progression to standing; startstanding balance; progress from floor -> air-ex as tolerated; ambulating with a cane    PT Home Exercise Plan Access Code: [email or web address] Long Arc Quad (90-45 Degree Range) - 2 x daily - 7 x weekly  - 3 sets - 10 repsSeated Hip Abduction with Resistance - 2 x daily - 7 x weekly - 3 sets - 10 repsSeated March -  2 x daily - 7 x weekly - 3 sets - 10 reps    Consulted and Agree with Plan of Care Patient           Patient will benefit from skilled therapeutic intervention in order to improve the following deficits and impairments:  Abnormal gait, Pain, Decreased activity tolerance, Decreased safety awareness, Decreased range of motion, Increased muscle spasms, Difficulty walking, Decreased endurance, Impaired perceived functional ability, Increased fascial restricitons, Decreased strength  Visit Diagnosis: Other abnormalities of gait and mobility  Difficulty in walking, not elsewhere classified  Muscle weakness (generalized)  Chronic pain of left knee  Chronic pain of right knee     Problem List Patient Active Problem List   Diagnosis Date Noted   Hypercalcemia 07/20/2019   Pituitary macroadenoma ([place]) 07/18/2019    Carney Living PT DPT  09/20/2019, 8:15 AM   Minna Merritts SPT  09/20/2019   During this treatment session, the therapist was present, participating in and directing the treatment.  [place], [place], [address] Phone: [phone]   Fax:  [phone]  Name: Cindie Rajagopalan MRN: [id number] Date of Birth: [birth year]

## 2019-10-02 ENCOUNTER — Other Ambulatory Visit: Payer: Self-pay

## 2019-10-02 ENCOUNTER — Encounter: Payer: Self-pay | Admitting: Physical Therapy

## 2019-10-02 ENCOUNTER — Ambulatory Visit: Payer: Medicare Other | Admitting: Physical Therapy

## 2019-10-02 DIAGNOSIS — R262 Difficulty in walking, not elsewhere classified: Secondary | ICD-10-CM

## 2019-10-02 DIAGNOSIS — G8929 Other chronic pain: Secondary | ICD-10-CM

## 2019-10-02 DIAGNOSIS — M6281 Muscle weakness (generalized): Secondary | ICD-10-CM

## 2019-10-02 DIAGNOSIS — M25561 Pain in right knee: Secondary | ICD-10-CM

## 2019-10-02 DIAGNOSIS — R2689 Other abnormalities of gait and mobility: Secondary | ICD-10-CM | POA: Diagnosis not present

## 2019-10-03 NOTE — Therapy (Signed)
[place], [place], [address] Phone: [phone]   Fax:  [phone]  Physical Therapy Treatment  Patient Details  Name: Elaine Burke MRN: [id number] Date of Birth: 1950-06-01 Referring Provider (PT): Dr Hinda Lenis    Encounter Date: 10/02/2019   PT End of Session - 10/02/19 1336    Visit Number 8    Number of Visits 16    Date for PT Re-Evaluation 10/11/19    Authorization Type [employer] Medicare    PT Start Time 1331    PT Stop Time 1414    PT Time Calculation (min) 43 min    Equipment Utilized During Treatment Gait belt    Activity Tolerance Patient tolerated treatment well    Behavior During Therapy [facility] for tasks assessed/performed           Past Medical History:  Diagnosis Date  . Arthritis   . Arthritis   . Diabetes mellitus without complication ([place])   . HLD (hyperlipidemia)   . Hypertension     Past Surgical History:  Procedure Laterality Date  . ABDOMINAL HYSTERECTOMY    . CESAREAN SECTION    . COLONOSCOPY      There were no vitals filed for this visit.   Subjective Assessment - 10/02/19 1334    Subjective Patient reports her knee is bothering her more today. She does not know why this pain increased, but tried to stay off of it yesterday to rest.    Pertinent History Bilateral knee pain,    Limitations Standing;Walking    How long can you stand comfortably? can not stand up very long    How long can you walk comfortably? ocan not walk very far    Diagnostic tests Brain MRI: enlargement of the pituitary gland without mass effect on the optic nerve    Patient Stated Goals better balance, be able to walk further    Currently in Pain? Yes    Pain Score 2     Pain Location Knee    Pain Orientation Left    Pain Descriptors / Indicators Aching    Pain Type Chronic pain    Pain Onset More than a month ago    Pain Frequency Constant    Aggravating Factors  standing and  walking    Pain Relieving Factors rest                             [facility] Adult PT Treatment/Exercise - 10/03/19 0001      Ambulation/Gait   Gait Comments walking with cane 147ft with CGA      Knee/Hip Exercises: Aerobic   Nustep L4x40min      Knee/Hip Exercises: Standing   Hip Flexion 1 set;15 reps;Left;Right   marches   Hip Abduction 1 set;15 reps;Left;Right    Hip Extension 15 reps;Left;Right    Lateral Step Up 10 reps;Left;Right;Step Height: 4"    Forward Step Up 15 reps;Right;Left;Step Height: 4"      Knee/Hip Exercises: Supine   Bridges 2 sets;15 reps    Straight Leg Raises 1 set;15 reps;Right;Left    Other Supine Knee/Hip Exercises supine march 1x15 each leg   1lb ankle weight; cuing for tight core   Other Supine Knee/Hip Exercises supine hip abduction green x30                  PT Education - 10/02/19 1750    Education Details  HEP and symptom management    Person(s) Educated Patient    Methods Explanation;Demonstration;Verbal cues;Tactile cues    Comprehension Verbal cues required;Tactile cues required;Returned demonstration;Verbalized understanding            PT Short Term Goals - 09/12/19 1214      PT SHORT TERM GOAL #1   Title Patient will increase gros bilateral LE strength to 4+/5    Baseline 5/5 gross     Time 3    Period Weeks    Status On-going    Target Date 09/06/19      PT SHORT TERM GOAL #2   Title Patient wil transfer sit to stand without the use of her hands    Baseline independnet with intial HEP     Time 3    Period Weeks    Status On-going    Target Date 09/06/19      PT SHORT TERM GOAL #3   Title Patient will ambualte 200' with LRAD without right knee hyper extension    Baseline worked on gait with cane    Time 3    Period Weeks    Status On-going    Target Date 09/06/19             PT Long Term Goals - 08/16/19 1551      PT LONG TERM GOAL #1   Title Patient will go up/down 8 steps without pain      Time 6    Period Weeks    Status New    Target Date 09/27/19      PT LONG TERM GOAL #2   Title Patient will ambalte 2000' with LRAD without antalgic gait or decreased balance    Time 6    Period Weeks    Status New    Target Date 09/27/19      PT LONG TERM GOAL #3   Title Patient will increase BERG score to 45 to decrease falls    Time 6    Period Weeks    Status New    Target Date 09/27/19                 Plan - 10/02/19 1750    Clinical Impression Statement Patient reported having a little more pain today than previous sessions. Therapy focused on gait training and strengthening exercises. Patient continues to ambulate a further distance each session with a single point cane. Her coordination with the cane is improving and continues to move at a safe pace. Therapy working on standing hip and quad strenghtening exericses. Patient tolerated treatment well and stated she felt good at the end of the session.    Personal Factors and Comorbidities Comorbidity 1;Comorbidity 2    Comorbidities bilateral knee OA, DMII    Examination-Activity Limitations Sit;Transfers;Squat;Lift;Locomotion Level;Carry;Stand    Stability/Clinical Decision Making Evolving/Moderate complexity    Clinical Decision Making Moderate    Rehab Potential Good    PT Frequency 2x / week    PT Duration 6 weeks    PT Treatment/Interventions ADLs/Self Care Home Management;Cryotherapy;Electrical Stimulation;Moist Heat;Functional mobility training;Therapeutic activities;Therapeutic exercise;Patient/family education;Balance training;Neuromuscular re-education;Manual techniques;Passive range of motion;Taping    PT Next Visit Plan review HEP; consider supine core strengthening and ther-ex with a progression to standing; startstanding balance; progress from floor -> air-ex as tolerated; ambulating with a cane    PT Home Exercise Plan Access Code: [email or web address].Seated Long Arc Quad (90-45 Degree Range) - 2 x daily -  7 x weekly - 3 sets -  10 reps.Seated Hip Abduction with Resistance - 2 x daily - 7 x weekly - 3 sets - 10 reps.Seated March - 2 x daily - 7 x weekly - 3 sets - 10 reps    Consulted and Agree with Plan of Care Patient           Patient will benefit from skilled therapeutic intervention in order to improve the following deficits and impairments:  Abnormal gait, Pain, Decreased activity tolerance, Decreased safety awareness, Decreased range of motion, Increased muscle spasms, Difficulty walking, Decreased endurance, Impaired perceived functional ability, Increased fascial restricitons, Decreased strength  Visit Diagnosis: Other abnormalities of gait and mobility  Difficulty in walking, not elsewhere classified  Muscle weakness (generalized)  Chronic pain of left knee  Chronic pain of right knee     Problem List Patient Active Problem List   Diagnosis Date Noted  . Hypercalcemia 07/20/2019  . Pituitary macroadenoma ([place]) 07/18/2019    Carney Living PT DPT  10/03/2019, 9:24 AM   Minna Merritts SPT  10/03/2019  During this treatment session, the therapist was present, participating in and directing the treatment.    [place], [place], [address] Phone: [phone]   Fax:  [phone]  Name: Elaine Burke MRN: [id number] Date of Birth: [birth year]

## 2019-10-04 ENCOUNTER — Ambulatory Visit: Payer: Medicare Other | Admitting: Physical Therapy

## 2019-10-04 ENCOUNTER — Other Ambulatory Visit: Payer: Self-pay

## 2019-10-04 ENCOUNTER — Encounter: Payer: Self-pay | Admitting: Physical Therapy

## 2019-10-04 DIAGNOSIS — R2689 Other abnormalities of gait and mobility: Secondary | ICD-10-CM | POA: Diagnosis not present

## 2019-10-04 DIAGNOSIS — M6281 Muscle weakness (generalized): Secondary | ICD-10-CM

## 2019-10-04 DIAGNOSIS — R262 Difficulty in walking, not elsewhere classified: Secondary | ICD-10-CM

## 2019-10-04 DIAGNOSIS — M25562 Pain in left knee: Secondary | ICD-10-CM

## 2019-10-04 DIAGNOSIS — G8929 Other chronic pain: Secondary | ICD-10-CM

## 2019-10-04 NOTE — Therapy (Signed)
[place], [place], [address] Phone: [phone]   Fax:  [phone]  Physical Therapy Treatment  Patient Details  Name: Elaine Burke MRN: [id number] Date of Birth: 03-21-1950 Referring Provider (PT): Dr Hinda Lenis    Encounter Date: 10/04/2019   PT End of Session - 10/04/19 1539    Visit Number 9    Number of Visits 16    Date for PT Re-Evaluation 10/11/19    Authorization Type UHC Medicare    PT Start Time 1500    PT Stop Time 1540    PT Time Calculation (min) 40 min    Equipment Utilized During Treatment Gait belt    Activity Tolerance Patient tolerated treatment well           Past Medical History:  Diagnosis Date  . Arthritis   . Arthritis   . Diabetes mellitus without complication ([place])   . HLD (hyperlipidemia)   . Hypertension     Past Surgical History:  Procedure Laterality Date  . ABDOMINAL HYSTERECTOMY    . CESAREAN SECTION    . COLONOSCOPY      There were no vitals filed for this visit.   Subjective Assessment - 10/04/19 1506    Subjective Patient has no complaints today. She reports mild fatigue after the last visit.    Pertinent History Bilateral knee pain,    Limitations Standing;Walking    How long can you walk comfortably? ocan not walk very far    Diagnostic tests Brain MRI: enlargement of the pituitary gland without mass effect on the optic nerve    Patient Stated Goals better balance, be able to walk further    Currently in Pain? No/denies                             [facility] Adult PT Treatment/Exercise - 10/04/19 0001      Neuro Re-ed    Neuro Re-ed Details  step onto air-ex 2x10; narrow base on air-ex 2x30 sec eyes closed 2x30 sec hold; step over hurlde 10x each way; step forward 2x10;       Knee/Hip Exercises: Supine   Bridges 2 sets;15 reps    Straight Leg Raises 1 set;15 reps;Right;Left    Other Supine Knee/Hip Exercises  supine march 1x15 each leg   1lb ankle weight; cuing for tight core   Other Supine Knee/Hip Exercises supine hip abduction green x30                  PT Education - 10/04/19 1507    Education Details reviwwed tehcnique with ther-ex    Person(s) Educated Patient    Methods Explanation;Verbal cues;Demonstration;Tactile cues    Comprehension Verbalized understanding;Returned demonstration;Verbal cues required;Tactile cues required            PT Short Term Goals - 10/04/19 1605      PT SHORT TERM GOAL #1   Title Patient will increase gros bilateral LE strength to 4+/5    Baseline 5/5 gross     Time 3    Period Weeks    Status On-going    Target Date 09/06/19      PT SHORT TERM GOAL #2   Title Patient wil transfer sit to stand without the use of her hands    Baseline independnet with intial HEP     Time 3    Period Weeks    Target Date 09/06/19  PT SHORT TERM GOAL #3   Title Patient will ambualte 200' with LRAD without right knee hyper extension    Baseline worked on gait with cane    Time 3    Period Weeks    Status On-going    Target Date 09/06/19             PT Long Term Goals - 08/16/19 1551      PT LONG TERM GOAL #1   Title Patient will go up/down 8 steps without pain     Time 6    Period Weeks    Status New    Target Date 09/27/19      PT LONG TERM GOAL #2   Title Patient will ambalte 2000' with LRAD without antalgic gait or decreased balance    Time 6    Period Weeks    Status New    Target Date 09/27/19      PT LONG TERM GOAL #3   Title Patient will increase BERG score to 45 to decrease falls    Time 6    Period Weeks    Status New    Target Date 09/27/19                 Plan - 10/04/19 1600    Clinical Impression Statement Patient tolerated treatment well. She had a mild deficit in balance with eyes closed on air-ex> She did much better her second trial. She reported no knee pain with treatment. Therapy will continue to  advance as tolerated.    Personal Factors and Comorbidities Comorbidity 1;Comorbidity 2    Comorbidities bilateral knee OA, DMII    Examination-Activity Limitations Sit;Transfers;Squat;Lift;Locomotion Level;Carry;Stand    Stability/Clinical Decision Making Evolving/Moderate complexity    Clinical Decision Making Moderate    Rehab Potential Good    PT Duration 6 weeks    PT Treatment/Interventions ADLs/Self Care Home Management;Cryotherapy;Electrical Stimulation;Moist Heat;Functional mobility training;Therapeutic activities;Therapeutic exercise;Patient/family education;Balance training;Neuromuscular re-education;Manual techniques;Passive range of motion;Taping    PT Next Visit Plan review HEP; consider supine core strengthening and ther-ex with a progression to standing; startstanding balance; progress from floor -> air-ex as tolerated; ambulating with a cane; Re-assess patient    PT Home Exercise Plan Access Code: [email or web address].Seated Long Arc Quad (90-45 Degree Range) - 2 x daily - 7 x weekly - 3 sets - 10 reps.Seated Hip Abduction with Resistance - 2 x daily - 7 x weekly - 3 sets - 10 reps.Seated March - 2 x daily - 7 x weekly - 3 sets - 10 reps    Consulted and Agree with Plan of Care Patient           Patient will benefit from skilled therapeutic intervention in order to improve the following deficits and impairments:  Abnormal gait, Pain, Decreased activity tolerance, Decreased safety awareness, Decreased range of motion, Increased muscle spasms, Difficulty walking, Decreased endurance, Impaired perceived functional ability, Increased fascial restricitons, Decreased strength  Visit Diagnosis: Other abnormalities of gait and mobility  Difficulty in walking, not elsewhere classified  Muscle weakness (generalized)  Chronic pain of left knee  Chronic pain of right knee     Problem List Patient Active Problem List   Diagnosis Date Noted  . Hypercalcemia 07/20/2019  .  Pituitary macroadenoma ([place]) 07/18/2019    Carney Living PT DPT  10/04/2019, 4:10 PM  [facility] [address] [place], [place], [address] Phone: [phone]   Fax:  [phone]  Name: Elaine Lehenbauer  Burke MRN: [id number] Date of Birth: 01/26/1951

## 2019-10-09 ENCOUNTER — Other Ambulatory Visit: Payer: Self-pay

## 2019-10-09 ENCOUNTER — Ambulatory Visit: Payer: Medicare Other | Attending: Family Medicine | Admitting: Physical Therapy

## 2019-10-09 ENCOUNTER — Encounter: Payer: Self-pay | Admitting: Physical Therapy

## 2019-10-09 DIAGNOSIS — R2689 Other abnormalities of gait and mobility: Secondary | ICD-10-CM

## 2019-10-09 DIAGNOSIS — M6281 Muscle weakness (generalized): Secondary | ICD-10-CM

## 2019-10-09 DIAGNOSIS — M25561 Pain in right knee: Secondary | ICD-10-CM | POA: Diagnosis present

## 2019-10-09 DIAGNOSIS — R262 Difficulty in walking, not elsewhere classified: Secondary | ICD-10-CM

## 2019-10-09 DIAGNOSIS — G8929 Other chronic pain: Secondary | ICD-10-CM | POA: Diagnosis present

## 2019-10-09 DIAGNOSIS — M25562 Pain in left knee: Secondary | ICD-10-CM | POA: Insufficient documentation

## 2019-10-10 NOTE — Therapy (Addendum)
[place], [place], [address] Phone: [phone]   Fax:  [phone]  Physical Therapy Treatment/Progress Note  Patient Details  Name: Elaine Burke MRN: [id number] Date of Birth: 12-27-1950 Referring Provider (PT): Dr Hinda Lenis   Progress Note Reporting Period  08/16/2019 to 10/10/2019  See note below for Objective Data and Assessment of Progress/Goals.       Encounter Date: 10/09/2019   PT End of Session - 10/09/19 1331    Visit Number 10    Number of Visits 22    Date for PT Re-Evaluation 11/21/19    Authorization Type UHC Medicare    PT Start Time 1331    PT Stop Time 1414    PT Time Calculation (min) 43 min    Equipment Utilized During Treatment Gait belt    Activity Tolerance Patient tolerated treatment well    Behavior During Therapy WFL for tasks assessed/performed           Past Medical History:  Diagnosis Date  . Arthritis   . Arthritis   . Diabetes mellitus without complication ([place])   . HLD (hyperlipidemia)   . Hypertension     Past Surgical History:  Procedure Laterality Date  . ABDOMINAL HYSTERECTOMY    . CESAREAN SECTION    . COLONOSCOPY      There were no vitals filed for this visit.   Subjective Assessment - 10/09/19 1728    Subjective Pt states she is doing well. She continues to have no complaints.    Pertinent History Bilateral knee pain,    How long can you stand comfortably? can not stand up very long    How long can you walk comfortably? ocan not walk very far    Diagnostic tests Brain MRI: enlargement of the pituitary gland without mass effect on the optic nerve    Patient Stated Goals better balance, be able to walk further    Currently in Pain? No/denies    Pain Score 0-No pain    Pain Orientation Left    Pain Type Chronic pain    Pain Onset More than a month ago    Aggravating Factors  standing and wlaking    Pain Relieving Factors rest               [facility] PT Assessment - 10/10/19 0001      Strength   Right Hip Flexion 4+/5    Right Hip ABduction 4+/5    Left Hip Flexion 4+/5    Left Hip ABduction 4+/5      Berg Balance Test   Sit to Stand Able to stand  independently using hands    Standing Unsupported Able to stand safely 2 minutes    Sitting with Back Unsupported but Feet Supported on Floor or Stool Able to sit safely and securely 2 minutes    Stand to Sit Controls descent by using hands    Transfers Able to transfer safely, definite need of hands    Standing Unsupported with Eyes Closed Able to stand 10 seconds with supervision    Standing Unsupported with Feet Together Able to place feet together independently and stand for 1 minute with supervision    From Standing, Reach Forward with Outstretched Arm Can reach forward >12 cm safely (5")    From Standing Position, Pick up Object from Floor Able to pick up shoe, needs supervision    From Standing Position, Turn to Look Behind Over each Shoulder  Looks behind from both sides and weight shifts well    Turn 360 Degrees Able to turn 360 degrees safely but slowly    Standing Unsupported, Alternately Place Feet on Step/Stool Needs assistance to keep from falling or unable to try    Standing Unsupported, One Foot in [employer] balance while stepping or standing    Standing on One Leg Unable to try or needs assist to prevent fall    Total Score 35                         [facility] Adult PT Treatment/Exercise - 10/10/19 0001      Transfers   Five time sit to stand comments  17.5      Ambulation/Gait   Gait Comments 29m walk test 8 seconds      Knee/Hip Exercises: Aerobic   Nustep L4x5 min      Knee/Hip Exercises: Seated   Long Arc Quad 2 sets;10 reps   1 lb   Hamstring Curl 2 sets;10 reps   green band     Knee/Hip Exercises: Supine   Bridges 3 sets;10 reps    Straight Leg Raises 1 set;15 reps;Right;Left    Other Supine Knee/Hip Exercises supine  march 1x15 each leg   1lb ankle weight; cuing for tight core                 PT Education - 10/10/19 0751    Education Details HEP; balance education    Person(s) Educated Patient    Methods Explanation;Demonstration;Tactile cues;Verbal cues    Comprehension Tactile cues required;Verbal cues required;Returned demonstration;Verbalized understanding            PT Short Term Goals - 10/04/19 1605      PT SHORT TERM GOAL #1   Title Patient will increase gros bilateral LE strength to 4+/5    Baseline 5/5 gross     Time 3    Period Weeks    Status On-going    Target Date 09/06/19      PT SHORT TERM GOAL #2   Title Patient wil transfer sit to stand without the use of her hands    Baseline independnet with intial HEP     Time 3    Period Weeks    Target Date 09/06/19      PT SHORT TERM GOAL #3   Title Patient will ambualte 200' with LRAD without right knee hyper extension    Baseline worked on gait with cane    Time 3    Period Weeks    Status On-going    Target Date 09/06/19             PT Long Term Goals - 08/16/19 1551      PT LONG TERM GOAL #1   Title Patient will go up/down 8 steps without pain     Time 6    Period Weeks    Status New    Target Date 09/27/19      PT LONG TERM GOAL #2   Title Patient will ambalte 2000' with LRAD without antalgic gait or decreased balance    Time 6    Period Weeks    Status New    Target Date 09/27/19      PT LONG TERM GOAL #3   Title Patient will increase BERG score to 45 to decrease falls    Time 6    Period Weeks    Status  New    Target Date 09/27/19                 Plan - 10/09/19 1729    Clinical Impression Statement Patient showed improved balance with an increased score on her Berg balance test. Her 5 time sit to stand and 5 m walk test show a deficit in gait speed. Her strength improved on bilateral LE to 4+/5. Therapy continued to work on hip, glute and LE strengthening. Pt has improved with  therapy but continues to have balance deficits and remains in the high falls risk category. Pt will continue to benefit from skilled therapy for bilateral LE strenghtening and balance training.    Personal Factors and Comorbidities Comorbidity 1;Comorbidity 2    Comorbidities bilateral knee OA, DMII    Examination-Activity Limitations Sit;Transfers;Squat;Lift;Locomotion Level;Carry;Stand    Examination-Participation Restrictions Shop;Laundry;Cleaning;Driving    Stability/Clinical Decision Making Evolving/Moderate complexity    Clinical Decision Making Moderate    Rehab Potential Good    PT Frequency 2x / week    PT Duration 6 weeks    PT Treatment/Interventions ADLs/Self Care Home Management;Cryotherapy;Electrical Stimulation;Moist Heat;Functional mobility training;Therapeutic activities;Therapeutic exercise;Patient/family education;Balance training;Neuromuscular re-education;Manual techniques;Passive range of motion;Taping    PT Next Visit Plan review HEP; consider supine core strengthening and ther-ex with a progression to standing; startstanding balance; progress from floor -> air-ex as tolerated; ambulating with a cane; Re-assess patient    PT Home Exercise Plan Access Code: [email or web address].Seated Long Arc Quad (90-45 Degree Range) - 2 x daily - 7 x weekly - 3 sets - 10 reps.Seated Hip Abduction with Resistance - 2 x daily - 7 x weekly - 3 sets - 10 reps.Seated March - 2 x daily - 7 x weekly - 3 sets - 10 reps    Consulted and Agree with Plan of Care Patient           Patient will benefit from skilled therapeutic intervention in order to improve the following deficits and impairments:  Abnormal gait, Pain, Decreased activity tolerance, Decreased safety awareness, Decreased range of motion, Increased muscle spasms, Difficulty walking, Decreased endurance, Impaired perceived functional ability, Increased fascial restricitons, Decreased strength  Visit Diagnosis: Other abnormalities of gait  and mobility  Difficulty in walking, not elsewhere classified  Muscle weakness (generalized)  Chronic pain of left knee  Chronic pain of right knee     Problem List Patient Active Problem List   Diagnosis Date Noted  . Hypercalcemia 07/20/2019  . Pituitary macroadenoma ([place]) 07/18/2019    Carney Living PT DPT  10/10/2019, 8:00 AM   Minna Merritts SPT  8/3/2p21  During this treatment session, the therapist was present, participating in and directing the treatment.   [place], [place], [address] Phone: [phone]   Fax:  [phone]  Name: Elaine Burke MRN: [id number] Date of Birth: 1950/11/08

## 2019-10-10 NOTE — Addendum Note (Signed)
Addended by: Carney Living on: 10/10/2019 08:03 AM   Modules accepted: Orders

## 2019-10-11 ENCOUNTER — Encounter: Payer: Self-pay | Admitting: Physical Therapy

## 2019-10-11 ENCOUNTER — Other Ambulatory Visit: Payer: Self-pay

## 2019-10-11 ENCOUNTER — Ambulatory Visit: Payer: Medicare Other | Admitting: Physical Therapy

## 2019-10-11 DIAGNOSIS — M25562 Pain in left knee: Secondary | ICD-10-CM

## 2019-10-11 DIAGNOSIS — R262 Difficulty in walking, not elsewhere classified: Secondary | ICD-10-CM

## 2019-10-11 DIAGNOSIS — R2689 Other abnormalities of gait and mobility: Secondary | ICD-10-CM

## 2019-10-11 DIAGNOSIS — M6281 Muscle weakness (generalized): Secondary | ICD-10-CM

## 2019-10-11 DIAGNOSIS — G8929 Other chronic pain: Secondary | ICD-10-CM

## 2019-10-12 ENCOUNTER — Encounter: Payer: Self-pay | Admitting: Physical Therapy

## 2019-10-12 NOTE — Therapy (Signed)
[place], [place], [address] Phone: [phone]   Fax:  [phone]  Physical Therapy Treatment  Patient Details  Name: Elaine Burke MRN: [id number] Date of Birth: 1950-07-26 Referring Provider (PT): Dr Hinda Lenis    Encounter Date: 10/11/2019   PT End of Session - 10/11/19 1331    Visit Number 11    Number of Visits 22    Date for PT Re-Evaluation 11/21/19    Authorization Type UHC Medicare    PT Start Time 1331    PT Stop Time 1413    PT Time Calculation (min) 42 min    Equipment Utilized During Treatment Gait belt    Activity Tolerance Patient tolerated treatment well    Behavior During Therapy [facility] for tasks assessed/performed           Past Medical History:  Diagnosis Date  . Arthritis   . Arthritis   . Diabetes mellitus without complication ([place])   . HLD (hyperlipidemia)   . Hypertension     Past Surgical History:  Procedure Laterality Date  . ABDOMINAL HYSTERECTOMY    . CESAREAN SECTION    . COLONOSCOPY      There were no vitals filed for this visit.   Subjective Assessment - 10/11/19 1334    Subjective Patient states she is a litted tired today. She did some house work this morning before coming to therapy.    Pertinent History Bilateral knee pain,    Limitations Standing;Walking    How long can you stand comfortably? can not stand up very long    How long can you walk comfortably? ocan not walk very far    Diagnostic tests Brain MRI: enlargement of the pituitary gland without mass effect on the optic nerve    Patient Stated Goals better balance, be able to walk further    Currently in Pain? No/denies    Pain Score 0-No pain    Pain Location Knee    Pain Orientation Left    Pain Descriptors / Indicators Aching    Pain Type Chronic pain    Pain Onset More than a month ago    Pain Frequency Constant    Aggravating Factors  standing and walking    Pain  Relieving Factors rest                             [facility] Adult PT Treatment/Exercise - 10/12/19 0001      Ambulation/Gait   Gait Comments ambulated 185 feet with single point cane and CGA      Neuro Re-ed    Neuro Re-ed Details  step onto air-ex 2x10 each leg; semi-tandem stance on air-ex 1x20 sec eyes open, 1x10 sec eyes closed       Knee/Hip Exercises: Aerobic   Nustep L4x106min      Knee/Hip Exercises: Standing   Hip Abduction 1 set;15 reps;Left;Right    Forward Step Up 1 set;10 reps;Right;Left;Step Height: 6"    Functional Squat 2 sets;10 reps      Knee/Hip Exercises: Supine   Bridges 3 sets;10 reps    Straight Leg Raises 1 set;15 reps;Right;Left    Other Supine Knee/Hip Exercises birddog hold 3x10 sec     Other Supine Knee/Hip Exercises Clam shells green band x20                  PT Education - 10/11/19 1556  Education Details HEP, balance education    Person(s) Educated Patient    Methods Demonstration;Tactile cues;Explanation;Verbal cues    Comprehension Verbal cues required;Tactile cues required;Returned demonstration;Verbalized understanding            PT Short Term Goals - 10/04/19 1605      PT SHORT TERM GOAL #1   Title Patient will increase gros bilateral LE strength to 4+/5    Baseline 5/5 gross     Time 3    Period Weeks    Status On-going    Target Date 09/06/19      PT SHORT TERM GOAL #2   Title Patient wil transfer sit to stand without the use of her hands    Baseline independnet with intial HEP     Time 3    Period Weeks    Target Date 09/06/19      PT SHORT TERM GOAL #3   Title Patient will ambualte 200' with LRAD without right knee hyper extension    Baseline worked on gait with cane    Time 3    Period Weeks    Status On-going    Target Date 09/06/19             PT Long Term Goals - 08/16/19 1551      PT LONG TERM GOAL #1   Title Patient will go up/down 8 steps without pain     Time 6    Period  Weeks    Status New    Target Date 09/27/19      PT LONG TERM GOAL #2   Title Patient will ambalte 2000' with LRAD without antalgic gait or decreased balance    Time 6    Period Weeks    Status New    Target Date 09/27/19      PT LONG TERM GOAL #3   Title Patient will increase BERG score to 45 to decrease falls    Time 6    Period Weeks    Status New    Target Date 09/27/19                 Plan - 10/11/19 1557    Clinical Impression Statement Patient continues to show progress with therapy. Pt was able to ambulate 185 feet with single point cane without taking any breaks. Progressed core strengthening, adding in dead bug hold, and pt tolerated exercise well. Therapy continues to work on hip, glute, and quad strengthening. Therapy worked on balance exercises stepping onto air-ex to work on maintaining balance on soft surfaces. Continue to progress strenghtening exercises and challenge balance to help decrease pt's falls risk.    Personal Factors and Comorbidities Comorbidity 1;Comorbidity 2    Comorbidities bilateral knee OA, DMII    Examination-Activity Limitations Sit;Transfers;Squat;Lift;Locomotion Level;Carry;Stand    Examination-Participation Restrictions Shop;Laundry;Cleaning;Driving    Stability/Clinical Decision Making Evolving/Moderate complexity    Clinical Decision Making Moderate    Rehab Potential Good    PT Frequency 2x / week    PT Duration 6 weeks    PT Treatment/Interventions ADLs/Self Care Home Management;Cryotherapy;Electrical Stimulation;Moist Heat;Functional mobility training;Therapeutic activities;Therapeutic exercise;Patient/family education;Balance training;Neuromuscular re-education;Manual techniques;Passive range of motion;Taping    PT Next Visit Plan review HEP; consider supine core strengthening and ther-ex with a progression to standing; startstanding balance; progress from floor -> air-ex as tolerated; ambulating with a cane; Re-assess patient     PT Home Exercise Plan Access Code: [email or web address].Seated Long Arc Quad (90-45 Degree Range) - 2 x daily -  7 x weekly - 3 sets - 10 reps.Seated Hip Abduction with Resistance - 2 x daily - 7 x weekly - 3 sets - 10 reps.Seated March - 2 x daily - 7 x weekly - 3 sets - 10 reps    Consulted and Agree with Plan of Care Patient           Patient will benefit from skilled therapeutic intervention in order to improve the following deficits and impairments:  Abnormal gait, Pain, Decreased activity tolerance, Decreased safety awareness, Decreased range of motion, Increased muscle spasms, Difficulty walking, Decreased endurance, Impaired perceived functional ability, Increased fascial restricitons, Decreased strength  Visit Diagnosis: Other abnormalities of gait and mobility  Difficulty in walking, not elsewhere classified  Muscle weakness (generalized)  Chronic pain of left knee  Chronic pain of right knee     Problem List Patient Active Problem List   Diagnosis Date Noted  . Hypercalcemia 07/20/2019  . Pituitary macroadenoma ([place]) 07/18/2019    Carney Living PT DPT  10/12/2019, 8:40 AM  [facility] [address] [place], [place], [address] Phone: [phone]   Fax:  [phone]  Name: Elaine Burke MRN: [id number] Date of Birth: [birth year]

## 2019-10-15 ENCOUNTER — Telehealth: Payer: Self-pay | Admitting: Endocrinology

## 2019-10-15 NOTE — Telephone Encounter (Signed)
please contact patient: Did pt go to see neurosurgery?

## 2019-10-15 NOTE — Telephone Encounter (Deleted)
-----   Message from Renato Shin, MD sent at 07/26/2019  7:25 PM EDT ----- F/u needed? Did pt see NS? Did pt draw PTH

## 2019-10-16 NOTE — Telephone Encounter (Signed)
Routing this message to referral coordinator follow up. Also sent a My Chart message to pt for further follow up. Below is the last entry made re: this pt's referral:  Referral Notes Number of Notes: 2 . Type Date User Summary Attachment  General 07/25/2019 12:40 PM Armen Pickup - -  Note   LM for [facility] TCB to give [place] an update        . Type Date User Summary Attachment  General 07/19/2019 8:49 AM Nile Riggs, CMA - -  Note   Faxed appropriate documentation to [facility]-call [phone] to check status

## 2019-10-16 NOTE — Telephone Encounter (Signed)
Were they faxing records for Dr. Loanne Drilling to review?

## 2019-10-16 NOTE — Telephone Encounter (Signed)
[facility] and spoke to [place] states patient was seen on 07/20/19

## 2019-10-17 ENCOUNTER — Ambulatory Visit: Payer: Medicare Other | Admitting: Physical Therapy

## 2019-10-19 ENCOUNTER — Encounter: Payer: Self-pay | Admitting: Endocrinology

## 2019-10-19 NOTE — Telephone Encounter (Signed)
LVM requesting returned call to inquire about appt as well as advise to have notes sent to our office. In addition, since pt had not seen My Chart message and has not completed her labs either, a letter has been mailed detailing all that is incomplete at this time.

## 2019-10-19 NOTE — Telephone Encounter (Signed)
I do not know that information-I send the referral and I don't know if a request to have records sent is needed

## 2019-10-19 NOTE — Telephone Encounter (Signed)
Pt returned call. Informed her that a letter has been mailed detailing all needs. However, since she has returned my call, pt was scheduled for lab appt on 10/30/19. Pt also advised to have office notes from [facility] forwarded to our office. Verbalized acceptance and understanding.

## 2019-10-27 ENCOUNTER — Ambulatory Visit: Payer: Medicare Other | Admitting: Physical Therapy

## 2019-10-30 ENCOUNTER — Telehealth: Payer: Self-pay

## 2019-10-30 ENCOUNTER — Other Ambulatory Visit: Payer: Self-pay

## 2019-10-30 ENCOUNTER — Other Ambulatory Visit (INDEPENDENT_AMBULATORY_CARE_PROVIDER_SITE_OTHER): Payer: Medicare Other

## 2019-10-30 NOTE — Telephone Encounter (Signed)
Yes, pt does need drawn.  Thank you.

## 2019-10-30 NOTE — Telephone Encounter (Signed)
Following was ordered 5/11/2 but not drawn. Appears orders did not print. Routing to Dr. Loanne Drilling to determine if he is still wanting these labs:   Alpha Subunit (Free)   Arginine vasopressin hormone   Insulin-like growth factor

## 2019-10-30 NOTE — Telephone Encounter (Signed)
I have corrected the orders at [facility] request to reflect Lab Collect so that they will print correctly.

## 2019-10-30 NOTE — Addendum Note (Signed)
Addended by: Hardie Pulley, Aemilia Dedrick J on: 10/30/2019 12:52 PM   Modules accepted: Orders

## 2019-10-31 LAB — PTH, INTACT AND CALCIUM
Calcium: 10.7 mg/dL — ABNORMAL HIGH (ref 8.6–10.4)
PTH: 44 pg/mL (ref 14–64)

## 2019-11-01 ENCOUNTER — Telehealth: Payer: Self-pay

## 2019-11-01 ENCOUNTER — Ambulatory Visit: Payer: Medicare Other | Admitting: Physical Therapy

## 2019-11-01 ENCOUNTER — Other Ambulatory Visit: Payer: Self-pay

## 2019-11-01 ENCOUNTER — Encounter: Payer: Self-pay | Admitting: Physical Therapy

## 2019-11-01 DIAGNOSIS — R262 Difficulty in walking, not elsewhere classified: Secondary | ICD-10-CM

## 2019-11-01 DIAGNOSIS — R2689 Other abnormalities of gait and mobility: Secondary | ICD-10-CM

## 2019-11-01 DIAGNOSIS — M6281 Muscle weakness (generalized): Secondary | ICD-10-CM

## 2019-11-01 DIAGNOSIS — G8929 Other chronic pain: Secondary | ICD-10-CM

## 2019-11-01 NOTE — Telephone Encounter (Signed)
Letter faxed to [facility] requesting office notes be faxed to our office as seen below:  [facility] [address] [place], [place]  [address] Phone:  [phone]   Fax:  [phone]   MRN: [id number] Elaine Burke [place] [address]   Date: 11/01/2019   Pt was referred to your office and we still have not received any office notes pertaining to her appointment with regards to the surgeons treatment plan. Please fax those records to our office ASAP at [phone].   Sincerely,  Renato Shin, MD  Also sent My Chart message to pt re: our needs as seen below:  [facility] [address] [place], [place]  [address] Phone:  [phone]   Fax:  [phone]   MRN: [id number] Elaine Burke [place] [address]   Date: 11/01/2019  Dear [name],  We spoke on August 8th and had advised that we are still in need of the office notes from [facility]. We still have not received any correspondence from them to date. We have attempted to call their office to attempt to obtain these records but have not been successful in our attempts. In order to provide you with the best level of care, it is imperative=ve that we receive these records. We ask that you please call their office to have those records forwarded to our office as soon as possible.    Sincerely,  Renato Shin, MD  [facility] [employer],  In addition to the letter we have sent to both you and [facility] regarding your office notes, you are also in need of a follow up appointment to further discuss the plan of treatment. Please call the office as soon as possible to schedule this appointment.  Thank you  This MyChart message has not been read.

## 2019-11-01 NOTE — Telephone Encounter (Signed)
-----   Message from Renato Shin, MD sent at 10/31/2019  4:54 PM EDT ----- 1: I still need report from NS 2. Needs f/u ov next available. ----- Message ----- From: Interface, [facility] Results In Sent: 10/31/2019   1:46 PM EDT To: Renato Shin, MD

## 2019-11-02 ENCOUNTER — Encounter: Payer: Self-pay | Admitting: Physical Therapy

## 2019-11-02 NOTE — Therapy (Signed)
[place], [place], [address] Phone: [phone]   Fax:  [phone]  Physical Therapy Treatment  Patient Details  Name: Elaine Burke MRN: [id number] Date of Birth: 1950-03-31 Referring Provider (PT): Dr Hinda Lenis    Encounter Date: 11/01/2019   PT End of Session - 11/01/19 1339    Visit Number 12    Number of Visits 22    Date for PT Re-Evaluation 11/21/19    Authorization Type UHC Medicare    PT Start Time 1333    PT Stop Time 1412    PT Time Calculation (min) 39 min    Activity Tolerance Patient tolerated treatment well    Behavior During Therapy [facility] for tasks assessed/performed           Past Medical History:  Diagnosis Date  . Arthritis   . Arthritis   . Diabetes mellitus without complication ([place])   . HLD (hyperlipidemia)   . Hypertension     Past Surgical History:  Procedure Laterality Date  . ABDOMINAL HYSTERECTOMY    . CESAREAN SECTION    . COLONOSCOPY      There were no vitals filed for this visit.   Subjective Assessment - 11/01/19 1337    Subjective Patient continues to have limited balance. She feels like the strength is improving but the balance has not changed,    Pertinent History Bilateral knee pain,    Limitations Standing;Walking    How long can you stand comfortably? can not stand up very long    How long can you walk comfortably? ocan not walk very far    Diagnostic tests Brain MRI: enlargement of the pituitary gland without mass effect on the optic nerve    Patient Stated Goals better balance, be able to walk further    Currently in Pain? No/denies                             [facility] Adult PT Treatment/Exercise - 11/02/19 0001      Ambulation/Gait   Gait Comments ambaultion with cane 185';. Continues to have lateral movement with gait. She had no loss of balance but did have decreased stability       High Level Balance    High Level Balance Comments tandem stance eyes open and eyes closed 2x30sec; narrow base eyes open and eyes closed 2x30 sec hold  hell rocking with balance catch 2x10 on air-ex 2x10       Knee/Hip Exercises: Aerobic   Nustep L4x29min      Knee/Hip Exercises: Standing   Heel Raises 20 reps    Hip Flexion Limitations slow march x20     Other Standing Knee Exercises standing march at counter x20 reps      Knee/Hip Exercises: Seated   Long Arc Quad 2 sets;10 reps    Other Seated Knee/Hip Exercises x15 2lb     Hamstring Curl 2 sets;10 reps    Hamstring Limitations hamstring curl x15 green                  PT Education - 11/02/19 1208    Education Details reviewed benefits of balance exercises.    Person(s) Educated Patient    Methods Explanation;Demonstration;Tactile cues;Verbal cues    Comprehension Verbalized understanding;Returned demonstration;Verbal cues required;Tactile cues required            PT Short Term Goals - 10/04/19 1605  PT SHORT TERM GOAL #1   Title Patient will increase gros bilateral LE strength to 4+/5    Baseline 5/5 gross     Time 3    Period Weeks    Status On-going    Target Date 09/06/19      PT SHORT TERM GOAL #2   Title Patient wil transfer sit to stand without the use of her hands    Baseline independnet with intial HEP     Time 3    Period Weeks    Target Date 09/06/19      PT SHORT TERM GOAL #3   Title Patient will ambualte 200' with LRAD without right knee hyper extension    Baseline worked on gait with cane    Time 3    Period Weeks    Status On-going    Target Date 09/06/19             PT Long Term Goals - 08/16/19 1551      PT LONG TERM GOAL #1   Title Patient will go up/down 8 steps without pain     Time 6    Period Weeks    Status New    Target Date 09/27/19      PT LONG TERM GOAL #2   Title Patient will ambalte 2000' with LRAD without antalgic gait or decreased balance    Time 6    Period Weeks    Status  New    Target Date 09/27/19      PT LONG TERM GOAL #3   Title Patient will increase BERG score to 45 to decrease falls    Time 6    Period Weeks    Status New    Target Date 09/27/19                 Plan - 11/01/19 1356    Clinical Impression Statement Patient continues to demonstrate instability with the cane. She was advised to be careful if she perfroms cane training at home. She tolerated strengthening well. She continues to have some difficulty with balance and coordination drills. Next visit we will review how to do her balance exercises on her own at home. Therapy will continue to progress as tolerated.    Personal Factors and Comorbidities Comorbidity 1;Comorbidity 2    Comorbidities bilateral knee OA, DMII    Examination-Activity Limitations Sit;Transfers;Squat;Lift;Locomotion Level;Carry;Stand    Examination-Participation Restrictions Shop;Laundry;Cleaning;Driving    Stability/Clinical Decision Making Evolving/Moderate complexity    Clinical Decision Making Moderate    Rehab Potential Good    PT Frequency 2x / week    PT Duration 6 weeks    PT Treatment/Interventions ADLs/Self Care Home Management;Cryotherapy;Electrical Stimulation;Moist Heat;Functional mobility training;Therapeutic activities;Therapeutic exercise;Patient/family education;Balance training;Neuromuscular re-education;Manual techniques;Passive range of motion;Taping    PT Next Visit Plan review HEP; consider supine core strengthening and ther-ex with a progression to standing; startstanding balance; progress from floor -> air-ex as tolerated; ambulating with a cane; Re-assess patient    PT Home Exercise Plan Access Code: [email or web address] Long Arc Quad (90-45 Degree Range) - 2 x daily - 7 x weekly - 3 sets - 10 reps.Seated Hip Abduction with Resistance - 2 x daily - 7 x weekly - 3 sets - 10 reps.Seated March - 2 x daily - 7 x weekly - 3 sets - 10 reps    Consulted and Agree with Plan of Care Patient            Patient will benefit from  skilled therapeutic intervention in order to improve the following deficits and impairments:  Abnormal gait, Pain, Decreased activity tolerance, Decreased safety awareness, Decreased range of motion, Increased muscle spasms, Difficulty walking, Decreased endurance, Impaired perceived functional ability, Increased fascial restricitons, Decreased strength  Visit Diagnosis: Other abnormalities of gait and mobility  Difficulty in walking, not elsewhere classified  Muscle weakness (generalized)  Chronic pain of left knee     Problem List Patient Active Problem List   Diagnosis Date Noted  . Hypercalcemia 07/20/2019  . Pituitary macroadenoma ([place]) 07/18/2019    Carney Living PT DPT  11/02/2019, 12:11 PM  [facility] [address] [place], [place], [address] Phone: [phone]   Fax:  [phone]  Name: Elaine Burke MRN: [id number] Date of Birth: Aug 16, 1950

## 2019-11-03 ENCOUNTER — Ambulatory Visit: Payer: Medicare Other | Admitting: Physical Therapy

## 2019-11-03 ENCOUNTER — Encounter: Payer: Self-pay | Admitting: Physical Therapy

## 2019-11-03 ENCOUNTER — Other Ambulatory Visit: Payer: Self-pay

## 2019-11-03 DIAGNOSIS — M6281 Muscle weakness (generalized): Secondary | ICD-10-CM

## 2019-11-03 DIAGNOSIS — M25562 Pain in left knee: Secondary | ICD-10-CM

## 2019-11-03 DIAGNOSIS — R2689 Other abnormalities of gait and mobility: Secondary | ICD-10-CM | POA: Diagnosis not present

## 2019-11-03 DIAGNOSIS — G8929 Other chronic pain: Secondary | ICD-10-CM

## 2019-11-03 DIAGNOSIS — R262 Difficulty in walking, not elsewhere classified: Secondary | ICD-10-CM

## 2019-11-03 LAB — INSULIN-LIKE GROWTH FACTOR
IGF-I, LC/MS: 86 ng/mL (ref 41–279)
Z-Score (Female): -0.6 SD (ref ?–2.0)

## 2019-11-03 NOTE — Therapy (Signed)
[place], [place], [address] Phone: [phone]   Fax:  [phone]  Physical Therapy Treatment  Patient Details  Name: Elaine Burke MRN: [id number] Date of Birth: 07/20/1950 Referring Provider (PT): Dr Hinda Lenis    Encounter Date: 11/03/2019   PT End of Session - 11/03/19 1103    Visit Number 13    Number of Visits 22    Date for PT Re-Evaluation 11/21/19    Authorization Type UHC Medicare    PT Start Time 1058    PT Stop Time 1140    PT Time Calculation (min) 42 min    Activity Tolerance Patient tolerated treatment well    Behavior During Therapy [facility] for tasks assessed/performed           Past Medical History:  Diagnosis Date  . Arthritis   . Arthritis   . Diabetes mellitus without complication ([place])   . HLD (hyperlipidemia)   . Hypertension     Past Surgical History:  Procedure Laterality Date  . ABDOMINAL HYSTERECTOMY    . CESAREAN SECTION    . COLONOSCOPY      There were no vitals filed for this visit.   Subjective Assessment - 11/03/19 1100    Subjective Patient reports everything is about the same.    Pertinent History Bilateral knee pain,    Limitations Standing;Walking    How long can you stand comfortably? can not stand up very long    How long can you walk comfortably? can not walk very far    Diagnostic tests Brain MRI: enlargement of the pituitary gland without mass effect on the optic nerve    Currently in Pain? No/denies                             [facility] Adult PT Treatment/Exercise - 11/03/19 0001      Ambulation/Gait   Gait Comments ambaultion with cane 200;. Continues to have lateral movement with gait. She had no loss of balance but did have decreased stability       Neuro Re-ed    Neuro Re-ed Details  reviewed how to do exercises at home for balance. reviewed hand palcementr and safety. perfromed tandem stance eo and ec 2x30  sec each leg narrow base 3x30 sec eyees closed. She was able to self correct her balance. Perfromed coordiated stepping and box steps with min UE suppport.       Knee/Hip Exercises: Aerobic   Nustep L4x73min      Knee/Hip Exercises: Standing   Heel Raises 20 reps    Hip Flexion Limitations slow march x20     Functional Squat Limitations x15      Knee/Hip Exercises: Supine   Bridges Limitations x20     Straight Leg Raises Limitations 2x10 bilateral     Other Supine Knee/Hip Exercises Clam shells green band x20                  PT Education - 11/03/19 1100    Education Details HEP and symptom mangement    Person(s) Educated Patient    Methods Explanation;Demonstration;Tactile cues;Verbal cues    Comprehension Verbalized understanding;Returned demonstration;Verbal cues required;Tactile cues required            PT Short Term Goals - 10/04/19 1605      PT SHORT TERM GOAL #1   Title Patient will increase gros bilateral LE strength to 4+/5  Baseline 5/5 gross     Time 3    Period Weeks    Status On-going    Target Date 09/06/19      PT SHORT TERM GOAL #2   Title Patient wil transfer sit to stand without the use of her hands    Baseline independnet with intial HEP     Time 3    Period Weeks    Target Date 09/06/19      PT SHORT TERM GOAL #3   Title Patient will ambualte 200' with LRAD without right knee hyper extension    Baseline worked on gait with cane    Time 3    Period Weeks    Status On-going    Target Date 09/06/19             PT Long Term Goals - 08/16/19 1551      PT LONG TERM GOAL #1   Title Patient will go up/down 8 steps without pain     Time 6    Period Weeks    Status New    Target Date 09/27/19      PT LONG TERM GOAL #2   Title Patient will ambalte 2000' with LRAD without antalgic gait or decreased balance    Time 6    Period Weeks    Status New    Target Date 09/27/19      PT LONG TERM GOAL #3   Title Patient will increase  BERG score to 45 to decrease falls    Time 6    Period Weeks    Status New    Target Date 09/27/19                 Plan - 11/03/19 1105    Clinical Impression Statement Patient tolerated treatment well. She continues to require gaurding for balance exercises. Her strength overall appears to be improving.    Personal Factors and Comorbidities Comorbidity 1;Comorbidity 2    Comorbidities bilateral knee OA, DMII    Examination-Activity Limitations Sit;Transfers;Squat;Lift;Locomotion Level;Carry;Stand    Examination-Participation Restrictions Shop;Laundry;Cleaning;Driving    Stability/Clinical Decision Making Evolving/Moderate complexity    Clinical Decision Making Moderate    PT Frequency 2x / week    PT Duration 6 weeks    PT Treatment/Interventions ADLs/Self Care Home Management;Cryotherapy;Electrical Stimulation;Moist Heat;Functional mobility training;Therapeutic activities;Therapeutic exercise;Patient/family education;Balance training;Neuromuscular re-education;Manual techniques;Passive range of motion;Taping    PT Next Visit Plan review HEP; consider supine core strengthening and ther-ex with a progression to standing; startstanding balance; progress from floor -> air-ex as tolerated; ambulating with a cane; Re-assess patient    PT Home Exercise Plan Access Code: [email or web address] Long Arc Quad (90-45 Degree Range) - 2 x daily - 7 x weekly - 3 sets - 10 reps.Seated Hip Abduction with Resistance - 2 x daily - 7 x weekly - 3 sets - 10 reps.Seated March - 2 x daily - 7 x weekly - 3 sets - 10 reps    Consulted and Agree with Plan of Care Patient           Patient will benefit from skilled therapeutic intervention in order to improve the following deficits and impairments:  Abnormal gait, Pain, Decreased activity tolerance, Decreased safety awareness, Decreased range of motion, Increased muscle spasms, Difficulty walking, Decreased endurance, Impaired perceived functional  ability, Increased fascial restricitons, Decreased strength  Visit Diagnosis: Other abnormalities of gait and mobility  Difficulty in walking, not elsewhere classified  Muscle weakness (generalized)  Chronic pain of  left knee  Chronic pain of right knee     Problem List Patient Active Problem List   Diagnosis Date Noted  . Hypercalcemia 07/20/2019  . Pituitary macroadenoma ([place]) 07/18/2019    Carney Living PT DPT  11/03/2019, 11:48 AM  [facility] [address] [place], [place], [address] Phone: [phone]   Fax:  [phone]  Name: Elaine Burke MRN: [id number] Date of Birth: 09/25/1950

## 2019-11-06 ENCOUNTER — Ambulatory Visit: Payer: Medicare Other | Admitting: Physical Therapy

## 2019-11-06 ENCOUNTER — Telehealth: Payer: Self-pay

## 2019-11-06 ENCOUNTER — Other Ambulatory Visit: Payer: Self-pay

## 2019-11-06 ENCOUNTER — Encounter: Payer: Self-pay | Admitting: Physical Therapy

## 2019-11-06 DIAGNOSIS — M25562 Pain in left knee: Secondary | ICD-10-CM

## 2019-11-06 DIAGNOSIS — G8929 Other chronic pain: Secondary | ICD-10-CM

## 2019-11-06 DIAGNOSIS — R2689 Other abnormalities of gait and mobility: Secondary | ICD-10-CM | POA: Diagnosis not present

## 2019-11-06 DIAGNOSIS — M6281 Muscle weakness (generalized): Secondary | ICD-10-CM

## 2019-11-06 DIAGNOSIS — R262 Difficulty in walking, not elsewhere classified: Secondary | ICD-10-CM

## 2019-11-06 NOTE — Telephone Encounter (Signed)
-----   Message from Renato Shin, MD sent at 11/04/2019  9:18 AM EDT ----- please contact patient: F/u is due

## 2019-11-06 NOTE — Telephone Encounter (Signed)
My Chart message sent to pt advising to schedule appt to further discuss results.

## 2019-11-06 NOTE — Therapy (Signed)
[place], [place], [address] Phone: [phone]   Fax:  [phone]  Physical Therapy Treatment  Patient Details  Name: Elaine Burke MRN: [id number] Date of Birth: 1950/04/12 Referring Provider (PT): Dr Hinda Lenis    Encounter Date: 11/06/2019   PT End of Session - 11/06/19 1434    Visit Number 14    Number of Visits 22    Date for PT Re-Evaluation 11/21/19    Authorization Type UHC Medicare    PT Start Time 3149    PT Stop Time 1459    PT Time Calculation (min) 44 min    Activity Tolerance Patient tolerated treatment well    Behavior During Therapy [facility] for tasks assessed/performed           Past Medical History:  Diagnosis Date  . Arthritis   . Arthritis   . Diabetes mellitus without complication ([place])   . HLD (hyperlipidemia)   . Hypertension     Past Surgical History:  Procedure Laterality Date  . ABDOMINAL HYSTERECTOMY    . CESAREAN SECTION    . COLONOSCOPY      There were no vitals filed for this visit.   Subjective Assessment - 11/06/19 1431    Subjective Patient has no complaints. she has been working on her exercises at home    Pertinent History Bilateral knee pain,    Limitations Standing;Walking    How long can you stand comfortably? can not stand up very long    How long can you walk comfortably? can not walk very far    Diagnostic tests Brain MRI: enlargement of the pituitary gland without mass effect on the optic nerve    Patient Stated Goals better balance, be able to walk further    Currently in Pain? No/denies                             [facility] Adult PT Treatment/Exercise - 11/06/19 0001      Neuro Re-ed    Neuro Re-ed Details  nsarrow base on air-ex 3x30 sec hold EO and EC; step onto air-ex 10 forward and 10 side to side; standing on air-ex marble move; standing on air-ex reaching;       Knee/Hip Exercises: Aerobic   Nustep  L4x15min      Knee/Hip Exercises: Standing   Heel Raises 20 reps    Hip Flexion Limitations slow march x20     Functional Squat Limitations x15    Other Standing Knee Exercises hip abduction 2x10 2lb; hip extension 2x10 2lbs       Knee/Hip Exercises: Supine   Bridges Limitations x20     Straight Leg Raises Limitations 2x10 bilateral     Other Supine Knee/Hip Exercises Clam shells green band x20                  PT Education - 11/06/19 1432    Education Details reviewed balance activity    Person(s) Educated Patient    Methods Explanation;Demonstration;Verbal cues;Tactile cues    Comprehension Verbalized understanding;Returned demonstration;Verbal cues required;Tactile cues required            PT Short Term Goals - 10/04/19 1605      PT SHORT TERM GOAL #1   Title Patient will increase gros bilateral LE strength to 4+/5    Baseline 5/5 gross     Time 3    Period Weeks  Status On-going    Target Date 09/06/19      PT SHORT TERM GOAL #2   Title Patient wil transfer sit to stand without the use of her hands    Baseline independnet with intial HEP     Time 3    Period Weeks    Target Date 09/06/19      PT SHORT TERM GOAL #3   Title Patient will ambualte 200' with LRAD without right knee hyper extension    Baseline worked on gait with cane    Time 3    Period Weeks    Status On-going    Target Date 09/06/19             PT Long Term Goals - 08/16/19 1551      PT LONG TERM GOAL #1   Title Patient will go up/down 8 steps without pain     Time 6    Period Weeks    Status New    Target Date 09/27/19      PT LONG TERM GOAL #2   Title Patient will ambalte 2000' with LRAD without antalgic gait or decreased balance    Time 6    Period Weeks    Status New    Target Date 09/27/19      PT LONG TERM GOAL #3   Title Patient will increase BERG score to 45 to decrease falls    Time 6    Period Weeks    Status New    Target Date 09/27/19                  Plan - 11/06/19 1454    Clinical Impression Statement Therapy advanced fucntional balance activity. She perfromed rechaning on the air-ex and the marble transfer on the air-ex.    Personal Factors and Comorbidities Comorbidity 1;Comorbidity 2    Comorbidities bilateral knee OA, DMII    Examination-Activity Limitations Sit;Transfers;Squat;Lift;Locomotion Level;Carry;Stand    Examination-Participation Restrictions Shop;Laundry;Cleaning;Driving    Stability/Clinical Decision Making Evolving/Moderate complexity    Clinical Decision Making Moderate    Rehab Potential Good    PT Frequency 2x / week    PT Duration 6 weeks    PT Treatment/Interventions ADLs/Self Care Home Management;Cryotherapy;Electrical Stimulation;Moist Heat;Functional mobility training;Therapeutic activities;Therapeutic exercise;Patient/family education;Balance training;Neuromuscular re-education;Manual techniques;Passive range of motion;Taping    PT Next Visit Plan review HEP; consider supine core strengthening and ther-ex with a progression to standing; startstanding balance; progress from floor -> air-ex as tolerated; ambulating with a cane; Re-assess patient    PT Home Exercise Plan Access Code: [email or web address] Long Arc Quad (90-45 Degree Range) - 2 x daily - 7 x weekly - 3 sets - 10 reps.Seated Hip Abduction with Resistance - 2 x daily - 7 x weekly - 3 sets - 10 reps.Seated March - 2 x daily - 7 x weekly - 3 sets - 10 reps    Consulted and Agree with Plan of Care Patient           Patient will benefit from skilled therapeutic intervention in order to improve the following deficits and impairments:  Abnormal gait, Pain, Decreased activity tolerance, Decreased safety awareness, Decreased range of motion, Increased muscle spasms, Difficulty walking, Decreased endurance, Impaired perceived functional ability, Increased fascial restricitons, Decreased strength  Visit Diagnosis: Other abnormalities of gait  and mobility  Difficulty in walking, not elsewhere classified  Muscle weakness (generalized)  Chronic pain of left knee  Chronic pain of right knee  Problem List Patient Active Problem List   Diagnosis Date Noted  . Hypercalcemia 07/20/2019  . Pituitary macroadenoma ([place]) 07/18/2019    Carney Living PT DPT  11/06/2019, 5:03 PM   [facility] [address] [place], [place], [address] Phone: [phone]   Fax:  [phone]  Name: Elaine Burke MRN: [id number] Date of Birth: 1950-05-28

## 2019-11-08 ENCOUNTER — Encounter: Payer: Self-pay | Admitting: Physical Therapy

## 2019-11-08 ENCOUNTER — Ambulatory Visit (INDEPENDENT_AMBULATORY_CARE_PROVIDER_SITE_OTHER): Payer: Medicare Other | Admitting: Endocrinology

## 2019-11-08 ENCOUNTER — Other Ambulatory Visit: Payer: Self-pay

## 2019-11-08 ENCOUNTER — Ambulatory Visit: Payer: Medicare Other | Attending: Family Medicine | Admitting: Physical Therapy

## 2019-11-08 ENCOUNTER — Encounter: Payer: Self-pay | Admitting: Endocrinology

## 2019-11-08 VITALS — BP 124/88 | HR 95 | Ht 63.0 in | Wt 190.8 lb

## 2019-11-08 DIAGNOSIS — M25561 Pain in right knee: Secondary | ICD-10-CM | POA: Insufficient documentation

## 2019-11-08 DIAGNOSIS — M25562 Pain in left knee: Secondary | ICD-10-CM | POA: Diagnosis present

## 2019-11-08 DIAGNOSIS — M6281 Muscle weakness (generalized): Secondary | ICD-10-CM | POA: Diagnosis present

## 2019-11-08 DIAGNOSIS — D352 Benign neoplasm of pituitary gland: Secondary | ICD-10-CM

## 2019-11-08 DIAGNOSIS — R2689 Other abnormalities of gait and mobility: Secondary | ICD-10-CM

## 2019-11-08 DIAGNOSIS — G8929 Other chronic pain: Secondary | ICD-10-CM | POA: Diagnosis present

## 2019-11-08 DIAGNOSIS — R262 Difficulty in walking, not elsewhere classified: Secondary | ICD-10-CM | POA: Insufficient documentation

## 2019-11-08 LAB — BASIC METABOLIC PANEL
BUN: 13 mg/dL (ref 6–23)
CO2: 30 mEq/L (ref 19–32)
Calcium: 10.7 mg/dL — ABNORMAL HIGH (ref 8.4–10.5)
Chloride: 101 mEq/L (ref 96–112)
Creatinine, Ser: 0.95 mg/dL (ref 0.40–1.20)
GFR: 70.8 mL/min (ref >60.00)
Glucose, Bld: 138 mg/dL — ABNORMAL HIGH (ref 70–99)
Potassium: 3.5 mEq/L (ref 3.5–5.1)
Sodium: 139 mEq/L (ref 135–145)

## 2019-11-08 LAB — CORTISOL: Cortisol, Plasma: 10.8 ug/dL

## 2019-11-08 LAB — TSH: TSH: 2.2 u[IU]/mL (ref 0.35–4.50)

## 2019-11-08 LAB — T4, FREE: Free T4: 0.67 ng/dL (ref 0.60–1.60)

## 2019-11-08 NOTE — Progress Notes (Signed)
Subjective:    Patient ID: Elaine Burke, female    DOB: Feb 19, 1951, 69 y.o.   MRN: [id number]  HPI Pt returns for f/u of pituitary adenoma (dx'ed 2018; MRI showed diffusely enlarged and heterogeneously enhancing pituitary gland favored to reflect a 1.5 cm macroadenoma. Mild suprasellar extension without mass effect on the optic apparatus; main symptom is unsteady gait; NS advised f/u 1 year). She has these pit functions: TSH: normal FSH/LH: borderline menopausal.  Prolactin: borderline high ACTH: normal (and ON dext test is normal) VP: USG is pending GH: normal ASUB: normal  Interval hx: Gait difficulty is improved with PT.  She saw opthal, who plans to repeat VF 10/21.   Past Medical History:  Diagnosis Date  . Arthritis   . Arthritis   . Diabetes mellitus without complication ([place])   . HLD (hyperlipidemia)   . Hypertension     Past Surgical History:  Procedure Laterality Date  . ABDOMINAL HYSTERECTOMY    . CESAREAN SECTION    . COLONOSCOPY      Social History   Socioeconomic History  . Marital status: Married    Spouse name: Not on file  . Number of children: Not on file  . Years of education: Not on file  . Highest education level: Not on file  Occupational History  . Not on file  Tobacco Use  . Smoking status: Never Smoker  . Smokeless tobacco: Never Used  Substance and Sexual Activity  . Alcohol use: No  . Drug use: No  . Sexual activity: Not on file  Other Topics Concern  . Not on file  Social History Narrative  . Not on file   Social Determinants of Health   Financial Resource Strain:   . Difficulty of Paying Living Expenses: Not on file  Food Insecurity:   . Worried About Charity fundraiser in the Last Year: Not on file  . Ran Out of Food in the Last Year: Not on file  Transportation Needs:   . Lack of Transportation (Medical): Not on file  . Lack of Transportation (Non-Medical): Not on file  Physical Activity:   . Days of  Exercise per Week: Not on file  . Minutes of Exercise per Session: Not on file  Stress:   . Feeling of Stress : Not on file  Social Connections:   . Frequency of Communication with Friends and Family: Not on file  . Frequency of Social Gatherings with Friends and Family: Not on file  . Attends Religious Services: Not on file  . Active Member of Clubs or Organizations: Not on file  . Attends Archivist Meetings: Not on file  . Marital Status: Not on file  Intimate Partner Violence:   . Fear of Current or Ex-Partner: Not on file  . Emotionally Abused: Not on file  . Physically Abused: Not on file  . Sexually Abused: Not on file    Current Outpatient Medications on File Prior to Visit  Medication Sig Dispense Refill  . amLODipine (NORVASC) 10 MG tablet Take 1 tablet by mouth daily.  5  . CALCIUM PO Take by mouth.    . loratadine (CLARITIN) 10 MG tablet Take 10 mg by mouth daily.    . potassium chloride (KLOR-CON) 10 MEQ tablet Take 10 mEq by mouth daily.    . rosuvastatin (CRESTOR) 5 MG tablet Take 5 mg by mouth daily.    . valsartan-hydrochlorothiazide (DIOVAN-HCT) 320-25 MG tablet Take 1 tablet by mouth daily.  No current facility-administered medications on file prior to visit.     Family History  Problem Relation Age of Onset  . Breast cancer Paternal Aunt   . Other Neg Hx        pituitary abnormality    BP 124/88   Pulse 95   Ht 5\' 3"  (1.6 m)   Wt 190 lb 12.8 oz (86.5 kg)   SpO2 99%   BMI 33.80 kg/m   Review of Systems Denies headache.      Objective:   Physical Exam VITAL SIGNS:  See vs page GENERAL: no distress GAIT: steady, with a walker.    Prolactin=25     Assessment & Plan:  Pituitary adenoma, nonsecretory Gait difficulty: we'll hold off on parlodel Hypercalcemia: check PTH.   Patient Instructions  Blood and urine tests are requested for you today.  We'll let you know about the results.   Please continue the same medication for  blood pressure.  Please come back for a follow-up appointment in 6 months.

## 2019-11-08 NOTE — Patient Instructions (Addendum)
Blood and urine tests are requested for you today.  We'll let you know about the results.   Please continue the same medication for blood pressure.  Please come back for a follow-up appointment in 6 months.

## 2019-11-09 ENCOUNTER — Other Ambulatory Visit: Payer: Self-pay

## 2019-11-09 ENCOUNTER — Encounter: Payer: Self-pay | Admitting: Physical Therapy

## 2019-11-09 LAB — ARGININE VASOPRESSIN HORMONE
ADH: <0.8 pg/mL (ref 0.0–4.7)
Osmolality Meas: 289 mOsmol/kg (ref 280–301)

## 2019-11-09 LAB — ALPHA SUBUNIT (FREE): Alpha Subunit (Free): 0.9 ng/mL

## 2019-11-09 NOTE — Therapy (Signed)
[place], [place], [address] Phone: [phone]   Fax:  [phone]  Physical Therapy Treatment  Patient Details  Name: Elaine Burke MRN: [id number] Date of Birth: May 21, 1950 Referring Provider (PT): Dr Hinda Lenis    Encounter Date: 11/08/2019   PT End of Session - 11/08/19 1420    Visit Number 15    Number of Visits 22    Date for PT Re-Evaluation 11/21/19    Authorization Type UHC Medicare    PT Start Time 1324    PT Stop Time 1458    PT Time Calculation (min) 43 min    Activity Tolerance Patient tolerated treatment well    Behavior During Therapy [facility] for tasks assessed/performed           Past Medical History:  Diagnosis Date  . Arthritis   . Arthritis   . Diabetes mellitus without complication ([place])   . HLD (hyperlipidemia)   . Hypertension     Past Surgical History:  Procedure Laterality Date  . ABDOMINAL HYSTERECTOMY    . CESAREAN SECTION    . COLONOSCOPY      There were no vitals filed for this visit.   Subjective Assessment - 11/08/19 1418    Subjective Patient has no complaints today. She had no incrase in pain last time depsite increased intensity of exercises.    Pertinent History Bilateral knee pain,    Limitations Standing;Walking    How long can you stand comfortably? can not stand up very long    How long can you walk comfortably? can not walk very far    Diagnostic tests Brain MRI: enlargement of the pituitary gland without mass effect on the optic nerve    Patient Stated Goals better balance, be able to walk further    Currently in Pain? No/denies                             [facility] Adult PT Treatment/Exercise - 11/09/19 0001      Neuro Re-ed    Neuro Re-ed Details  nsarrow base on air-ex 3x30 sec hold EO and EC; step onto air-ex 10 forward and 10 side to side; standing on air-ex marble move; standing on air-ex reaching;        Knee/Hip Exercises: Aerobic   Nustep L4x53min      Knee/Hip Exercises: Standing   Heel Raises 20 reps    Hip Flexion Limitations slow march x20     Functional Squat Limitations x15    Other Standing Knee Exercises sit to stand     Other Standing Knee Exercises kettle bell swing 2x15; keettle bell lift 2x10       Knee/Hip Exercises: Supine   Bridges Limitations x20     Straight Leg Raises Limitations 2x10 bilateral     Other Supine Knee/Hip Exercises supine march 1lb weight 2x10 ; double knee to ches with ball 2x10     Other Supine Knee/Hip Exercises Clam shells green band x20                  PT Education - 11/08/19 1419    Education Details HEP and symptom mangement    Person(s) Educated Patient    Methods Explanation;Tactile cues;Demonstration;Verbal cues    Comprehension Verbalized understanding;Tactile cues required;Verbal cues required;Returned demonstration            PT Short Term Goals - 10/04/19 1605  PT SHORT TERM GOAL #1   Title Patient will increase gros bilateral LE strength to 4+/5    Baseline 5/5 gross     Time 3    Period Weeks    Status On-going    Target Date 09/06/19      PT SHORT TERM GOAL #2   Title Patient wil transfer sit to stand without the use of her hands    Baseline independnet with intial HEP     Time 3    Period Weeks    Target Date 09/06/19      PT SHORT TERM GOAL #3   Title Patient will ambualte 200' with LRAD without right knee hyper extension    Baseline worked on gait with cane    Time 3    Period Weeks    Status On-going    Target Date 09/06/19             PT Long Term Goals - 08/16/19 1551      PT LONG TERM GOAL #1   Title Patient will go up/down 8 steps without pain     Time 6    Period Weeks    Status New    Target Date 09/27/19      PT LONG TERM GOAL #2   Title Patient will ambalte 2000' with LRAD without antalgic gait or decreased balance    Time 6    Period Weeks    Status New    Target Date  09/27/19      PT LONG TERM GOAL #3   Title Patient will increase BERG score to 45 to decrease falls    Time 6    Period Weeks    Status New    Target Date 09/27/19                 Plan - 11/08/19 1421    Clinical Impression Statement Patientwas able to lift keelte bell today> She reported it was challenging but she was able to do it. She was also able to do keettle bell swings. She did better with tandem stance on air-ex today. Therapy will continue to advance exercises as tolerated.    Comorbidities bilateral knee OA, DMII    Examination-Activity Limitations Sit;Transfers;Squat;Lift;Locomotion Level;Carry;Stand    Examination-Participation Restrictions Shop;Laundry;Cleaning;Driving    Stability/Clinical Decision Making Evolving/Moderate complexity    Clinical Decision Making Moderate    Rehab Potential Good    PT Frequency 2x / week    PT Duration 6 weeks    PT Treatment/Interventions ADLs/Self Care Home Management;Cryotherapy;Electrical Stimulation;Moist Heat;Functional mobility training;Therapeutic activities;Therapeutic exercise;Patient/family education;Balance training;Neuromuscular re-education;Manual techniques;Passive range of motion;Taping    PT Next Visit Plan review HEP; consider supine core strengthening and ther-ex with a progression to standing; startstanding balance; progress from floor -> air-ex as tolerated; ambulating with a cane; Re-assess patient    PT Home Exercise Plan Access Code: [email or web address] Long Arc Quad (90-45 Degree Range) - 2 x daily - 7 x weekly - 3 sets - 10 reps.Seated Hip Abduction with Resistance - 2 x daily - 7 x weekly - 3 sets - 10 reps.Seated March - 2 x daily - 7 x weekly - 3 sets - 10 reps    Consulted and Agree with Plan of Care Patient           Patient will benefit from skilled therapeutic intervention in order to improve the following deficits and impairments:  Abnormal gait, Pain, Decreased activity tolerance, Decreased  safety awareness, Decreased range  of motion, Increased muscle spasms, Difficulty walking, Decreased endurance, Impaired perceived functional ability, Increased fascial restricitons, Decreased strength  Visit Diagnosis: Other abnormalities of gait and mobility  Difficulty in walking, not elsewhere classified  Muscle weakness (generalized)  Chronic pain of left knee     Problem List Patient Active Problem List   Diagnosis Date Noted  . Hypercalcemia 07/20/2019  . Pituitary macroadenoma ([place]) 07/18/2019    Carney Living PT DPT  11/09/2019, 12:47 PM  [facility] [address] [place], [place], [address] Phone: [phone]   Fax:  [phone]  Name: Erendida Wrenn MRN: [id number] Date of Birth: [birth year]

## 2019-11-10 ENCOUNTER — Telehealth: Payer: Self-pay

## 2019-11-10 NOTE — Telephone Encounter (Signed)
LVM advising pt to call to schedule appt for labs

## 2019-11-10 NOTE — Telephone Encounter (Signed)
-----   Message from Elaine Shin, MD sent at 11/09/2019  3:09 PM EDT ----- please contact patient: Please come back to the lab, to give a urine specimen.  Also, because your calcium was high again. I needed to add another blood test.  Thank you ----- Message ----- From: Elaine Burke I Sent: 11/09/2019   7:41 AM EDT To: Elaine Shin, MD  Sorry, no tubes to add this test to.  She could not leave a urine specimen. ----- Message ----- From: Elaine Shin, MD Sent: 11/08/2019   7:13 PM EDT To: Elaine Burke  I added PTH.  Can lab do with blood they have?  Thank you

## 2019-11-15 ENCOUNTER — Other Ambulatory Visit: Payer: Self-pay

## 2019-11-15 ENCOUNTER — Other Ambulatory Visit (INDEPENDENT_AMBULATORY_CARE_PROVIDER_SITE_OTHER): Payer: Medicare Other

## 2019-11-15 ENCOUNTER — Encounter: Payer: Self-pay | Admitting: Physical Therapy

## 2019-11-15 ENCOUNTER — Ambulatory Visit: Payer: Medicare Other | Admitting: Physical Therapy

## 2019-11-15 DIAGNOSIS — R262 Difficulty in walking, not elsewhere classified: Secondary | ICD-10-CM

## 2019-11-15 DIAGNOSIS — M6281 Muscle weakness (generalized): Secondary | ICD-10-CM

## 2019-11-15 DIAGNOSIS — R2689 Other abnormalities of gait and mobility: Secondary | ICD-10-CM

## 2019-11-15 DIAGNOSIS — M25561 Pain in right knee: Secondary | ICD-10-CM

## 2019-11-15 DIAGNOSIS — G8929 Other chronic pain: Secondary | ICD-10-CM

## 2019-11-15 DIAGNOSIS — M25562 Pain in left knee: Secondary | ICD-10-CM

## 2019-11-16 LAB — PTH, INTACT AND CALCIUM
Calcium: 11.1 mg/dL — ABNORMAL HIGH (ref 8.6–10.4)
PTH: 62 pg/mL (ref 14–64)

## 2019-11-16 NOTE — Therapy (Signed)
[place], [place], [address] Phone: [phone]   Fax:  [phone]  Physical Therapy Treatment  Patient Details  Name: Lauralye Kinn MRN: [id number] Date of Birth: 02/23/1951 Referring Provider (PT): Dr Hinda Lenis    Encounter Date: 11/15/2019   PT End of Session - 11/15/19 1337    Visit Number 16    Number of Visits 22    Date for PT Re-Evaluation 11/21/19    Authorization Type UHC Medicare    PT Start Time 1330    PT Stop Time 1412    PT Time Calculation (min) 42 min    Equipment Utilized During Treatment Gait belt    Activity Tolerance Patient tolerated treatment well    Behavior During Therapy [facility] for tasks assessed/performed           Past Medical History:  Diagnosis Date  . Arthritis   . Arthritis   . Diabetes mellitus without complication ([place])   . HLD (hyperlipidemia)   . Hypertension     Past Surgical History:  Procedure Laterality Date  . ABDOMINAL HYSTERECTOMY    . CESAREAN SECTION    . COLONOSCOPY      There were no vitals filed for this visit.   Subjective Assessment - 11/15/19 1336    Subjective Patient reports no real change. She is working on her exercises.    Pertinent History Bilateral knee pain,    Limitations Standing;Walking    How long can you stand comfortably? can not stand up very long    How long can you walk comfortably? can not walk very far    Diagnostic tests Brain MRI: enlargement of the pituitary gland without mass effect on the optic nerve    Patient Stated Goals better balance, be able to walk further    Currently in Pain? No/denies                             [facility] Adult PT Treatment/Exercise - 11/16/19 0001      Ambulation/Gait   Gait Comments decreased control of left leg, slow deliberate steps, speed decreased with distance, speed and coordiantion also decreased with distraction.       Neuro Re-ed    Neuro  Re-ed Details  tandem stance on air-ex 3x 1 min hold eyes closed;  Hurdle steps side to side x20; standing step overs 2x10;       Knee/Hip Exercises: Standing   Heel Raises 20 reps    Hip Flexion Limitations slow march x20     Forward Lunges Limitations with 4lb weights     Functional Squat Limitations x15    Other Standing Knee Exercises sit to stand     Other Standing Knee Exercises standing hip abduction 4lb 2x10; stending hip extension 2x10;       Knee/Hip Exercises: Supine   Bridges Limitations x20     Straight Leg Raises Limitations 2x10 bilateral     Other Supine Knee/Hip Exercises Clam shells green band x20                  PT Education - 11/15/19 1336    Education Details reviewed balance exercises at home    Person(s) Educated Patient    Methods Explanation;Demonstration;Tactile cues;Verbal cues    Comprehension Verbalized understanding;Returned demonstration;Verbal cues required;Tactile cues required            PT Short Term Goals - 10/04/19  [place] #1   Title Patient will increase gros bilateral LE strength to 4+/5    Baseline 5/5 gross     Time 3    Period Weeks    Status On-going    Target Date 09/06/19      PT SHORT TERM GOAL #2   Title Patient wil transfer sit to stand without the use of her hands    Baseline independnet with intial HEP     Time 3    Period Weeks    Target Date 09/06/19      PT SHORT TERM GOAL #3   Title Patient will ambualte 200' with LRAD without right knee hyper extension    Baseline worked on gait with cane    Time 3    Period Weeks    Status On-going    Target Date 09/06/19             PT Long Term Goals - 08/16/19 1551      PT LONG TERM GOAL #1   Title Patient will go up/down 8 steps without pain     Time 6    Period Weeks    Status New    Target Date 09/27/19      PT LONG TERM GOAL #2   Title Patient will ambalte 2000' with LRAD without antalgic gait or decreased balance    Time 6     Period Weeks    Status New    Target Date 09/27/19      PT LONG TERM GOAL #3   Title Patient will increase BERG score to 45 to decrease falls    Time 6    Period Weeks    Status New    Target Date 09/27/19                 Plan - 11/16/19 0816    Clinical Impression Statement Therapy continues to advance her weights and diffculty of exercises. She still continues to lack control of her left leg with ambualation. She feels like she is stronger but he coordination is still off. We continue to work on coordination exercises.    Personal Factors and Comorbidities Comorbidity 1;Comorbidity 2    Comorbidities bilateral knee OA, DMII    Examination-Activity Limitations Sit;Transfers;Squat;Lift;Locomotion Level;Carry;Stand    Examination-Participation Restrictions Shop;Laundry;Cleaning;Driving    Stability/Clinical Decision Making Evolving/Moderate complexity    Clinical Decision Making Moderate    Rehab Potential Good    PT Frequency 2x / week    PT Duration 6 weeks    PT Treatment/Interventions ADLs/Self Care Home Management;Cryotherapy;Electrical Stimulation;Moist Heat;Functional mobility training;Therapeutic activities;Therapeutic exercise;Patient/family education;Balance training;Neuromuscular re-education;Manual techniques;Passive range of motion;Taping    PT Next Visit Plan review HEP; consider supine core strengthening and ther-ex with a progression to standing; startstanding balance; progress from floor -> air-ex as tolerated; ambulating with a cane; Re-assess patient    PT Home Exercise Plan Access Code: [email or web address].Seated Long Arc Quad (90-45 Degree Range) - 2 x daily - 7 x weekly - 3 sets - 10 reps.Seated Hip Abduction with Resistance - 2 x daily - 7 x weekly - 3 sets - 10 reps.Seated March - 2 x daily - 7 x weekly - 3 sets - 10 reps    Consulted and Agree with Plan of Care Patient           Patient will benefit from skilled therapeutic intervention in order to  improve the following deficits and impairments:  Abnormal gait, Pain, Decreased activity tolerance, Decreased safety awareness, Decreased range of motion, Increased muscle spasms, Difficulty walking, Decreased endurance, Impaired perceived functional ability, Increased fascial restricitons, Decreased strength  Visit Diagnosis: Other abnormalities of gait and mobility  Difficulty in walking, not elsewhere classified  Muscle weakness (generalized)  Chronic pain of left knee  Chronic pain of right knee     Problem List Patient Active Problem List   Diagnosis Date Noted  . Hypercalcemia 07/20/2019  . Pituitary macroadenoma ([place]) 07/18/2019    Carney Living PT DPT  11/16/2019, 9:19 AM  [facility] [address] [place], [place], [address] Phone: [phone]   Fax:  [phone]  Name: Puanani Gene MRN: [id number] Date of Birth: [birth year]

## 2019-11-17 ENCOUNTER — Ambulatory Visit: Payer: Medicare Other | Admitting: Physical Therapy

## 2019-11-20 ENCOUNTER — Encounter: Payer: Self-pay | Admitting: Physical Therapy

## 2019-11-20 ENCOUNTER — Ambulatory Visit: Payer: Medicare Other | Admitting: Physical Therapy

## 2019-11-20 ENCOUNTER — Other Ambulatory Visit: Payer: Self-pay

## 2019-11-20 DIAGNOSIS — M6281 Muscle weakness (generalized): Secondary | ICD-10-CM

## 2019-11-20 DIAGNOSIS — G8929 Other chronic pain: Secondary | ICD-10-CM

## 2019-11-20 DIAGNOSIS — M25561 Pain in right knee: Secondary | ICD-10-CM

## 2019-11-20 DIAGNOSIS — R262 Difficulty in walking, not elsewhere classified: Secondary | ICD-10-CM

## 2019-11-20 DIAGNOSIS — R2689 Other abnormalities of gait and mobility: Secondary | ICD-10-CM | POA: Diagnosis not present

## 2019-11-21 NOTE — Therapy (Signed)
[place], [place], [address] Phone: [phone]   Fax:  [phone]  Physical Therapy Treatment  Patient Details  Name: Elaine Burke MRN: [id number] Date of Birth: 03/24/1950 Referring Provider (PT): Dr Hinda Lenis    Encounter Date: 11/20/2019   PT End of Session - 11/20/19 1336    Visit Number 17    Number of Visits 22    Date for PT Re-Evaluation 11/21/19    Authorization Type UHC Medicare    PT Start Time 1330    PT Stop Time 1413    PT Time Calculation (min) 43 min    Activity Tolerance Patient tolerated treatment well    Behavior During Therapy [facility] for tasks assessed/performed           Past Medical History:  Diagnosis Date  . Arthritis   . Arthritis   . Diabetes mellitus without complication ([place])   . HLD (hyperlipidemia)   . Hypertension     Past Surgical History:  Procedure Laterality Date  . ABDOMINAL HYSTERECTOMY    . CESAREAN SECTION    . COLONOSCOPY      There were no vitals filed for this visit.   Subjective Assessment - 11/20/19 1335    Subjective Patient report no difficulty over the weekend.    Pertinent History Bilateral knee pain,    Limitations Standing;Walking    How long can you stand comfortably? can not stand up very long    How long can you walk comfortably? can not walk very far    Diagnostic tests Brain MRI: enlargement of the pituitary gland without mass effect on the optic nerve    Currently in Pain? No/denies                             [facility] Adult PT Treatment/Exercise - 11/21/19 0001      Ambulation/Gait   Gait Comments improved control today. Still fatigued quxikly and decreased stability with fatigue.       High Level Balance   High Level Balance Comments tandem stance eyes open and eyes closed 2x30sec; narrow base eyes open and eyes closed 2x30 sec hold  hell rocking with balance catch 2x10 on air-ex 2x10        Knee/Hip Exercises: Aerobic   Nustep L4x69min      Knee/Hip Exercises: Standing   Heel Raises 20 reps    Hip Flexion Limitations slow march x20     Forward Lunges Limitations with 4lb weights     Hip Abduction 1 set;15 reps;Left;Right    Abduction Limitations 4lb     Hip Extension 1 set;15 reps    Extension Limitations 4lb     Lateral Step Up 10 reps;Left;Right;Step Height: 4"    Forward Step Up 1 set;10 reps;Right;Left;Step Height: 6"    Functional Squat Limitations x15    Other Standing Knee Exercises sit to stand     Other Standing Knee Exercises standing hip abduction 4lb 2x10; stending hip extension 2x10;       Knee/Hip Exercises: Supine   Short Arc Quad Sets Limitations 2x10 2lb     Bridges Limitations x20     Straight Leg Raises Limitations 2x10 bilateral     Other Supine Knee/Hip Exercises Clam shells green band x20                  PT Education - 11/20/19 1336    Education  Details reviewed balance exercises    Person(s) Educated Patient    Methods Explanation;Demonstration;Tactile cues;Verbal cues    Comprehension Verbalized understanding;Returned demonstration;Verbal cues required;Tactile cues required            PT Short Term Goals - 10/04/19 1605      PT SHORT TERM GOAL #1   Title Patient will increase gros bilateral LE strength to 4+/5    Baseline 5/5 gross     Time 3    Period Weeks    Status On-going    Target Date 09/06/19      PT SHORT TERM GOAL #2   Title Patient wil transfer sit to stand without the use of her hands    Baseline independnet with intial HEP     Time 3    Period Weeks    Target Date 09/06/19      PT SHORT TERM GOAL #3   Title Patient will ambualte 200' with LRAD without right knee hyper extension    Baseline worked on gait with cane    Time 3    Period Weeks    Status On-going    Target Date 09/06/19             PT Long Term Goals - 08/16/19 1551      PT LONG TERM GOAL #1   Title Patient will go up/down 8  steps without pain     Time 6    Period Weeks    Status New    Target Date 09/27/19      PT LONG TERM GOAL #2   Title Patient will ambalte 2000' with LRAD without antalgic gait or decreased balance    Time 6    Period Weeks    Status New    Target Date 09/27/19      PT LONG TERM GOAL #3   Title Patient will increase BERG score to 45 to decrease falls    Time 6    Period Weeks    Status New    Target Date 09/27/19                 Plan - 11/20/19 1337    Clinical Impression Statement Therapy continues to advance the patient as tolerated. She perfromed LE ther-ex with 4 lb weights today. She had beetter control of her left leg with mabualtion but continues to have decreased control as she fatigues. Therapy will perfrom a re-assessment next visit. The patient will be out of town for 21 weeks following this week.    Personal Factors and Comorbidities Comorbidity 1;Comorbidity 2    Comorbidities bilateral knee OA, DMII    Examination-Activity Limitations Sit;Transfers;Squat;Lift;Locomotion Level;Carry;Stand    Stability/Clinical Decision Making Evolving/Moderate complexity    Clinical Decision Making Moderate    Rehab Potential Good    PT Frequency 2x / week    PT Duration 6 weeks    PT Treatment/Interventions ADLs/Self Care Home Management;Cryotherapy;Electrical Stimulation;Moist Heat;Functional mobility training;Therapeutic activities;Therapeutic exercise;Patient/family education;Balance training;Neuromuscular re-education;Manual techniques;Passive range of motion;Taping    PT Next Visit Plan continue to advance weights and difficulty of ther-ex as tolerated.    PT Home Exercise Plan Access Code: [email or web address] Long Arc Quad (90-45 Degree Range) - 2 x daily - 7 x weekly - 3 sets - 10 reps.Seated Hip Abduction with Resistance - 2 x daily - 7 x weekly - 3 sets - 10 reps.Seated March - 2 x daily - 7 x weekly - 3 sets - 10 reps  Consulted and Agree with Plan of Care  Patient           Patient will benefit from skilled therapeutic intervention in order to improve the following deficits and impairments:  Abnormal gait, Pain, Decreased activity tolerance, Decreased safety awareness, Decreased range of motion, Increased muscle spasms, Difficulty walking, Decreased endurance, Impaired perceived functional ability, Increased fascial restricitons, Decreased strength  Visit Diagnosis: Other abnormalities of gait and mobility  Difficulty in walking, not elsewhere classified  Muscle weakness (generalized)  Chronic pain of left knee  Chronic pain of right knee     Problem List Patient Active Problem List   Diagnosis Date Noted  . Hypercalcemia 07/20/2019  . Pituitary macroadenoma ([place]) 07/18/2019    Carney Living PT DPT  11/21/2019, 12:55 PM  [facility] [address] [place], [place], [address] Phone: [phone]   Fax:  [phone]  Name: Deryn Massengale MRN: [id number] Date of Birth: 01/02/1951

## 2019-11-22 ENCOUNTER — Other Ambulatory Visit: Payer: Self-pay

## 2019-11-22 ENCOUNTER — Ambulatory Visit: Payer: Medicare Other | Admitting: Physical Therapy

## 2019-11-22 DIAGNOSIS — M6281 Muscle weakness (generalized): Secondary | ICD-10-CM

## 2019-11-22 DIAGNOSIS — R2689 Other abnormalities of gait and mobility: Secondary | ICD-10-CM

## 2019-11-22 DIAGNOSIS — G8929 Other chronic pain: Secondary | ICD-10-CM

## 2019-11-22 DIAGNOSIS — M25561 Pain in right knee: Secondary | ICD-10-CM

## 2019-11-22 DIAGNOSIS — R262 Difficulty in walking, not elsewhere classified: Secondary | ICD-10-CM

## 2019-11-23 ENCOUNTER — Encounter: Payer: Self-pay | Admitting: Physical Therapy

## 2019-11-23 NOTE — Therapy (Signed)
[place], [place], [address] Phone: [phone]   Fax:  [phone]  Physical Therapy Treatment/PRogress Note  Patient Details  Name: Elaine Burke MRN: [id number] Date of Birth: 1951/02/25 Referring Provider (PT): Dr Hinda Lenis    Encounter Date: 11/22/2019   Progress Note Reporting Period 10/09/2019 to 11/23/2019  See note below for Objective Data and Assessment of Progress/Goals.     .      PT End of Session - 11/23/19 1229    Visit Number 18    Number of Visits 22    Date for PT Re-Evaluation 01/04/20    Authorization Type UHC Medicare    PT Start Time 1330    PT Stop Time 1410    PT Time Calculation (min) 40 min    Activity Tolerance Patient tolerated treatment well    Behavior During Therapy WFL for tasks assessed/performed           Past Medical History:  Diagnosis Date  . Arthritis   . Arthritis   . Diabetes mellitus without complication ([place])   . HLD (hyperlipidemia)   . Hypertension     Past Surgical History:  Procedure Laterality Date  . ABDOMINAL HYSTERECTOMY    . CESAREAN SECTION    . COLONOSCOPY      There were no vitals filed for this visit.   Subjective Assessment - 11/23/19 1228    Subjective Patient reports no real changes. Her left knee was hurting a little ealeir today.    Pertinent History Bilateral knee pain,    Limitations Standing;Walking    How long can you stand comfortably? can not stand up very long    How long can you walk comfortably? can not walk very far    Diagnostic tests Brain MRI: enlargement of the pituitary gland without mass effect on the optic nerve    Patient Stated Goals better balance, be able to walk further    Currently in Pain? No/denies              [facility] PT Assessment - 11/23/19 0001      Strength   Right Hip Flexion 4+/5    Right Hip ABduction 4+/5    Left Hip Flexion 4+/5    Left Hip ABduction 4+/5       Transfers   Five time sit to stand comments  12    Comments less use of the hands       High Level Balance   High Level Balance Comments improved tandem stance balance with eyes opened and cloised       Berg Balance Test   Sit to Stand Able to stand without using hands and stabilize independently    Standing Unsupported Able to stand safely 2 minutes    Sitting with Back Unsupported but Feet Supported on Floor or Stool Able to sit safely and securely 2 minutes    Stand to Sit Controls descent by using hands    Transfers Able to transfer safely, definite need of hands    Standing Unsupported with Eyes Closed Able to stand 10 seconds with supervision    Standing Unsupported with Feet Together Able to place feet together independently and stand for 1 minute with supervision    From Standing, Reach Forward with Outstretched Arm Can reach forward >12 cm safely (5")    From Standing Position, Pick up Object from Floor Able to pick up shoe, needs supervision    From Standing  Position, Turn to Look Behind Over each Shoulder Looks behind from both sides and weight shifts well    Turn 360 Degrees Able to turn 360 degrees safely but slowly    Standing Unsupported, Alternately Place Feet on Step/Stool Able to complete >2 steps/needs minimal assist    Standing Unsupported, One Foot in Front Needs help to step but can hold 15 seconds    Standing on One Leg Unable to try or needs assist to prevent fall    Total Score 38      High Level Balance   High Level Balance Comments can come to tandem stance with min a                          [facility] Adult PT Treatment/Exercise - 11/23/19 0001      Ambulation/Gait   Gait Comments walking with eweights in the bar 4lbs 4 laps in II bars. Weights then removed  and had patient try to walk fast. Continues to have decreased control of the left leg.       Therapeutic Activites    Therapeutic Activities Other Therapeutic Activities    Other  Therapeutic Activities Stepping over hurdles and side stepping over hurdles 4 laps each; working on hip flexion fwith ambualtion to improve safety.       Knee/Hip Exercises: Standing   Heel Raises 20 reps    Hip Flexion Limitations slow march x20  4lbs     Hip Abduction 1 set;15 reps;Left;Right    Abduction Limitations 4lb     Hip Extension 1 set;15 reps    Extension Limitations 4lb     Lateral Step Up 10 reps;Left;Right;Step Height: 6"    Forward Step Up 1 set;10 reps;Right;Left;Step Height: 6"    Functional Squat Limitations x20    Other Standing Knee Exercises kettle bell lift 10lbs x20 off 8 inch step       Knee/Hip Exercises: Supine   Bridges Limitations x20     Straight Leg Raises Limitations x15 bilateral     Other Supine Knee/Hip Exercises Clam shells green band x20                  PT Education - 11/23/19 1228    Education Details reviewed functional scores    Person(s) Educated Patient    Methods Explanation;Demonstration;Tactile cues;Verbal cues    Comprehension Returned demonstration;Verbal cues required;Tactile cues required;Verbalized understanding            PT Short Term Goals - 10/04/19 1605      PT SHORT TERM GOAL #1   Title Patient will increase gros bilateral LE strength to 4+/5    Baseline 5/5 gross     Time 3    Period Weeks    Status On-going    Target Date 09/06/19      PT SHORT TERM GOAL #2   Title Patient wil transfer sit to stand without the use of her hands    Baseline independnet with intial HEP     Time 3    Period Weeks    Target Date 09/06/19      PT SHORT TERM GOAL #3   Title Patient will ambualte 200' with LRAD without right knee hyper extension    Baseline worked on gait with cane    Time 3    Period Weeks    Status On-going    Target Date 09/06/19  PT Long Term Goals - 08/16/19 1551      PT LONG TERM GOAL #1   Title Patient will go up/down 8 steps without pain     Time 6    Period Weeks    Status  New    Target Date 09/27/19      PT LONG TERM GOAL #2   Title Patient will ambalte 2000' with LRAD without antalgic gait or decreased balance    Time 6    Period Weeks    Status New    Target Date 09/27/19      PT LONG TERM GOAL #3   Title Patient will increase BERG score to 45 to decrease falls    Time 6    Period Weeks    Status New    Target Date 09/27/19                 Plan - 11/23/19 1229    Clinical Impression Statement Therapy perfromed re-assesemnt on paitint today. Her strength is about the same. She has had a minor increase in BERG balance score. she showed imporvement in 5x sit to stand transfer. Overall she has had minimal fucntional improvement, She continues to use her walker and has limited endurance with activity. She has4 more visits scheduled. Over those next 4 visits we will work on making sure she has a good understanding of her home program and how to use it. She feels like she is stronger but she still has deficits in balance and coordiation. We will continue with PT 1W4 to go over home program and continue to progress higher level stability and balance exercises as tolerated.    Personal Factors and Comorbidities Comorbidity 1;Comorbidity 2    Comorbidities bilateral knee OA, DMII    Examination-Activity Limitations Sit;Transfers;Squat;Lift;Locomotion Level;Carry;Stand    Examination-Participation Restrictions Shop;Laundry;Cleaning;Driving    Stability/Clinical Decision Making Evolving/Moderate complexity    Clinical Decision Making Moderate    Rehab Potential Good    PT Frequency 1x / week    PT Duration 6 weeks    PT Treatment/Interventions ADLs/Self Care Home Management;Cryotherapy;Electrical Stimulation;Moist Heat;Functional mobility training;Therapeutic activities;Therapeutic exercise;Patient/family education;Balance training;Neuromuscular re-education;Manual techniques;Passive range of motion;Taping    PT Next Visit Plan continue to advance weights  and difficulty of ther-ex as tolerated.    PT Home Exercise Plan Access Code: [email or web address] Long Arc Quad (90-45 Degree Range) - 2 x daily - 7 x weekly - 3 sets - 10 reps.Seated Hip Abduction with Resistance - 2 x daily - 7 x weekly - 3 sets - 10 reps.Seated March - 2 x daily - 7 x weekly - 3 sets - 10 reps    Consulted and Agree with Plan of Care Patient           Patient will benefit from skilled therapeutic intervention in order to improve the following deficits and impairments:  Abnormal gait, Pain, Decreased activity tolerance, Decreased safety awareness, Decreased range of motion, Increased muscle spasms, Difficulty walking, Decreased endurance, Impaired perceived functional ability, Increased fascial restricitons, Decreased strength  Visit Diagnosis: Other abnormalities of gait and mobility  Difficulty in walking, not elsewhere classified  Muscle weakness (generalized)  Chronic pain of left knee  Chronic pain of right knee     Problem List Patient Active Problem List   Diagnosis Date Noted  . Hypercalcemia 07/20/2019  . Pituitary macroadenoma ([place]) 07/18/2019    Carney Living 11/23/2019, 12:35 PM  [facility] [place],  [place], [address] Phone: [phone]   Fax:  [phone]  Name: Elaine Burke MRN: [id number] Date of Birth: 01/16/1951

## 2019-11-29 ENCOUNTER — Ambulatory Visit: Payer: Medicare Other | Admitting: Physical Therapy

## 2019-12-01 ENCOUNTER — Ambulatory Visit: Payer: Medicare Other | Admitting: Physical Therapy

## 2019-12-08 ENCOUNTER — Ambulatory Visit: Payer: Medicare Other | Admitting: Physical Therapy

## 2019-12-13 ENCOUNTER — Other Ambulatory Visit: Payer: Self-pay

## 2019-12-13 ENCOUNTER — Ambulatory Visit: Payer: Medicare Other | Attending: Family Medicine | Admitting: Physical Therapy

## 2019-12-13 ENCOUNTER — Encounter: Payer: Self-pay | Admitting: Physical Therapy

## 2019-12-13 DIAGNOSIS — R262 Difficulty in walking, not elsewhere classified: Secondary | ICD-10-CM | POA: Diagnosis present

## 2019-12-13 DIAGNOSIS — M25561 Pain in right knee: Secondary | ICD-10-CM | POA: Insufficient documentation

## 2019-12-13 DIAGNOSIS — R2689 Other abnormalities of gait and mobility: Secondary | ICD-10-CM

## 2019-12-13 DIAGNOSIS — G8929 Other chronic pain: Secondary | ICD-10-CM | POA: Insufficient documentation

## 2019-12-13 DIAGNOSIS — M6281 Muscle weakness (generalized): Secondary | ICD-10-CM

## 2019-12-13 DIAGNOSIS — M25562 Pain in left knee: Secondary | ICD-10-CM | POA: Diagnosis present

## 2019-12-14 ENCOUNTER — Encounter: Payer: Self-pay | Admitting: Physical Therapy

## 2019-12-14 NOTE — Therapy (Signed)
[place], [place], [address] Phone: [phone]   Fax:  [phone]  Physical Therapy Treatment  Patient Details  Name: Namiko Pritts MRN: [id number] Date of Birth: 07-27-1950 Referring Provider (PT): Dr Hinda Lenis    Encounter Date: 12/13/2019   PT End of Session - 12/13/19 1422    Visit Number 19    Number of Visits 22    Date for PT Re-Evaluation 01/04/20    Authorization Type UHC Medicare    PT Start Time 1415    PT Stop Time 1458    PT Time Calculation (min) 43 min    Equipment Utilized During Treatment Gait belt    Activity Tolerance Patient tolerated treatment well    Behavior During Therapy [facility] for tasks assessed/performed           Past Medical History:  Diagnosis Date  . Arthritis   . Arthritis   . Diabetes mellitus without complication ([place])   . HLD (hyperlipidemia)   . Hypertension     Past Surgical History:  Procedure Laterality Date  . ABDOMINAL HYSTERECTOMY    . CESAREAN SECTION    . COLONOSCOPY      There were no vitals filed for this visit.   Subjective Assessment - 12/13/19 1421    Subjective Patient has been out for a few weeks. She had some increase in pain with her knees. She had injections which seemed to help. She hasn't had any pain in the knees today.    Pertinent History Bilateral knee pain,    Limitations Standing;Walking    How long can you stand comfortably? can not stand up very long    How long can you walk comfortably? can not walk very far    Diagnostic tests Brain MRI: enlargement of the pituitary gland without mass effect on the optic nerve    Patient Stated Goals better balance, be able to walk further    Currently in Pain? No/denies                             [facility] Adult PT Treatment/Exercise - 12/14/19 0001      High Level Balance   High Level Balance Comments tandem eo anc ec 2x30 sec each; step onto air-ex  x20 each; lateral step onto air-ex x20 seahc; hurdle step over x20       Knee/Hip Exercises: Aerobic   Nustep L4x47min      Knee/Hip Exercises: Standing   Heel Raises 20 reps    Hip Flexion Limitations slow march x20  4lbs     Lateral Step Up 10 reps;Left;Right;Step Height: 6"    Forward Step Up 1 set;10 reps;Right;Left;Step Height: 6"    Functional Squat Limitations x20                  PT Education - 12/13/19 1422    Education Details HEP and symptom mangement    Person(s) Educated Patient    Methods Explanation;Demonstration;Tactile cues;Verbal cues    Comprehension Verbalized understanding;Returned demonstration;Verbal cues required;Tactile cues required            PT Short Term Goals - 10/04/19 1605      PT SHORT TERM GOAL #1   Title Patient will increase gros bilateral LE strength to 4+/5    Baseline 5/5 gross     Time 3    Period Weeks    Status On-going  Target Date 09/06/19      PT SHORT TERM GOAL #2   Title Patient wil transfer sit to stand without the use of her hands    Baseline independnet with intial HEP     Time 3    Period Weeks    Target Date 09/06/19      PT SHORT TERM GOAL #3   Title Patient will ambualte 200' with LRAD without right knee hyper extension    Baseline worked on gait with cane    Time 3    Period Weeks    Status On-going    Target Date 09/06/19             PT Long Term Goals - 08/16/19 1551      PT LONG TERM GOAL #1   Title Patient will go up/down 8 steps without pain     Time 6    Period Weeks    Status New    Target Date 09/27/19      PT LONG TERM GOAL #2   Title Patient will ambalte 2000' with LRAD without antalgic gait or decreased balance    Time 6    Period Weeks    Status New    Target Date 09/27/19      PT LONG TERM GOAL #3   Title Patient will increase BERG score to 45 to decrease falls    Time 6    Period Weeks    Status New    Target Date 09/27/19                 Plan - 12/13/19  1503    Clinical Impression Statement Despite layoff from therapy the patients balance appeards to have improved. She required only supervision with balance on the mat with eyes closed. Therapy continued to focus on standing exercises. She reported no increase in knee pain with exercises . Therapy will continue to progress as tolerated.    Comorbidities bilateral knee OA, DMII    Examination-Activity Limitations Sit;Transfers;Squat;Lift;Locomotion Level;Carry;Stand    Examination-Participation Restrictions Shop;Laundry;Cleaning;Driving    Stability/Clinical Decision Making Evolving/Moderate complexity    Clinical Decision Making Moderate    Rehab Potential Good    PT Frequency 1x / week    PT Duration 6 weeks    PT Treatment/Interventions ADLs/Self Care Home Management;Cryotherapy;Electrical Stimulation;Moist Heat;Functional mobility training;Therapeutic activities;Therapeutic exercise;Patient/family education;Balance training;Neuromuscular re-education;Manual techniques;Passive range of motion;Taping    PT Next Visit Plan continue to advance weights and difficulty of ther-ex as tolerated.    PT Home Exercise Plan Access Code: [email or web address] Long Arc Quad (90-45 Degree Range) - 2 x daily - 7 x weekly - 3 sets - 10 reps.Seated Hip Abduction with Resistance - 2 x daily - 7 x weekly - 3 sets - 10 reps.Seated March - 2 x daily - 7 x weekly - 3 sets - 10 reps    Consulted and Agree with Plan of Care Patient           Patient will benefit from skilled therapeutic intervention in order to improve the following deficits and impairments:  Abnormal gait, Pain, Decreased activity tolerance, Decreased safety awareness, Decreased range of motion, Increased muscle spasms, Difficulty walking, Decreased endurance, Impaired perceived functional ability, Increased fascial restricitons, Decreased strength  Visit Diagnosis: Other abnormalities of gait and mobility  Difficulty in walking, not elsewhere  classified  Muscle weakness (generalized)  Chronic pain of left knee  Chronic pain of right knee     Problem List Patient Active  Problem List   Diagnosis Date Noted  . Hypercalcemia 07/20/2019  . Pituitary macroadenoma ([place]) 07/18/2019    Carney Living PT DPT  12/14/2019, 12:13 PM  [facility] [address] [place], [place], [address] Phone: [phone]   Fax:  [phone]  Name: Balinda Heacock MRN: [id number] Date of Birth: Jul 13, 1950

## 2019-12-20 ENCOUNTER — Ambulatory Visit: Payer: Medicare Other | Admitting: Physical Therapy

## 2019-12-20 ENCOUNTER — Encounter: Payer: Self-pay | Admitting: Physical Therapy

## 2019-12-20 ENCOUNTER — Other Ambulatory Visit: Payer: Self-pay

## 2019-12-20 DIAGNOSIS — R2689 Other abnormalities of gait and mobility: Secondary | ICD-10-CM

## 2019-12-20 DIAGNOSIS — G8929 Other chronic pain: Secondary | ICD-10-CM

## 2019-12-20 DIAGNOSIS — M6281 Muscle weakness (generalized): Secondary | ICD-10-CM

## 2019-12-20 DIAGNOSIS — R262 Difficulty in walking, not elsewhere classified: Secondary | ICD-10-CM

## 2019-12-21 NOTE — Therapy (Signed)
[place], [place], [address] Phone: [phone]   Fax:  [phone]  Physical Therapy Treatment  Patient Details  Name: Elaine Burke MRN: [id number] Date of Birth: 02-06-1951 Referring Provider (PT): Dr Hinda Lenis    Encounter Date: 12/20/2019   PT End of Session - 12/20/19 1554    Visit Number 20    Number of Visits 22    Date for PT Re-Evaluation 01/04/20    Authorization Type [employer] Medicare    PT Start Time 1546    PT Stop Time 1628    PT Time Calculation (min) 42 min    Activity Tolerance Patient tolerated treatment well    Behavior During Therapy [facility] for tasks assessed/performed           Past Medical History:  Diagnosis Date  . Arthritis   . Arthritis   . Diabetes mellitus without complication ([place])   . HLD (hyperlipidemia)   . Hypertension     Past Surgical History:  Procedure Laterality Date  . ABDOMINAL HYSTERECTOMY    . CESAREAN SECTION    . COLONOSCOPY      There were no vitals filed for this visit.   Subjective Assessment - 12/20/19 1553    Subjective Patient reports the back of her right knee has been hurting. She is otherwise doing well.    Pertinent History Bilateral knee pain,    Limitations Standing;Walking    How long can you walk comfortably? can not walk very far    Diagnostic tests Brain MRI: enlargement of the pituitary gland without mass effect on the optic nerve    Patient Stated Goals better balance, be able to walk further    Currently in Pain? Yes    Pain Score 3     Pain Location Knee    Pain Orientation Left    Pain Descriptors / Indicators Aching    Pain Type Chronic pain    Pain Onset More than a month ago    Pain Frequency Constant    Aggravating Factors  standing and walking    Pain Relieving Factors rest                             [facility] Adult PT Treatment/Exercise - 12/21/19 0001      Neuro Re-ed    Neuro  Re-ed Details  narrow base on air-ex 2x 60 sec holds; eyes closed 2x560 second holds; step over hurdles forward and back 20x each direction       Knee/Hip Exercises: Standing   Heel Raises 20 reps    Hip Flexion Limitations slow march x20  4lbs     Lateral Step Up 10 reps;Left;Right;Step Height: 6"    Forward Step Up 1 set;10 reps;Right;Left;Step Height: 6"    Functional Squat Limitations x20      Knee/Hip Exercises: Supine   Bridges Limitations x20     Straight Leg Raises Limitations x15 bilateral     Other Supine Knee/Hip Exercises Clam shells green band x20                  PT Education - 12/20/19 1554    Education Details P and symptom management    Person(s) Educated Patient    Methods Explanation;Demonstration;Tactile cues;Verbal cues    Comprehension Verbalized understanding;Returned demonstration;Verbal cues required;Tactile cues required            PT Short Term Goals -  10/04/19 1605      PT SHORT TERM GOAL #1   Title Patient will increase gros bilateral LE strength to 4+/5    Baseline 5/5 gross     Time 3    Period Weeks    Status On-going    Target Date 09/06/19      PT SHORT TERM GOAL #2   Title Patient wil transfer sit to stand without the use of her hands    Baseline independnet with intial HEP     Time 3    Period Weeks    Target Date 09/06/19      PT SHORT TERM GOAL #3   Title Patient will ambualte 200' with LRAD without right knee hyper extension    Baseline worked on gait with cane    Time 3    Period Weeks    Status On-going    Target Date 09/06/19             PT Long Term Goals - 08/16/19 1551      PT LONG TERM GOAL #1   Title Patient will go up/down 8 steps without pain     Time 6    Period Weeks    Status New    Target Date 09/27/19      PT LONG TERM GOAL #2   Title Patient will ambalte 2000' with LRAD without antalgic gait or decreased balance    Time 6    Period Weeks    Status New    Target Date 09/27/19      PT  LONG TERM GOAL #3   Title Patient will increase BERG score to 45 to decrease falls    Time 6    Period Weeks    Status New    Target Date 09/27/19                 Plan - 12/20/19 1654    Clinical Impression Statement Patient tolerated treatment well. She did good with activity on the balance mat. She only required supervision. She was alos able to tolerate hurdle stpe oers. She did have minor pain in her rightr posteririro knee. Therapy will continue to progress balance exercises as tolerated.    Personal Factors and Comorbidities Comorbidity 1;Comorbidity 2    Comorbidities bilateral knee OA, DMII    Examination-Activity Limitations Sit;Transfers;Squat;Lift;Locomotion Level;Carry;Stand    Examination-Participation Restrictions Shop;Laundry;Cleaning;Driving    Stability/Clinical Decision Making Evolving/Moderate complexity    Clinical Decision Making Moderate    Rehab Potential Good    PT Frequency 1x / week    PT Duration 6 weeks    PT Treatment/Interventions ADLs/Self Care Home Management;Cryotherapy;Electrical Stimulation;Moist Heat;Functional mobility training;Therapeutic activities;Therapeutic exercise;Patient/family education;Balance training;Neuromuscular re-education;Manual techniques;Passive range of motion;Taping    PT Next Visit Plan continue to advance weights and difficulty of ther-ex as tolerated.    PT Home Exercise Plan Access Code: [email or web address].Seated Long Arc Quad (90-45 Degree Range) - 2 x daily - 7 x weekly - 3 sets - 10 reps.Seated Hip Abduction with Resistance - 2 x daily - 7 x weekly - 3 sets - 10 reps.Seated March - 2 x daily - 7 x weekly - 3 sets - 10 reps    Consulted and Agree with Plan of Care Patient           Patient will benefit from skilled therapeutic intervention in order to improve the following deficits and impairments:  Abnormal gait, Pain, Decreased activity tolerance, Decreased safety awareness, Decreased range of motion,  Increased  muscle spasms, Difficulty walking, Decreased endurance, Impaired perceived functional ability, Increased fascial restricitons, Decreased strength  Visit Diagnosis: Other abnormalities of gait and mobility  Difficulty in walking, not elsewhere classified  Muscle weakness (generalized)  Chronic pain of left knee  Chronic pain of right knee     Problem List Patient Active Problem List   Diagnosis Date Noted  . Hypercalcemia 07/20/2019  . Pituitary macroadenoma ([place]) 07/18/2019    Carney Living PT DPT  12/21/2019, 7:29 AM  [facility] [address] [place], [place], [address] Phone: [phone]   Fax:  [phone]  Name: Elaine Burke MRN: [id number] Date of Birth: [birth year]

## 2019-12-27 ENCOUNTER — Ambulatory Visit: Payer: Medicare Other | Admitting: Physical Therapy

## 2019-12-27 ENCOUNTER — Other Ambulatory Visit: Payer: Self-pay

## 2019-12-27 ENCOUNTER — Encounter: Payer: Self-pay | Admitting: Physical Therapy

## 2019-12-27 DIAGNOSIS — R2689 Other abnormalities of gait and mobility: Secondary | ICD-10-CM | POA: Diagnosis not present

## 2019-12-27 DIAGNOSIS — R262 Difficulty in walking, not elsewhere classified: Secondary | ICD-10-CM

## 2019-12-27 DIAGNOSIS — M6281 Muscle weakness (generalized): Secondary | ICD-10-CM

## 2019-12-27 DIAGNOSIS — G8929 Other chronic pain: Secondary | ICD-10-CM

## 2019-12-27 NOTE — Therapy (Signed)
[place], [place], [address] Phone: [phone]   Fax:  [phone]  Physical Therapy Treatment/Progress  Patient Details  Name: Elaine Burke MRN: [id number] Date of Birth: [birth year] Referring Provider (PT): Dr Hinda Lenis   Progress Note Reporting Period 10/09/2019 to 10/202/2021  See note below for Objective Data and Assessment of Progress/Goals.       Encounter Date: 12/27/2019   PT End of Session - 12/27/19 1539    Visit Number 21    Number of Visits 22    Date for PT Re-Evaluation 02/07/20    Authorization Type progress note perfromed on visit 21    PT Start Time 1330    PT Stop Time 1410    PT Time Calculation (min) 40 min    Activity Tolerance Patient tolerated treatment well    Behavior During Therapy [facility] for tasks assessed/performed           Past Medical History:  Diagnosis Date  . Arthritis   . Arthritis   . Diabetes mellitus without complication ([place])   . HLD (hyperlipidemia)   . Hypertension     Past Surgical History:  Procedure Laterality Date  . ABDOMINAL HYSTERECTOMY    . CESAREAN SECTION    . COLONOSCOPY      There were no vitals filed for this visit.       [facility] PT Assessment - 12/27/19 0001      Strength   Right Hip Flexion 5/5    Right Hip ABduction 5/5    Left Hip Flexion 5/5    Left Hip ABduction 5/5      Transfers   Comments continues to use hands for sit sot astand transfer.       Ambulation/Gait   Gait Comments Patient ambualted 150' with single point cane. She continues to have decreased coordiantion of the left leg whcih increases with fatigue. She used the can ein the left hand today to [employer] the right LE.       Berg Balance Test   Sit to Stand Able to stand without using hands and stabilize independently    Standing Unsupported Able to stand safely 2 minutes    Sitting with Back Unsupported but Feet Supported on Floor  or Stool Able to sit safely and securely 2 minutes    Stand to Sit Controls descent by using hands    Transfers Able to transfer safely, definite need of hands    Standing Unsupported with Eyes Closed Able to stand 10 seconds with supervision    Standing Unsupported with Feet Together Able to place feet together independently and stand for 1 minute with supervision    From Standing, Reach Forward with Outstretched Arm Can reach forward >12 cm safely (5")    From Standing Position, Pick up Object from [place] to pick up shoe safely and easily    From Standing Position, Turn to Look Behind Over each Shoulder Looks behind from both sides and weight shifts well    Turn 360 Degrees Able to turn 360 degrees safely but slowly    Standing Unsupported, Alternately Place Feet on Step/Stool Able to complete 4 steps without aid or supervision    Standing Unsupported, One Foot in Front Able to plae foot ahead of the other independently and hold 30 seconds    Standing on One Leg Unable to try or needs assist to prevent fall    Total Score 42  [facility] Adult PT Treatment/Exercise - 12/27/19 0001      Neuro Re-ed    Neuro Re-ed Details  narrow base on air-ex 2x 60 sec holds; eyes closed 2x560 second holds; step over hurdles forward and back 20x each direction; step onto air-ex x10 each leg and lateral step 2x10       Knee/Hip Exercises: Supine   Bridges Limitations x20     Straight Leg Raises Limitations x15 bilateral     Other Supine Knee/Hip Exercises Clam shells green band x20                    PT Short Term Goals - 10/04/19 1605      PT SHORT TERM GOAL #1   Title Patient will increase gros bilateral LE strength to 4+/5    Baseline 5/5 gross     Time 3    Period Weeks    Status On-going    Target Date 09/06/19      PT SHORT TERM GOAL #2   Title Patient wil transfer sit to stand without the use of her hands    Baseline independnet with intial  HEP     Time 3    Period Weeks    Target Date 09/06/19      PT SHORT TERM GOAL #3   Title Patient will ambualte 200' with LRAD without right knee hyper extension    Baseline worked on gait with cane    Time 3    Period Weeks    Status On-going    Target Date 09/06/19             PT Long Term Goals - 08/16/19 1551      PT LONG TERM GOAL #1   Title Patient will go up/down 8 steps without pain     Time 6    Period Weeks    Status New    Target Date 09/27/19      PT LONG TERM GOAL #2   Title Patient will ambalte 2000' with LRAD without antalgic gait or decreased balance    Time 6    Period Weeks    Status New    Target Date 09/27/19      PT LONG TERM GOAL #3   Title Patient will increase BERG score to 45 to decrease falls    Time 6    Period Weeks    Status New    Target Date 09/27/19                 Plan - 12/27/19 1530    Clinical Impression Statement PatientdBeRG score has improved to 42 which moeves her out of the high fall risk with use of a walker. Ambualtion with a cane remains unchanged. She continues to lack endurance oand coordination with the cane. She used the cane in the left hand today to to [facility] the right but reported less coordiantion using it in the left hand. She has made some progress with strength and on the BERG balance scale. We will continue working with her 1W6 to continue to maximize balance and endurance training    Personal Factors and Comorbidities Comorbidity 1;Comorbidity 2    Comorbidities bilateral knee OA, DMII    Examination-Activity Limitations Sit;Transfers;Squat;Lift;Locomotion Level;Carry;Stand    Examination-Participation Restrictions Shop;Laundry;Cleaning;Driving    Stability/Clinical Decision Making Evolving/Moderate complexity    Clinical Decision Making Moderate    Rehab Potential Good    PT Frequency 1x / week  PT Duration 6 weeks    PT Treatment/Interventions ADLs/Self Care Home  Management;Cryotherapy;Electrical Stimulation;Moist Heat;Functional mobility training;Therapeutic activities;Therapeutic exercise;Patient/family education;Balance training;Neuromuscular re-education;Manual techniques;Passive range of motion;Taping    PT Next Visit Plan continue to advance weights and difficulty of ther-ex as tolerated.    PT Home Exercise Plan Access Code: [email or web address].Seated Long Arc Quad (90-45 Degree Range) - 2 x daily - 7 x weekly - 3 sets - 10 reps.Seated Hip Abduction with Resistance - 2 x daily - 7 x weekly - 3 sets - 10 reps.Seated March - 2 x daily - 7 x weekly - 3 sets - 10 reps           Patient will benefit from skilled therapeutic intervention in order to improve the following deficits and impairments:  Abnormal gait, Pain, Decreased activity tolerance, Decreased safety awareness, Decreased range of motion, Increased muscle spasms, Difficulty walking, Decreased endurance, Impaired perceived functional ability, Increased fascial restricitons, Decreased strength  Visit Diagnosis: Other abnormalities of gait and mobility  Difficulty in walking, not elsewhere classified  Muscle weakness (generalized)  Chronic pain of left knee  Chronic pain of right knee     Problem List Patient Active Problem List   Diagnosis Date Noted  . Hypercalcemia 07/20/2019  . Pituitary macroadenoma ([place]) 07/18/2019    Carney Living PT DPT  12/27/2019, 3:45 PM  [facility] [address] [place], [place], [address] Phone: [phone]   Fax:  [phone]  Name: Elaine Burke MRN: [id number] Date of Birth: 1950/08/14

## 2020-01-10 ENCOUNTER — Ambulatory Visit: Payer: Medicare Other | Attending: Family Medicine | Admitting: Physical Therapy

## 2020-01-10 ENCOUNTER — Other Ambulatory Visit: Payer: Self-pay

## 2020-01-10 DIAGNOSIS — R2689 Other abnormalities of gait and mobility: Secondary | ICD-10-CM | POA: Insufficient documentation

## 2020-01-10 DIAGNOSIS — R262 Difficulty in walking, not elsewhere classified: Secondary | ICD-10-CM | POA: Diagnosis present

## 2020-01-10 DIAGNOSIS — M25562 Pain in left knee: Secondary | ICD-10-CM | POA: Diagnosis present

## 2020-01-10 DIAGNOSIS — M6281 Muscle weakness (generalized): Secondary | ICD-10-CM | POA: Diagnosis present

## 2020-01-10 DIAGNOSIS — M25561 Pain in right knee: Secondary | ICD-10-CM | POA: Diagnosis present

## 2020-01-10 DIAGNOSIS — G8929 Other chronic pain: Secondary | ICD-10-CM | POA: Insufficient documentation

## 2020-01-11 NOTE — Therapy (Signed)
[place], [place], [address] Phone: [phone]   Fax:  [phone]  Physical Therapy Treatment  Patient Details  Name: Elaine Burke MRN: [id number] Date of Birth: 1950/08/21 Referring Provider (PT): Dr Hinda Lenis    Encounter Date: 01/10/2020   PT End of Session - 01/11/20 1131    Visit Number 22    Number of Visits 28    Authorization Type progress note perfromed on visit 21    PT Start Time 1503    PT Stop Time 1545    PT Time Calculation (min) 42 min    Activity Tolerance Patient tolerated treatment well    Behavior During Therapy [facility] for tasks assessed/performed           Past Medical History:  Diagnosis Date  . Arthritis   . Arthritis   . Diabetes mellitus without complication ([place])   . HLD (hyperlipidemia)   . Hypertension     Past Surgical History:  Procedure Laterality Date  . ABDOMINAL HYSTERECTOMY    . CESAREAN SECTION    . COLONOSCOPY      There were no vitals filed for this visit.   Subjective Assessment - 01/10/20 1510    Subjective Pt reports her knee is feeling better and overall she's doing well today.    Pertinent History Bilateral knee pain,    Limitations Standing;Walking    How long can you walk comfortably? can not walk very far    Diagnostic tests Brain MRI: enlargement of the pituitary gland without mass effect on the optic nerve    Patient Stated Goals better balance, be able to walk further    Pain Onset More than a month ago                             [facility] Adult PT Treatment/Exercise - 01/11/20 0001      Transfers   Comments continues to use hands for sit sot astand transfer.       Ambulation/Gait   Gait Comments Patient ambualted 150' with single point cane. She continues to have decreased coordiantion of the left leg whcih increases with fatigue. She used the can ein the left hand today to [employer] the right LE.        Balance   Balance Assessed Yes      High Level Balance   High Level Balance Comments box stepping x10 each directionforward and back stepping with rythm 2x10; hurdle stepping 2x10 forward 4 laps forward left foot lead and right foot lead; side stpping 2x length of the II bars; tandem stance on air-ex 3x30 second holds;       Neuro Re-ed    Neuro Re-ed Details  narrow base on air-ex 2x 60 sec holds; eyes closed 2x560 second holds; step over hurdles forward and back 20x each direction; step onto air-ex x10 each leg and lateral step 2x10       Knee/Hip Exercises: Seated   Sit to Sand 10 reps      Knee/Hip Exercises: Supine   Bridges Limitations x20     Straight Leg Raises Limitations x15 bilateral     Other Supine Knee/Hip Exercises Clam shells green band x20                    PT Short Term Goals - 10/04/19 1605      PT SHORT TERM GOAL #1  Title Patient will increase gros bilateral LE strength to 4+/5    Baseline 5/5 gross     Time 3    Period Weeks    Status On-going    Target Date 09/06/19      PT SHORT TERM GOAL #2   Title Patient wil transfer sit to stand without the use of her hands    Baseline independnet with intial HEP     Time 3    Period Weeks    Target Date 09/06/19      PT SHORT TERM GOAL #3   Title Patient will ambualte 200' with LRAD without right knee hyper extension    Baseline worked on gait with cane    Time 3    Period Weeks    Status On-going    Target Date 09/06/19             PT Long Term Goals - 08/16/19 1551      PT LONG TERM GOAL #1   Title Patient will go up/down 8 steps without pain     Time 6    Period Weeks    Status New    Target Date 09/27/19      PT LONG TERM GOAL #2   Title Patient will ambalte 2000' with LRAD without antalgic gait or decreased balance    Time 6    Period Weeks    Status New    Target Date 09/27/19      PT LONG TERM GOAL #3   Title Patient will increase BERG score to 45 to decrease falls     Time 6    Period Weeks    Status New    Target Date 09/27/19                 Plan - 01/11/20 1132    Clinical Impression Statement Patient tolerated treatment well. She continues to have limited ability to use the cane. Therapy continues to focus on coordination and balance. She would benefit from further skilled ther    Personal Factors and Comorbidities Comorbidity 1;Comorbidity 2    Comorbidities bilateral knee OA, DMII    Examination-Activity Limitations Sit;Transfers;Squat;Lift;Locomotion Level;Carry;Stand    Examination-Participation Restrictions Shop;Laundry;Cleaning;Driving    Stability/Clinical Decision Making Evolving/Moderate complexity    Clinical Decision Making Moderate    Rehab Potential Good    PT Frequency 1x / week    PT Duration 6 weeks    PT Treatment/Interventions ADLs/Self Care Home Management;Cryotherapy;Electrical Stimulation;Moist Heat;Functional mobility training;Therapeutic activities;Therapeutic exercise;Patient/family education;Balance training;Neuromuscular re-education;Manual techniques;Passive range of motion;Taping    PT Next Visit Plan continue to advance weights and difficulty of ther-ex as tolerated.    PT Home Exercise Plan Access Code: [email or web address].Seated Long Arc Quad (90-45 Degree Range) - 2 x daily - 7 x weekly - 3 sets - 10 reps.Seated Hip Abduction with Resistance - 2 x daily - 7 x weekly - 3 sets - 10 reps.Seated March - 2 x daily - 7 x weekly - 3 sets - 10 reps    Consulted and Agree with Plan of Care Patient           Patient will benefit from skilled therapeutic intervention in order to improve the following deficits and impairments:  Abnormal gait, Pain, Decreased activity tolerance, Decreased safety awareness, Decreased range of motion, Increased muscle spasms, Difficulty walking, Decreased endurance, Impaired perceived functional ability, Increased fascial restricitons, Decreased strength  Visit Diagnosis: Other  abnormalities of gait and mobility  Difficulty in walking,  not elsewhere classified  Muscle weakness (generalized)  Chronic pain of left knee  Chronic pain of right knee     Problem List Patient Active Problem List   Diagnosis Date Noted  . Hypercalcemia 07/20/2019  . Pituitary macroadenoma ([place]) 07/18/2019    Carney Living PT DPT  01/11/2020, 11:40 AM  [facility] [address] [place], [place], [address] Phone: [phone]   Fax:  [phone]  Name: Elaine Burke MRN: [id number] Date of Birth: 1950-09-17

## 2020-01-16 ENCOUNTER — Ambulatory Visit: Payer: Medicare Other | Admitting: Physical Therapy

## 2020-01-16 ENCOUNTER — Other Ambulatory Visit: Payer: Self-pay

## 2020-01-16 DIAGNOSIS — R262 Difficulty in walking, not elsewhere classified: Secondary | ICD-10-CM

## 2020-01-16 DIAGNOSIS — R2689 Other abnormalities of gait and mobility: Secondary | ICD-10-CM

## 2020-01-16 DIAGNOSIS — G8929 Other chronic pain: Secondary | ICD-10-CM

## 2020-01-16 DIAGNOSIS — M25561 Pain in right knee: Secondary | ICD-10-CM

## 2020-01-16 DIAGNOSIS — M6281 Muscle weakness (generalized): Secondary | ICD-10-CM

## 2020-01-17 ENCOUNTER — Encounter: Payer: Self-pay | Admitting: Physical Therapy

## 2020-01-17 NOTE — Therapy (Signed)
[place], [place], [address] Phone: [phone]   Fax:  [phone]  Physical Therapy Treatment  Patient Details  Name: Elaine Burke MRN: [id number] Date of Birth: 1950-09-10 Referring Provider (PT): Dr Hinda Lenis    Encounter Date: 01/16/2020   PT End of Session - 01/16/20 1644    Visit Number 23    Number of Visits 28    Date for PT Re-Evaluation 02/07/20    Authorization Type progress note perfromed on visit 21    PT Start Time 1630    PT Stop Time 1710    PT Time Calculation (min) 40 min    Activity Tolerance Patient tolerated treatment well    Behavior During Therapy [facility] for tasks assessed/performed           Past Medical History:  Diagnosis Date   Arthritis    Arthritis    Diabetes mellitus without complication ([place])    HLD (hyperlipidemia)    Hypertension     Past Surgical History:  Procedure Laterality Date   ABDOMINAL HYSTERECTOMY     CESAREAN SECTION     COLONOSCOPY      There were no vitals filed for this visit.   Subjective Assessment - 01/16/20 1640    Subjective Patient reports she did well over the weekend. She is having no pain today.    Pertinent History Bilateral knee pain,    Limitations Standing;Walking    How long can you stand comfortably? can not stand up very long    How long can you walk comfortably? can not walk very far    Diagnostic tests Brain MRI: enlargement of the pituitary gland without mass effect on the optic nerve    Patient Stated Goals better balance, be able to walk further    Currently in Pain? Yes    Pain Score 3     Pain Location Knee    Pain Orientation Left    Pain Descriptors / Indicators Aching    Pain Type Chronic pain    Pain Onset More than a month ago    Pain Frequency Constant    Aggravating Factors  standing and walking    Pain Relieving Factors rest    Multiple Pain Sites No                              [facility] Adult PT Treatment/Exercise - 01/17/20 0001      High Level Balance   High Level Balance Comments box stepping x10 each directionforward and back stepping with rythm 2x10; hurdle stepping 2x10 forward 4 laps forward left foot lead and right foot lead; side stpping 2x length of the II bars; tandem stance on air-ex 3x30 second holds;       Knee/Hip Exercises: Standing   Lateral Step Up 10 reps;Left;Right;Step Height: 6"    Forward Step Up 1 set;10 reps;Right;Left;Step Height: 6"    Functional Squat 2 sets;10 reps      Knee/Hip Exercises: Seated   Sit to Sand 10 reps      Knee/Hip Exercises: Supine   Bridges Limitations x20     Straight Leg Raises Limitations x15 bilateral     Other Supine Knee/Hip Exercises Clam shells green band x20                  PT Education - 01/16/20 1643    Education Details reviewedprogression of balance exercises  Person(s) Educated Patient    Methods Explanation;Demonstration;Tactile cues;Verbal cues    Comprehension Verbalized understanding;Returned demonstration;Verbal cues required;Tactile cues required            PT Short Term Goals - 10/04/19 1605      PT SHORT TERM GOAL #1   Title Patient will increase gros bilateral LE strength to 4+/5    Baseline 5/5 gross     Time 3    Period Weeks    Status On-going    Target Date 09/06/19      PT SHORT TERM GOAL #2   Title Patient wil transfer sit to stand without the use of her hands    Baseline independnet with intial HEP     Time 3    Period Weeks    Target Date 09/06/19      PT SHORT TERM GOAL #3   Title Patient will ambualte 200' with LRAD without right knee hyper extension    Baseline worked on gait with cane    Time 3    Period Weeks    Status On-going    Target Date 09/06/19             PT Long Term Goals - 08/16/19 1551      PT LONG TERM GOAL #1   Title Patient will go up/down 8 steps without pain     Time 6    Period  Weeks    Status New    Target Date 09/27/19      PT LONG TERM GOAL #2   Title Patient will ambalte 2000' with LRAD without antalgic gait or decreased balance    Time 6    Period Weeks    Status New    Target Date 09/27/19      PT LONG TERM GOAL #3   Title Patient will increase BERG score to 45 to decrease falls    Time 6    Period Weeks    Status New    Target Date 09/27/19                 Plan - 01/17/20 1519    Clinical Impression Statement Therapy focused on balance and movement drills today. she continues to have decrezased coordiation of left leg with hurldes. She reported fatigue after treatment but no significant pain. She had some difficulty with walking a tandem line. She did well with coodination drills such as toe tap on a step.    Personal Factors and Comorbidities Comorbidity 1;Comorbidity 2    Comorbidities bilateral knee OA, DMII    Examination-Activity Limitations Sit;Transfers;Squat;Lift;Locomotion Level;Carry;Stand    Examination-Participation Restrictions Shop;Laundry;Cleaning;Driving    Stability/Clinical Decision Making Evolving/Moderate complexity    Clinical Decision Making Moderate    Rehab Potential Good    PT Duration 6 weeks    PT Treatment/Interventions ADLs/Self Care Home Management;Cryotherapy;Electrical Stimulation;Moist Heat;Functional mobility training;Therapeutic activities;Therapeutic exercise;Patient/family education;Balance training;Neuromuscular re-education;Manual techniques;Passive range of motion;Taping    PT Next Visit Plan continue to advance weights and difficulty of ther-ex as tolerated.           Patient will benefit from skilled therapeutic intervention in order to improve the following deficits and impairments:  Abnormal gait, Pain, Decreased activity tolerance, Decreased safety awareness, Decreased range of motion, Increased muscle spasms, Difficulty walking, Decreased endurance, Impaired perceived functional ability,  Increased fascial restricitons, Decreased strength  Visit Diagnosis: Other abnormalities of gait and mobility  Difficulty in walking, not elsewhere classified  Muscle weakness (generalized)  Chronic pain  of left knee  Chronic pain of right knee     Problem List Patient Active Problem List   Diagnosis Date Noted   Hypercalcemia 07/20/2019   Pituitary macroadenoma ([place]) 07/18/2019    Carney Living PT DPT  01/17/2020, 4:42 PM  [facility] [address] [place], [place], [address] Phone: [phone]   Fax:  [phone]  Name: Merlin Ege MRN: [id number] Date of Birth: 1951/02/13

## 2020-01-22 ENCOUNTER — Other Ambulatory Visit: Payer: Self-pay

## 2020-01-22 ENCOUNTER — Encounter: Payer: Self-pay | Admitting: Physical Therapy

## 2020-01-22 ENCOUNTER — Ambulatory Visit: Payer: Medicare Other | Admitting: Physical Therapy

## 2020-01-22 DIAGNOSIS — G8929 Other chronic pain: Secondary | ICD-10-CM

## 2020-01-22 DIAGNOSIS — M6281 Muscle weakness (generalized): Secondary | ICD-10-CM

## 2020-01-22 DIAGNOSIS — R262 Difficulty in walking, not elsewhere classified: Secondary | ICD-10-CM

## 2020-01-22 DIAGNOSIS — R2689 Other abnormalities of gait and mobility: Secondary | ICD-10-CM

## 2020-01-22 NOTE — Therapy (Signed)
[place], [place], [address] Phone: [phone]   Fax:  [phone]  Physical Therapy Treatment  Patient Details  Name: Elaine Burke MRN: [id number] Date of Birth: 11-May-1950 Referring Provider (PT): Dr Hinda Lenis    Encounter Date: 01/22/2020   PT End of Session - 01/22/20 1342    Visit Number 24    Number of Visits 28    Date for PT Re-Evaluation 02/07/20    Authorization Type progress note perfromed on visit 21    PT Start Time 1335    PT Stop Time 1415    PT Time Calculation (min) 40 min    Activity Tolerance Patient tolerated treatment well    Behavior During Therapy [facility] for tasks assessed/performed           Past Medical History:  Diagnosis Date   Arthritis    Arthritis    Diabetes mellitus without complication ([place])    HLD (hyperlipidemia)    Hypertension     Past Surgical History:  Procedure Laterality Date   ABDOMINAL HYSTERECTOMY     CESAREAN SECTION     COLONOSCOPY      There were no vitals filed for this visit.   Subjective Assessment - 01/22/20 1341    Subjective Patient has no complaints at this time. She has been working on her execises. She reports her knee felt pretty good over the weekend.    Pertinent History Bilateral knee pain,    Limitations Standing;Walking    How long can you stand comfortably? can not stand up very long    How long can you walk comfortably? can not walk very far    Diagnostic tests Brain MRI: enlargement of the pituitary gland without mass effect on the optic nerve    Patient Stated Goals better balance, be able to walk further    Currently in Pain? No/denies                             [facility] Adult PT Treatment/Exercise - 01/22/20 0001      High Level Balance   High Level Balance Comments box stepping x10 each directionforward and back stepping with rythm 2x10; hurdle stepping 2x10 forward 4 laps  forward left foot lead and right foot lead; side stpping 2x length of the II bars; tandem stance on air-ex 3x30 second holds;       Neuro Re-ed    Neuro Re-ed Details  narrow base on air-ex 2x 60 sec holds; eyes closed 2x560 second holds; step over hurdles forward and back 20x each direction; step onto air-ex x10       Knee/Hip Exercises: Standing   Lateral Step Up 10 reps;Left;Right;Step Height: 6"    Forward Step Up 1 set;10 reps;Right;Left;Step Height: 6"    Functional Squat 2 sets;10 reps      Knee/Hip Exercises: Supine   Bridges Limitations x20     Straight Leg Raises Limitations x15 bilateral     Other Supine Knee/Hip Exercises Clam shells g blue  band x20                  PT Education - 01/22/20 1341    Education Details HEP, symptom mangement    Person(s) Educated Patient    Methods Explanation;Tactile cues;Demonstration;Verbal cues    Comprehension Verbalized understanding;Verbal cues required;Returned demonstration;Tactile cues required            PT  Short Term Goals - 10/04/19 1605      PT SHORT TERM GOAL #1   Title Patient will increase gros bilateral LE strength to 4+/5    Baseline 5/5 gross     Time 3    Period Weeks    Status On-going    Target Date 09/06/19      PT SHORT TERM GOAL #2   Title Patient wil transfer sit to stand without the use of her hands    Baseline independnet with intial HEP     Time 3    Period Weeks    Target Date 09/06/19      PT SHORT TERM GOAL #3   Title Patient will ambualte 200' with LRAD without right knee hyper extension    Baseline worked on gait with cane    Time 3    Period Weeks    Status On-going    Target Date 09/06/19             PT Long Term Goals - 08/16/19 1551      PT LONG TERM GOAL #1   Title Patient will go up/down 8 steps without pain     Time 6    Period Weeks    Status New    Target Date 09/27/19      PT LONG TERM GOAL #2   Title Patient will ambalte 2000' with LRAD without antalgic  gait or decreased balance    Time 6    Period Weeks    Status New    Target Date 09/27/19      PT LONG TERM GOAL #3   Title Patient will increase BERG score to 45 to decrease falls    Time 6    Period Weeks    Status New    Target Date 09/27/19                 Plan - 01/22/20 1342    Clinical Impression Statement Patient continues to have coordination issues with movemnet but can perfrom coordination activity standing. She tolerated ther-ex well. Therapy will continue to progress as tolerated.    Personal Factors and Comorbidities Comorbidity 1;Comorbidity 2    Comorbidities bilateral knee OA, DMII    Examination-Activity Limitations Sit;Transfers;Squat;Lift;Locomotion Level;Carry;Stand    Examination-Participation Restrictions Shop;Laundry;Cleaning;Driving    PT Treatment/Interventions ADLs/Self Care Home Management;Cryotherapy;Electrical Stimulation;Moist Heat;Functional mobility training;Therapeutic activities;Therapeutic exercise;Patient/family education;Balance training;Neuromuscular re-education;Manual techniques;Passive range of motion;Taping    PT Home Exercise Plan Access Code: [email or web address] Long Arc Quad (90-45 Degree Range) - 2 x daily - 7 x weekly - 3 sets - 10 repsSeated Hip Abduction with Resistance - 2 x daily - 7 x weekly - 3 sets - 10 repsSeated March - 2 x daily - 7 x weekly - 3 sets - 10 reps           Patient will benefit from skilled therapeutic intervention in order to improve the following deficits and impairments:     Visit Diagnosis: Other abnormalities of gait and mobility  Difficulty in walking, not elsewhere classified  Muscle weakness (generalized)  Chronic pain of left knee  Chronic pain of right knee     Problem List Patient Active Problem List   Diagnosis Date Noted   Hypercalcemia 07/20/2019   Pituitary macroadenoma ([place]) 07/18/2019    Carney Living PT DPT  01/22/2020, 2:22 PM  [place] [facility] [address] [place], [place], [address] Phone: [phone]   Fax:  [phone]  Name: Amara Justen MRN: [id number] Date of Birth: Aug 31, 1950

## 2020-01-29 ENCOUNTER — Other Ambulatory Visit: Payer: Self-pay

## 2020-01-29 ENCOUNTER — Ambulatory Visit: Payer: Medicare Other | Admitting: Physical Therapy

## 2020-01-29 DIAGNOSIS — M6281 Muscle weakness (generalized): Secondary | ICD-10-CM

## 2020-01-29 DIAGNOSIS — G8929 Other chronic pain: Secondary | ICD-10-CM

## 2020-01-29 DIAGNOSIS — R262 Difficulty in walking, not elsewhere classified: Secondary | ICD-10-CM

## 2020-01-29 DIAGNOSIS — R2689 Other abnormalities of gait and mobility: Secondary | ICD-10-CM

## 2020-01-29 DIAGNOSIS — M25562 Pain in left knee: Secondary | ICD-10-CM

## 2020-01-29 NOTE — Therapy (Signed)
[place], [place], [address] Phone: [phone]   Fax:  [phone]  Physical Therapy Treatment  Patient Details  Name: Elaine Burke MRN: [id number] Date of Birth: 1950-04-14 Referring Provider (PT): Dr Hinda Lenis    Encounter Date: 01/29/2020   PT End of Session - 01/29/20 1439    Visit Number 25    Number of Visits 28    Date for PT Re-Evaluation 02/07/20    Authorization Type progress note perfromed on visit 21    PT Start Time 1330    PT Stop Time 1412    PT Time Calculation (min) 42 min    Activity Tolerance Patient tolerated treatment well    Behavior During Therapy [facility] for tasks assessed/performed           Past Medical History:  Diagnosis Date  . Arthritis   . Arthritis   . Diabetes mellitus without complication ([place])   . HLD (hyperlipidemia)   . Hypertension     Past Surgical History:  Procedure Laterality Date  . ABDOMINAL HYSTERECTOMY    . CESAREAN SECTION    . COLONOSCOPY      There were no vitals filed for this visit.   Subjective Assessment - 01/29/20 1406    Subjective Patients knee is doing well today. She has no    Pertinent History Bilateral knee pain,    Limitations Standing;Walking    How long can you stand comfortably? can not stand up very long    Diagnostic tests Brain MRI: enlargement of the pituitary gland without mass effect on the optic nerve    Patient Stated Goals better balance, be able to walk further    Currently in Pain? No/denies                             [facility] Adult PT Treatment/Exercise - 01/29/20 0001      Neuro Re-ed    Neuro Re-ed Details  narrow base on air-ex 2x 60 sec holds; eyes closed 2x560 second holds; step over hurdles forward and back 20x each direction; step onto air-ex x10       Knee/Hip Exercises: Standing   Heel Raises 20 reps    Hip Flexion 1 set;15 reps;Left;Right    Hip Flexion  Limitations slow march x20  4lbs     Hip Abduction 2 sets;10 reps    Abduction Limitations 3lbs     Hip Extension 2 sets;10 reps    Extension Limitations 2lb     Functional Squat 2 sets;10 reps                  PT Education - 01/29/20 1439    Education Details reviewed how to progress exercises at home    Person(s) Educated Patient    Methods Explanation;Tactile cues;Verbal cues;Demonstration    Comprehension Verbalized understanding;Verbal cues required;Tactile cues required;Returned demonstration            PT Short Term Goals - 10/04/19 1605      PT SHORT TERM GOAL #1   Title Patient will increase gros bilateral LE strength to 4+/5    Baseline 5/5 gross     Time 3    Period Weeks    Status On-going    Target Date 09/06/19      PT SHORT TERM GOAL #2   Title Patient wil transfer sit to stand without the use of her hands  Baseline independnet with intial HEP     Time 3    Period Weeks    Target Date 09/06/19      PT SHORT TERM GOAL #3   Title Patient will ambualte 200' with LRAD without right knee hyper extension    Baseline worked on gait with cane    Time 3    Period Weeks    Status On-going    Target Date 09/06/19             PT Long Term Goals - 08/16/19 1551      PT LONG TERM GOAL #1   Title Patient will go up/down 8 steps without pain     Time 6    Period Weeks    Status New    Target Date 09/27/19      PT LONG TERM GOAL #2   Title Patient will ambalte 2000' with LRAD without antalgic gait or decreased balance    Time 6    Period Weeks    Status New    Target Date 09/27/19      PT LONG TERM GOAL #3   Title Patient will increase BERG score to 45 to decrease falls    Time 6    Period Weeks    Status New    Target Date 09/27/19                 Plan - 01/29/20 1440    Clinical Impression Statement The  patient tolerated treatment well. We are progressing patient towards home program. She had no knee pain. She did well with  standing hurdle walks. Expected discharge next visit.    Personal Factors and Comorbidities Comorbidity 1;Comorbidity 2    Comorbidities bilateral knee OA, DMII    Examination-Activity Limitations Sit;Transfers;Squat;Lift;Locomotion Level;Carry;Stand    Examination-Participation Restrictions Shop;Laundry;Cleaning;Driving    Stability/Clinical Decision Making Evolving/Moderate complexity    Clinical Decision Making Moderate    Rehab Potential Good    PT Frequency 1x / week    PT Duration 6 weeks    PT Treatment/Interventions ADLs/Self Care Home Management;Cryotherapy;Electrical Stimulation;Moist Heat;Functional mobility training;Therapeutic activities;Therapeutic exercise;Patient/family education;Balance training;Neuromuscular re-education;Manual techniques;Passive range of motion;Taping    PT Next Visit Plan continue to advance weights and difficulty of ther-ex as tolerated.    PT Home Exercise Plan Access Code: [email or web address] Long Arc Quad (90-45 Degree Range) - 2 x daily - 7 x weekly - 3 sets - 10 reps.Seated Hip Abduction with Resistance - 2 x daily - 7 x weekly - 3 sets - 10 reps.Seated March - 2 x daily - 7 x weekly - 3 sets - 10 reps           Patient will benefit from skilled therapeutic intervention in order to improve the following deficits and impairments:  Abnormal gait, Pain, Decreased activity tolerance, Decreased safety awareness, Decreased range of motion, Increased muscle spasms, Difficulty walking, Decreased endurance, Impaired perceived functional ability, Increased fascial restricitons, Decreased strength  Visit Diagnosis: Other abnormalities of gait and mobility  Difficulty in walking, not elsewhere classified  Muscle weakness (generalized)  Chronic pain of left knee  Chronic pain of right knee     Problem List Patient Active Problem List   Diagnosis Date Noted  . Hypercalcemia 07/20/2019  . Pituitary macroadenoma ([place]) 07/18/2019    Carney Living PT DPT  01/29/2020, 2:49 PM  [facility] [address] [place], [place], [address] Phone: [phone]   Fax:  [phone]  Name: Elaine Burke MRN: [id number] Date of Birth: Aug 15, 1950

## 2020-01-29 NOTE — Therapy (Deleted)
[place], [place], [address] Phone: [phone]   Fax:  [phone]  January 29, 2020   No Recipients  Physical Therapy Discharge Summary  Patient: Elaine Burke  MRN: [id number]  Date of Birth: [birth year]   Diagnosis: Other abnormalities of gait and mobility  Difficulty in walking, not elsewhere classified  Muscle weakness (generalized)  Chronic pain of left knee  Chronic pain of right knee Referring Provider (PT): Dr Hinda Lenis    The above patient had been seen in Physical Therapy *** times of *** treatments scheduled with *** no shows and *** cancellations.  The treatment consisted of *** The patient is: {improved/worse/unchanged:[id number]}  Subjective: ***  Discharge Findings: ***  Functional Status at Discharge: ***  {RRNHA:[id number]}   Plan - 01/29/20 1440    Clinical Impression Statement The  patient tolerated treatment well. We are progressing patient towards home program. She had no knee pain. She did well with standing hurdle walks. Expected discharge next visit.    Personal Factors and Comorbidities Comorbidity 1;Comorbidity 2    Comorbidities bilateral knee OA, DMII    Examination-Activity Limitations Sit;Transfers;Squat;Lift;Locomotion Level;Carry;Stand    Examination-Participation Restrictions Shop;Laundry;Cleaning;Driving    Stability/Clinical Decision Making Evolving/Moderate complexity    Clinical Decision Making Moderate    Rehab Potential Good    PT Frequency 1x / week    PT Duration 6 weeks    PT Treatment/Interventions ADLs/Self Care Home Management;Cryotherapy;Electrical Stimulation;Moist Heat;Functional mobility training;Therapeutic activities;Therapeutic exercise;Patient/family education;Balance training;Neuromuscular re-education;Manual techniques;Passive range of motion;Taping    PT Next Visit Plan continue to advance weights and difficulty of ther-ex  as tolerated.    PT Home Exercise Plan Access Code: [email or web address] Long Arc Quad (90-45 Degree Range) - 2 x daily - 7 x weekly - 3 sets - 10 reps.Seated Hip Abduction with Resistance - 2 x daily - 7 x weekly - 3 sets - 10 reps.Seated March - 2 x daily - 7 x weekly - 3 sets - 10 reps           Sincerely,   Carney Living, PT   CC No Recipients  [facility] [address] [place], [place], [address] Phone: [phone]   Fax:  [phone]  Patient: Elaine Burke  MRN: [id number]  Date of Birth: 1950/04/15

## 2020-02-05 ENCOUNTER — Other Ambulatory Visit: Payer: Self-pay

## 2020-02-05 ENCOUNTER — Ambulatory Visit: Payer: Medicare Other | Admitting: Physical Therapy

## 2020-02-05 ENCOUNTER — Encounter: Payer: Self-pay | Admitting: Physical Therapy

## 2020-02-05 DIAGNOSIS — R262 Difficulty in walking, not elsewhere classified: Secondary | ICD-10-CM

## 2020-02-05 DIAGNOSIS — R2689 Other abnormalities of gait and mobility: Secondary | ICD-10-CM

## 2020-02-05 DIAGNOSIS — M25562 Pain in left knee: Secondary | ICD-10-CM

## 2020-02-05 DIAGNOSIS — M6281 Muscle weakness (generalized): Secondary | ICD-10-CM

## 2020-02-05 DIAGNOSIS — G8929 Other chronic pain: Secondary | ICD-10-CM

## 2020-02-05 NOTE — Therapy (Signed)
[place], [place], [address] Phone: [phone]   Fax:  [phone]  Physical Therapy Treatment/ Discharge   Patient Details  Name: Elaine Burke MRN: [id number] Date of Birth: [birth year] Referring Provider (PT): Dr Hinda Lenis    Encounter Date: 02/05/2020   PT End of Session - 02/05/20 1549    Visit Number 26    Number of Visits 28    Date for PT Re-Evaluation 02/07/20    Authorization Type progress note perfromed on visit 21    PT Start Time 1330    PT Stop Time 1410    PT Time Calculation (min) 40 min    Activity Tolerance Patient tolerated treatment well    Behavior During Therapy [facility] for tasks assessed/performed           Past Medical History:  Diagnosis Date   Arthritis    Arthritis    Diabetes mellitus without complication ([place])    HLD (hyperlipidemia)    Hypertension     Past Surgical History:  Procedure Laterality Date   ABDOMINAL HYSTERECTOMY     CESAREAN SECTION     COLONOSCOPY      There were no vitals filed for this visit.   Subjective Assessment - 02/05/20 1548    Subjective Patient has no complaints. She reports her knees did well over the weekend.    Pertinent History Bilateral knee pain,    Limitations Standing;Walking    How long can you stand comfortably? can not stand up very long    How long can you walk comfortably? can not walk very far    Diagnostic tests Brain MRI: enlargement of the pituitary gland without mass effect on the optic nerve    Patient Stated Goals better balance, be able to walk further    Currently in Pain? No/denies                             [facility] Adult PT Treatment/Exercise - 02/05/20 0001      Self-Care   Self-Care Other Self-Care Comments    Other Self-Care Comments  reviewded how to progress her exercise sand how to gaurd a t home using the sink. Reviwed where to buy a ballance mat        Neuro Re-ed    Neuro Re-ed Details  narrow base on air-ex 2x 60 sec holds; eyes closed 2x560 second holds; step over hurdles forward and back 20x each direction; step onto air-ex x10       Knee/Hip Exercises: Standing   Heel Raises 20 reps    Hip Flexion 1 set;15 reps;Left;Right    Hip Flexion Limitations slow march x20  4lbs     Hip Abduction 2 sets;10 reps    Abduction Limitations 3lbs     Hip Extension 2 sets;10 reps    Extension Limitations 2lb     Functional Squat 2 sets;10 reps      Knee/Hip Exercises: Supine   Bridges 3 sets;10 reps    Bridges Limitations x20     Straight Leg Raises 1 set;15 reps;Right;Left    Straight Leg Raises Limitations x15 bilateral     Other Supine Knee/Hip Exercises supine march 1lb weight 2x10 ; double knee to ches with ball 2x10     Other Supine Knee/Hip Exercises Clam shells g blue  band x20  PT Education - 02/05/20 1549    Education Details HEP and symptom management    Person(s) Educated Patient    Methods Explanation;Demonstration;Verbal cues;Tactile cues    Comprehension Verbalized understanding;Returned demonstration;Verbal cues required;Tactile cues required            PT Short Term Goals - 02/05/20 1554      PT SHORT TERM GOAL #1   Title Patient will increase gros bilateral LE strength to 4+/5    Baseline 5/5 gross     Time 3    Period Weeks    Status Achieved    Target Date 09/06/19      PT SHORT TERM GOAL #2   Title Patient wil transfer sit to stand without the use of her hands    Time 3    Period Weeks    Status Achieved      PT SHORT TERM GOAL #3   Title Patient will ambualte 200' with LRAD without right knee hyper extension    Baseline no hyper extension. Still using a walker    Time 3    Period Weeks    Status Achieved    Target Date 09/06/19             PT Long Term Goals - 02/05/20 1554      PT LONG TERM GOAL #1   Title Patient will go up/down 8 steps without pain     Baseline  able to go up and down without difficulty     Time 6    Period Weeks    Status Not Met      PT LONG TERM GOAL #2   Title Patient will ambalte 2000' with LRAD without antalgic gait or decreased balance    Baseline can not stand an hour    Time 6    Period Weeks    Status Not Met      PT LONG TERM GOAL #3   Title Patient will increase BERG score to 45 to decrease falls    Baseline not tested    Time 6    Period Weeks    Status Achieved                 Plan - 02/05/20 1551    Clinical Impression Statement Patient has reached max benefit from therapy. Therapy reviewed complete HE. She was given mat strengthening, seated strengthening, and reviewed balance and coordination exercises. She feels comfortabel with her exercises. She still has to use a walker vform primary mobility.    Personal Factors and Comorbidities Comorbidity 1;Comorbidity 2    Comorbidities bilateral knee OA, DMII    Examination-Activity Limitations Sit;Transfers;Squat;Lift;Locomotion Level;Carry;Stand    Examination-Participation Restrictions Shop;Laundry;Cleaning;Driving    Stability/Clinical Decision Making Evolving/Moderate complexity    Clinical Decision Making Moderate    Rehab Potential Good    PT Frequency 1x / week    PT Duration 6 weeks    PT Treatment/Interventions ADLs/Self Care Home Management;Cryotherapy;Electrical Stimulation;Moist Heat;Functional mobility training;Therapeutic activities;Therapeutic exercise;Patient/family education;Balance training;Neuromuscular re-education;Manual techniques;Passive range of motion;Taping    PT Next Visit Plan continue to advance weights and difficulty of ther-ex as tolerated.    PT Home Exercise Plan Access Code: [email or web address] Long Arc Quad (90-45 Degree Range) - 2 x daily - 7 x weekly - 3 sets - 10 repsSeated Hip Abduction with Resistance - 2 x daily - 7 x weekly - 3 sets - 10 repsSeated March - 2 x daily - 7 x weekly - 3 sets -  10 reps     Consulted and Agree with Plan of Care Patient           Patient will benefit from skilled therapeutic intervention in order to improve the following deficits and impairments:  Abnormal gait, Pain, Decreased activity tolerance, Decreased safety awareness, Decreased range of motion, Increased muscle spasms, Difficulty walking, Decreased endurance, Impaired perceived functional ability, Increased fascial restricitons, Decreased strength  Visit Diagnosis: Other abnormalities of gait and mobility  Difficulty in walking, not elsewhere classified  Muscle weakness (generalized)  Chronic pain of left knee  Chronic pain of right knee    PHYSICAL THERAPY DISCHARGE SUMMARY  Visits from Start of Care: 26   Current functional level related to goals / functional outcomes: Improved endyurabce with ADL's   Remaining deficits: Continues to require a walker   Education / Equipment: HEP   Plan: Patient agrees to discharge.  Patient goals were partially met. Patient is being discharged due to lack of progress.  ?????      Problem List Patient Active Problem List   Diagnosis Date Noted   Hypercalcemia 07/20/2019   Pituitary macroadenoma ([place]) 07/18/2019    Carney Living  PT DPT  02/05/2020, 3:59 PM  [facility] [address] [place], [place], [address] Phone: [phone]   Fax:  [phone]  Name: Teja Costen MRN: [id number] Date of Birth: Sep 14, 1950

## 2020-04-22 ENCOUNTER — Ambulatory Visit (INDEPENDENT_AMBULATORY_CARE_PROVIDER_SITE_OTHER): Payer: Medicare Other | Admitting: Neurology

## 2020-04-22 ENCOUNTER — Telehealth: Payer: Self-pay | Admitting: Neurology

## 2020-04-22 ENCOUNTER — Encounter: Payer: Self-pay | Admitting: Neurology

## 2020-04-22 VITALS — BP 146/96 | HR 116 | Ht 63.0 in | Wt 187.0 lb

## 2020-04-22 DIAGNOSIS — R937 Abnormal findings on diagnostic imaging of other parts of musculoskeletal system: Secondary | ICD-10-CM

## 2020-04-22 DIAGNOSIS — R279 Unspecified lack of coordination: Secondary | ICD-10-CM | POA: Diagnosis not present

## 2020-04-22 DIAGNOSIS — R269 Unspecified abnormalities of gait and mobility: Secondary | ICD-10-CM

## 2020-04-22 DIAGNOSIS — R2689 Other abnormalities of gait and mobility: Secondary | ICD-10-CM

## 2020-04-22 DIAGNOSIS — D352 Benign neoplasm of pituitary gland: Secondary | ICD-10-CM

## 2020-04-22 NOTE — Patient Instructions (Signed)
It was good to see you again today.  Unfortunately, still do not have a single unifying diagnosis to explain your incoordination and balance issues.  You do have a pituitary adenoma as you know and it needs to be followed.  Please talk to Dr. Zada Finders about repeating your brain MRI sooner than later.  He will likely order a brain MRI with and without contrast, last brain MRI did not show any lesions to suspect multiple sclerosis for example. I think it is important that you follow with endocrinology, you have an appointment coming up in early March.  You have already seen ophthalmology and have had a formal eye exam. Dr. Lynann Bologna did a spinal MRI of the neck, mid back and lower back.  There was one spot in the T1-2 region that showed up on your spinal cord in that area, I think it needs follow-up.  I will order a cervical and thoracic spine MRI to be repeated with contrast. We will repeat some blood work that we did last year.  1 test showed increased antibodies in your bloodstream and I think we need to repeat this.  This is the test we were talking about earlier in the visit that was mildly abnormal at the time. We will keep you posted as to your test results by phone call.

## 2020-04-22 NOTE — Progress Notes (Signed)
Subjective:    Patient ID: Elaine Burke is a 70 y.o. female.  HPI     Star Age, MD, PhD [facility] [address] [place], [place] [address]  Dear Dr. Lynann Bologna,   I saw your patient, Elaine Burke, upon your kind request in my neurologic clinic today for initial consultation of her neck and back pain. The patient is unaccompanied today. As you know, [name] is a 70 year old right-handed woman with an underlying medical history of arthritis, diabetes, pituitary adenoma, hypertension, hyperlipidemia, gait disorder, and obesity, who reports ongoing issues with her gait and balance.  She has been using a walker.  She has not fallen.  She denies any significant neck or back pain.  She has not had any radiating pain in the upper mid back or lower back.  She has had physical therapy for several months with modest improvement in her gait and balance.  She denies any pain in her limbs.  She reports that she saw Dr. Venetia Constable last year in May and is supposed to go back in 1 year.  She has established care with Dr. Renato Shin and has a follow-up appointment pending for early next month.  She had seen Dr. Katy Fitch for her eye examination.  She had extensive blood work through endocrinology and we also did extensive blood work in April 2021 when I first met her.  We talked about her mildly abnormal protein electrophoresis before.  She had been advised that there was a nonspecific increase in her immunoglobulins.  Her IgG was 1865, IgA mildly elevated at 373.  She was advised to follow-up with her primary care physician and consider repeating her protein electrophoresis in 2 to 3 months.  The patient reports that she followed up with her primary care physician but does not recall having a repeat protein electrophoresis done.  I was able to review Dr. Colleen Can office note from 07/20/2018.  He saw no clinical signs of myelopathy.  He did not  suggest any surgical resection at the time and recommended 1 year checkup with repeat brain MRI, pituitary protocol.  I have previously evaluated her for gait disorder and also for evaluation of her pituitary adenoma.  She had extensive work-up in 2021 including blood work, neuroimaging, and EMG nerve conduction velocity testing.   I recommended evaluation with a spine specialist.  She had also seen Dr. Alvan Dame.  Her primary care physician, Dr. Ernie Hew ordered a brain MRI.  Her brain MRI revealed a pituitary adenoma. She was advised to seek consultation with endocrinology and neurosurgery.  I had placed referrals in May 2021.  I reviewed your office note from 02/23/2020.  She had MRI scanning of the cervical, thoracic spine through your office.  MRI of the cervical spine without contrast from 02/17/2020; impression: Multifocal T2 hyperintense cervical thoracic cord lesions are nonspecific.  Differential includes myelomalacia, demyelination and alternative causes of myelopathy.  Consider postcontrast MRI for further evaluation.  Dedicated MRI of the thoracic spine with and without contrast is also recommended.  Multilevel spondylosis as detailed above.  MRI of the lumbar spine without contrast from 02/17/2020 showed: Impression: No significant spinal canal or neural foraminal stenosis at any level.  Moderate facet degenerative changes at L4-5 and L5-S1.  MRI of the thoracic spine without contrast on 02/20/2020 showed: Small focus of increased T2 signal in the right hemicord at T1-T2, possibly demyelinating disease.  Consider MRI of the brain with and without contrast for further  evaluation.  Mild degenerative disc disease throughout the thoracic spine without stenosis or impingement.  Trace bilateral pleural effusions.  Previously:   07/13/19: 70 year old right-handed woman with an underlying medical history of prediabetes, hypertension, allergies and obesity, who Presents for evaluation of her pituitary  adenoma. She is here with her daughter. She is referred by her primary care physician, Dr. Ernie Hew for the brain tumor evaluation.  I have previously seen the patient recently on 06/08/2019 for Her balance issues.  We did work-up for neuropathy with EMG nerve conduction testing which was benign, blood work was largely benign, she had mild abnormality in her protein electrophoresis.  She was advised to follow-up with her primary care physician.  She had a recent brain MRI with and without contrast which showed an enlarged pituitary gland and he was advised to repeat her MRI with special attention to the pituitary gland.  She had a repeat MRI with and without contrast to evaluate for pituitary adenoma, she had this on 06/26/2019 and I reviewed the results: IMPRESSION: 1. Diffusely enlarged and heterogeneously enhancing pituitary gland favored to reflect a 1.5 cm macroadenoma. Mild suprasellar extension without mass effect on the optic apparatus. 2. Otherwise unchanged and largely unremarkable appearance of the brain for age.    06/08/19: (She) reports a 1+ year history of difficulty walking.  She feels off balance at times, sometimes she feels like she staggers, sometimes she feels weaker in the legs.  She denies any pain, numbness or tingling, radiating back pain or mid back pain, no burning sensation in the feet, denies sudden onset of one-sided weakness or numbness or tingling or droopy face or slurring of speech.  She has seen Dr. Alvan Dame because of her symptoms.  She has a history of bilateral knee pain and arthritis.  Dr. Alvan Dame did not see any obvious orthopedic cause of her gait disorder.  She is scheduled for a brain MRI as ordered by you tomorrow.  She had some blood work through your office recently.  I was not able to review the blood test results.  I reviewed your office note. She denies a family history of neuromuscular diseases such as neuropathy or muscular diseases.  She has not fallen.  Sometimes she  holds onto things at home when she walks.  She has had physical therapy twice, last time over a year ago.  She has not been advised to start using a cane or a walker. She denies bowel or bladder dysfunction.  She does not hydrate very well, estimates that she drinks about 2 to 3 cups of water per day, 1 glass of tea per day, no coffee, 1 small soda can, 8 ounce.  She does not smoke, she does not drink any alcohol.  She has no history of alcohol use disorder.  Her Past Medical History Is Significant For: Past Medical History:  Diagnosis Date  . Arthritis   . Arthritis   . Diabetes mellitus without complication ([place])   . HLD (hyperlipidemia)   . Hypertension     Her Past Surgical History Is Significant For: Past Surgical History:  Procedure Laterality Date  . ABDOMINAL HYSTERECTOMY    . CESAREAN SECTION    . COLONOSCOPY      Her Family History Is Significant For: Family History  Problem Relation Age of Onset  . Breast cancer Paternal Aunt   . Other Neg Hx        pituitary abnormality    Her Social History Is Significant For: Social  History   Socioeconomic History  . Marital status: Married    Spouse name: Not on file  . Number of children: Not on file  . Years of education: Not on file  . Highest education level: Not on file  Occupational History  . Not on file  Tobacco Use  . Smoking status: Never Smoker  . Smokeless tobacco: Never Used  Substance and Sexual Activity  . Alcohol use: No  . Drug use: No  . Sexual activity: Not on file  Other Topics Concern  . Not on file  Social History Narrative  . Not on file   Social Determinants of Health   Financial Resource Strain: Not on file  Food Insecurity: Not on file  Transportation Needs: Not on file  Physical Activity: Not on file  Stress: Not on file  Social Connections: Not on file    Her Allergies Are:  Allergies  Allergen Reactions  . Shellfish Allergy Swelling  . Shellfish Allergy Swelling  .  Lisinopril Other (See Comments)    MD said affects kidneys. No take   . Penicillins Hives, Itching and Swelling    Has patient had a PCN reaction causing immediate rash, facial/tongue/throat swelling, SOB or lightheadedness with hypotension: No Has patient had a PCN reaction causing severe rash involving mucus membranes or skin necrosis: No Has patient had a PCN reaction that required hospitalization: No Has patient had a PCN reaction occurring within the last 10 years: No If all of the above answers are "NO", then may proceed with Cephalosporin use.   :   Her Current Medications Are:  Outpatient Encounter Medications as of 04/22/2020  Medication Sig  . amLODipine (NORVASC) 10 MG tablet Take 1 tablet by mouth daily.  Marland Kitchen CALCIUM PO Take by mouth.  . loratadine (CLARITIN) 10 MG tablet Take 10 mg by mouth daily.  . Multiple Vitamins-Minerals (MULTIVITAL PO) Take by mouth.  . potassium chloride (KLOR-CON) 10 MEQ tablet Take 10 mEq by mouth 2 (two) times daily.  . rosuvastatin (CRESTOR) 5 MG tablet Take 5 mg by mouth daily.  . valsartan-hydrochlorothiazide (DIOVAN-HCT) 320-25 MG tablet Take 1 tablet by mouth daily.  Marland Kitchen VITAMIN D PO Take 5,000 Units by mouth.   No facility-administered encounter medications on file as of 04/22/2020.  :  Review of Systems:  Out of a complete 14 point review of systems, all are reviewed and negative with the exception of these symptoms as listed below:  Review of Systems  Neurological:       Here for consult on worsening balance and lack of coordination. Denies any recent fall. Has tried PT had some benefit but not much. Pt reports she was evaluated neuro surgery in May of 2021, she reports she was told to f/u in a year. Pt reports she was evaluated by Dr. Zada Finders.    Objective:  Neurological Exam  Physical Exam Physical Examination:   Vitals:   04/22/20 1259  BP: (!) 146/96  Pulse: (!) 116  SpO2: 99%    General Examination: The patient is a very  pleasant 70 y.o. female in no acute distress. She appears well-developed and well-nourished and well groomed.   HEENT:Normocephalic, atraumatic, pupils are equal, round and reactive to light, extraocular tracking is preserved, corrective eyeglasses in place.  Hearing is grossly intact.  Face is symmetric with normal facial animation.  Speech is clear without dysarthria, hypophonia, voice tremor.  Neck is supple with full range of motion, no carotid bruits.  Airway examination reveals  moderate mouth dryness, otherwise stable findings, tongue protrudes centrally in palate elevates symmetrically.   Chest:Clear to auscultation without wheezing, rhonchi or crackles noted.  Heart:S1+S2+0, regular and normal without murmurs, rubs or gallops noted.   Abdomen:Soft, non-tender and non-distended.   Extremities:There isno pitting edema.  Skin: Warm and dry without trophic changes noted.  Musculoskeletal: exam revealsmild knee discomfort bilaterally.     Neurologically:  Mental status: The patient is awake, alert and oriented in all 4 spheres.Herimmediate and remote memory, attention, language skills and fund of knowledge are appropriate. There is no evidence of aphasia, agnosia, apraxia or anomia. Speech is clear with normal prosody and enunciation. Thought process is linear. Mood is normaland affect is normal.  Cranial nerves II - XII are as described above under HEENT exam.  Motor exam: Normal bulk, strength and tone is noted. There is no drift, tremor or rebound.  In particular, no postural or action tremor, no resting tremor.  Fine motor skills and coordination:  Intact with finger taps, hand movements, rapid alternating patting.  Foot taps and foot agility symmetrical bilaterally.  No foot drop.   Cerebellar testing: No dysmetria or intention tremor.  Finger-to-nose without dysmetria, heel-to-shin without problems.  Sensory exam: intact to light touchinthe upper and lower extremities.   Gait, station and balance:Shestands stands with mild difficulty and stands slightly wide-based.  She has a rolling walker but is able to walk some without her walker.  She walks slightly wide-based and turns in securely.  No steppage, no circumduction, no shuffling or magnetic gait, appears to be nonspecific and insecure.  Does not bend her knees naturally.  Assessmentand plan:   In summary,Elaine Margaretha Sheffield Allen-Smithis a very pleasant 32 year oldfemalewith an underlying medical history of prediabetes, hypertension, allergies and obesity, whopresents for evaluation of  her neck and back pain.  She currently denies any significant neck or back pain.  She has a history of pituitary macroadenoma which was found in April 2021 via brain MRI.  She has seen neurosurgery and is scheduled for follow-up MRI in May.  She is advised to talk to her neurosurgeon about repeating her brain MRI with and without contrast at this time.  She had an abnormality on the thoracic spine MRI which was done through your office.  She was found to have a T2 lesion at T1-T2, overall nonspecific. I did not have the actual images but would like to repeat her C-spine and thoracic spine MRI with and without contrast to make sure there are no abnormalities in terms of cord lesions.  I am still not quite sure how to tie in her symptoms altogether.  She had extensive work-up through our office including blood work.  She has seen endocrinology and ophthalmology and has had physical therapy as well.  Back in April 2021 as part of extensive blood work through our office, she was found to have an abnormal protein electrophoresis, no monoclonal gammopathy was found but a nonspecific/polyclonal increase in her IgG and IgA.  She was advised to consider repeating this with her PCP but she is not sure that this was actually done.  Nevertheless, she is advised to have repeat blood tests done through our office, since she is here, would like to add  some repeat blood tests today.  We will go ahead with a request for a MRI of the cervical and T-spine with contrast.  Previously, CK level was normal.  Exam shows a nonspecific gait abnormality. Her previous brain MRI did not show  any demyelinating lesions or abnormal contrast uptake other than the pituitary macroadenoma for which she has established with neurosurgery.  I will copy Dr. Ernie Hew on my note as well as Dr. Zada Finders for completion on my note today. I spent 40- minutes in total face-to-face time and in reviewing records during pre-charting, more than 50% of which was spent in counseling and coordination of care, reviewing test results, reviewing medications and treatment regimen and/or in discussing or reviewing the diagnosis of gait/balance d/o, the prognosis and treatment options. Pertinent laboratory and imaging test results that were available during this visit with the patient were reviewed by me and considered in my medical decision making (see chart for details).

## 2020-04-22 NOTE — Telephone Encounter (Signed)
[employer] medicare order sent to GI. No auth they will reach out to the patient to schedule.  

## 2020-04-24 ENCOUNTER — Other Ambulatory Visit: Payer: Self-pay

## 2020-04-24 ENCOUNTER — Ambulatory Visit
Admission: RE | Admit: 2020-04-24 | Discharge: 2020-04-24 | Disposition: A | Discharge status: Home / Self Care | Payer: Medicare Other | Source: Ambulatory Visit | Attending: Neurology | Admitting: Neurology

## 2020-04-24 DIAGNOSIS — R937 Abnormal findings on diagnostic imaging of other parts of musculoskeletal system: Secondary | ICD-10-CM

## 2020-04-24 DIAGNOSIS — R2689 Other abnormalities of gait and mobility: Secondary | ICD-10-CM

## 2020-04-24 DIAGNOSIS — R279 Unspecified lack of coordination: Secondary | ICD-10-CM

## 2020-04-24 DIAGNOSIS — D352 Benign neoplasm of pituitary gland: Secondary | ICD-10-CM

## 2020-04-24 DIAGNOSIS — R269 Unspecified abnormalities of gait and mobility: Secondary | ICD-10-CM

## 2020-04-24 MED ORDER — GADOBENATE DIMEGLUMINE 529 MG/ML IV SOLN
17.0000 mL | Freq: Once | INTRAVENOUS | Status: AC | PRN
  Administered 2020-04-24: 17 mL via INTRAVENOUS

## 2020-04-25 NOTE — Progress Notes (Signed)
Please call patient and advise her that her blood test results are partially back.  As discussed previously, she does have elevated protein in her blood and an elevated antibodies which is a nonspecific finding but numbers have increased.  I think we still need to proceed with the MRI neck and thoracic spine with and without contrast still, but I would like to get input from a hematologist at this point.  I am not quite sure how this is all tied together.  Her A1c was 6.9 which indicates fairly good blood sugar control.  Vitamin B1 and vitamin B6 are still pending, we will update with the rest of the blood if anything else comes back abnormal.  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

## 2020-04-29 ENCOUNTER — Telehealth: Payer: Self-pay

## 2020-04-29 ENCOUNTER — Ambulatory Visit
Admission: RE | Admit: 2020-04-29 | Discharge: 2020-04-29 | Disposition: A | Discharge status: Home / Self Care | Payer: Medicare Other | Source: Ambulatory Visit | Attending: Neurology | Admitting: Neurology

## 2020-04-29 ENCOUNTER — Other Ambulatory Visit: Payer: Self-pay

## 2020-04-29 DIAGNOSIS — R937 Abnormal findings on diagnostic imaging of other parts of musculoskeletal system: Secondary | ICD-10-CM

## 2020-04-29 DIAGNOSIS — R269 Unspecified abnormalities of gait and mobility: Secondary | ICD-10-CM

## 2020-04-29 DIAGNOSIS — D890 Polyclonal hypergammaglobulinemia: Secondary | ICD-10-CM

## 2020-04-29 DIAGNOSIS — R2689 Other abnormalities of gait and mobility: Secondary | ICD-10-CM

## 2020-04-29 DIAGNOSIS — D352 Benign neoplasm of pituitary gland: Secondary | ICD-10-CM

## 2020-04-29 DIAGNOSIS — R279 Unspecified lack of coordination: Secondary | ICD-10-CM

## 2020-04-29 MED ORDER — GADOBENATE DIMEGLUMINE 529 MG/ML IV SOLN
17.0000 mL | Freq: Once | INTRAVENOUS | Status: AC | PRN
  Administered 2020-04-29: 17 mL via INTRAVENOUS

## 2020-04-29 NOTE — Progress Notes (Signed)
Please call patient and advise her that both her cervical spine, ie neck and her thoracic spine, ie mid-back MRIs with and without contrast showed no significant abnormalities, in particular, no abnormality in the spinal cord, no significant disc disease, no evidence of narrowing of the spinal canal.  Overall milder degenerative changes were seen but nothing that would explain her symptoms or warrant surgical evaluation.

## 2020-04-29 NOTE — Telephone Encounter (Signed)
-----   Message from Star Age, MD sent at 04/25/2020  8:24 AM EST ----- Please call patient and advise her that her blood test results are partially back.  As discussed previously, she does have elevated protein in her blood and an elevated antibodies which is a nonspecific finding but numbers have increased.  I think we still need to proceed with the MRI neck and thoracic spine with and without contrast still, but I would like to get input from a hematologist at this point.  I am not quite sure how this is all tied together.  Her A1c was 6.9 which indicates fairly good blood sugar control.  Vitamin B1 and vitamin B6 are still pending, we will update with the rest of the blood if anything else comes back abnormal.  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

## 2020-04-29 NOTE — Telephone Encounter (Signed)
I contacted the pt and we reviewed her labs. Pt is agreeable to the Hematology referral and I have placed in epic. Pt advised we could call back it the pending vitamin levels came back abnormal.

## 2020-04-29 NOTE — Telephone Encounter (Signed)
-----   Message from Star Age, MD sent at 04/29/2020  4:16 PM EST ----- Please call patient and advise her that both her cervical spine, ie neck and her thoracic spine, ie mid-back MRIs with and without contrast showed no significant abnormalities, in particular, no abnormality in the spinal cord, no significant disc disease, no evidence of narrowing of the spinal canal.  Overall milder degenerative changes were seen but nothing that would explain her symptoms or warrant surgical evaluation.

## 2020-04-29 NOTE — Telephone Encounter (Signed)
I called patient and we reviewed the results of the MRI.  Patient verbalized understanding of results and had no questions/concerns.

## 2020-04-30 ENCOUNTER — Other Ambulatory Visit: Payer: Self-pay | Admitting: Family Medicine

## 2020-04-30 ENCOUNTER — Telehealth: Payer: Self-pay | Admitting: Physician Assistant

## 2020-04-30 DIAGNOSIS — Z1231 Encounter for screening mammogram for malignant neoplasm of breast: Secondary | ICD-10-CM

## 2020-04-30 NOTE — Telephone Encounter (Signed)
Received a new hem referral from Dr. Rexene Alberts at [facility] for polyclonal gammopathy. [name] has been cld and scheduled to see Cassie on 3/2 at 1:30pm w/labs at 1pm. Pt aware to arrive 20 minutes early.

## 2020-05-02 LAB — MULTIPLE MYELOMA PANEL, SERUM
Albumin SerPl Elph-Mcnc: 4 g/dL (ref 2.9–4.4)
Albumin/Glob SerPl: 1 (ref 0.7–1.7)
Alpha 1: 0.2 g/dL (ref 0.0–0.4)
Alpha2 Glob SerPl Elph-Mcnc: 0.8 g/dL (ref 0.4–1.0)
B-Globulin SerPl Elph-Mcnc: 1.4 g/dL — ABNORMAL HIGH (ref 0.7–1.3)
Gamma Glob SerPl Elph-Mcnc: 1.9 g/dL — ABNORMAL HIGH (ref 0.4–1.8)
Globulin, Total: 4.4 g/dL — ABNORMAL HIGH (ref 2.2–3.9)
IgA/Immunoglobulin A, Serum: 439 mg/dL — ABNORMAL HIGH (ref 87–352)
IgG (Immunoglobin G), Serum: 2121 mg/dL — ABNORMAL HIGH (ref 586–1602)
IgM (Immunoglobulin M), Srm: 112 mg/dL (ref 26–217)
Total Protein: 8.4 g/dL (ref 6.0–8.5)

## 2020-05-02 LAB — HEAVY METALS PROFILE II, BLOOD
Arsenic: 2 ug/L (ref 0–9)
Cadmium: 0.6 ug/L (ref 0.0–1.2)
Lead, Blood: <1 ug/dL (ref 0–4)
Mercury: <1 ug/L (ref 0.0–14.9)

## 2020-05-02 LAB — VITAMIN B1: Thiamine: 170.4 nmol/L (ref 66.5–200.0)

## 2020-05-02 LAB — VITAMIN B6: Vitamin B6: 13.3 ug/L (ref 2.0–32.8)

## 2020-05-02 LAB — VITAMIN D 25 HYDROXY (VIT D DEFICIENCY, FRACTURES): Vit D, 25-Hydroxy: 57.7 ng/mL (ref 30.0–100.0)

## 2020-05-02 LAB — TSH: TSH: 2.59 u[IU]/mL (ref 0.450–4.500)

## 2020-05-02 LAB — B12 AND FOLATE PANEL
Folate: 13.3 ng/mL (ref >3.0)
Vitamin B-12: 490 pg/mL (ref 232–1245)

## 2020-05-02 LAB — CK: Total CK: 107 U/L (ref 32–182)

## 2020-05-02 LAB — HGB A1C W/O EAG: Hgb A1c MFr Bld: 6.9 % — ABNORMAL HIGH (ref 4.8–5.6)

## 2020-05-02 LAB — ANA W/REFLEX: Anti Nuclear Antibody (ANA): NEGATIVE

## 2020-05-02 LAB — HTLV I+II ANTIBODIES, (EIA), BLD: HTLV I/II Ab: NEGATIVE

## 2020-05-07 ENCOUNTER — Other Ambulatory Visit: Payer: Self-pay | Admitting: Physician Assistant

## 2020-05-07 DIAGNOSIS — D890 Polyclonal hypergammaglobulinemia: Secondary | ICD-10-CM

## 2020-05-07 NOTE — Progress Notes (Signed)
[place] Telephone:([phone]) [phone]   [phone]) [phone]  CONSULT NOTE  REFERRING PHYSICIAN: Dr. Rexene Alberts  REASON FOR CONSULTATION:  Polyclonal Gammopathy   HPI Pearlean Sabina Allen-Smith is a 70 y.o. female the past medical history for pituitary adenoma, hypertension, hyperlipidemia, diabetes, and arthritis is referred to the clinic for evaluation of polyclonal gammopathy  The patient followed up with her neurologist on 04/22/2020 for the chief complaint of neck and back pain.  The patient reportedly has been having ongoing issues with her gait and balance during the last year. She completed physical therapy but continues to use a walker for support with ambulation. Last year in April 2021, a brain MRI was ordered which showed a pituitary macroadenoma. She has had an extensive work-up with endocrinology as well as neuro-surgery which was unrevealing. She also had some rheumatologic labs performed including an ANA which was negative. She had a CRP performed last year which was norma.. Dr. Zada Finders who determined that she did not have clinical signs of myelopathy.  She also had extensive blood work performed in April 2021 by neurology which included blood work, neuroimaging, and EMG nerve conduction velocity testing. Part of her work-up included an SPEP which did not show an M-spike but showed elevated protein with an IgG of 1865 and IgA mildly elevated at 373. This was recently repeated on 04/22/20 which showed showed some increase in Igg at 2,121, and increase in IgA at 439. There was no M-Spike. It showed polyclonal increase in one or more immunoglobulins. She was subsequently referred to the clinic for further evaluation and recommendations regarding this.   Overall, the patient is feeling well today except for the weakness/gait changes in her legs.  She denies any fever, chills, night sweats, or unexplained weight loss.  She denies any lymphadenopathy.  She denies any abnormal  bleeding or bruising including epistaxis, gingival bleeding, hemoptysis, hematemesis, melena, hematochezia, or hematuria.  She denies any frequent or recent infections.  She denies any peripheral neuropathy.  She denies any abdominal fullness or early satiety.  She denies any visual changes, headaches, vertigo, or tinnitus.  She denies any personal history of any autoimmune disorders.  She denies any abnormal rashes or joint pains.  She denies any bone fractures except for falling and breaking her patella.  The patient currently takes calcium supplements and her calcium has been consistently high on routine blood work.   The patient's family history consists of a mother who had dementia and heart disease.  The patient believes her mother had some form of cancer when the patient was in elementary school but the patient does not know what type.  The patient's father passed away due to motor vehicle accident at the age of 46.  The patient brother has hypertension and diabetes and was on dialysis.  The patient's other brother has hypertension.  The patient's other brother has diabetes and hypertension.  The patient sister has hypertension.  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  The patient is married and has 1 child.  She denies any tobacco, drug, or alcohol use.   HPI  Past Medical History:  Diagnosis Date  . Arthritis   . Arthritis   . Diabetes mellitus without complication ([place])   . HLD (hyperlipidemia)   . Hypertension     Past Surgical History:  Procedure Laterality Date  . ABDOMINAL HYSTERECTOMY    . CESAREAN SECTION    .  COLONOSCOPY      Family History  Problem Relation Age of Onset  . Breast cancer Paternal Aunt   . Other Neg Hx        pituitary abnormality    Social History Social History   Tobacco Use  . Smoking status: Never Smoker  . Smokeless tobacco: Never Used  Substance Use Topics  . Alcohol  use: No  . Drug use: No    Allergies  Allergen Reactions  . Shellfish Allergy Swelling  . Shellfish Allergy Swelling  . Lisinopril Other (See Comments)    MD said affects kidneys. No take   . Penicillins Hives, Itching and Swelling    Has patient had a PCN reaction causing immediate rash, facial/tongue/throat swelling, SOB or lightheadedness with hypotension: No Has patient had a PCN reaction causing severe rash involving mucus membranes or skin necrosis: No Has patient had a PCN reaction that required hospitalization: No Has patient had a PCN reaction occurring within the last 10 years: No If all of the above answers are "NO", then may proceed with Cephalosporin use.     Current Outpatient Medications  Medication Sig Dispense Refill  . amLODipine (NORVASC) 10 MG tablet Take 1 tablet by mouth daily.  5  . CALCIUM PO Take by mouth.    . loratadine (CLARITIN) 10 MG tablet Take 10 mg by mouth daily.    . Multiple Vitamins-Minerals (MULTIVITAL PO) Take by mouth.    . potassium chloride (KLOR-CON) 10 MEQ tablet Take 10 mEq by mouth 2 (two) times daily.    . rosuvastatin (CRESTOR) 5 MG tablet Take 5 mg by mouth daily.    . valsartan-hydrochlorothiazide (DIOVAN-HCT) 320-25 MG tablet Take 1 tablet by mouth daily.    Marland Kitchen VITAMIN D PO Take 5,000 Units by mouth.     No current facility-administered medications for this visit.    REVIEW OF SYSTEMS:   Review of Systems  Constitutional: Negative for appetite change, chills, fatigue, fever and unexpected weight change.  HENT: Negative for mouth sores, nosebleeds, sore throat and trouble swallowing.   Eyes: Negative for eye problems and icterus.  Respiratory: Negative for cough, hemoptysis, shortness of breath and wheezing.   Cardiovascular: Negative for chest pain and leg swelling.  Gastrointestinal: Negative for abdominal pain, constipation, diarrhea, nausea and vomiting.  Genitourinary: Negative for bladder incontinence, difficulty  urinating, dysuria, frequency and hematuria.   Musculoskeletal: Negative for back pain, gait problem, neck pain and neck stiffness.  Skin: Negative for itching and rash.  Neurological: Positive for gait changes. Negative for dizziness, extremity weakness, headaches, light-headedness and seizures.  Hematological: Negative for adenopathy. Does not bruise/bleed easily.  Psychiatric/Behavioral: Negative for confusion, depression and sleep disturbance. The patient is not nervous/anxious.     PHYSICAL EXAMINATION:  Blood pressure 132/90, pulse (!) 105, temperature 98.2 F (36.8 C), temperature source Temporal, resp. rate 20, height '5\' 3"'  (1.6 m), weight 186 lb 8 oz (84.6 kg), SpO2 100 %.  ECOG PERFORMANCE STATUS: 1 - Symptomatic but completely ambulatory  Physical Exam  Constitutional: Oriented to person, place, and time and well-developed, well-nourished, and in no distress.  HENT:  Head: Normocephalic and atraumatic.  Mouth/Throat: Oropharynx is clear and moist. No oropharyngeal exudate.  Eyes: Conjunctivae are normal. Right eye exhibits no discharge. Left eye exhibits no discharge. No scleral icterus.  Neck: Normal range of motion. Neck supple.  Cardiovascular: Normal rate, regular rhythm, normal heart sounds and intact distal pulses.   Pulmonary/Chest: Effort normal and breath sounds normal. No  respiratory distress. No wheezes. No rales.  Abdominal: Soft. Bowel sounds are normal. Exhibits no distension and no mass. There is no tenderness.  Musculoskeletal: Normal range of motion. Exhibits no edema.  Lymphadenopathy:    No cervical adenopathy.  Neurological: Alert and oriented to person, place, and time. Exhibits normal muscle tone. Ambulates with a walker.  Skin: Skin is warm and dry. No rash noted. Not diaphoretic. No erythema. No pallor.  Psychiatric: Mood, memory and judgment normal.  Vitals reviewed.  LABORATORY DATA: Lab Results  Component Value Date   WBC 10.1 05/08/2020   HGB  14.6 05/08/2020   HCT 44.3 05/08/2020   MCV 89.7 05/08/2020   PLT 320 05/08/2020      Chemistry      Component Value Date/Time   NA 142 05/08/2020 1303   K 3.5 05/08/2020 1303   CL 104 05/08/2020 1303   CO2 26 05/08/2020 1303   BUN 10 05/08/2020 1303   CREATININE 0.92 05/08/2020 1303      Component Value Date/Time   CALCIUM 10.9 (H) 05/08/2020 1303   ALKPHOS 106 05/08/2020 1303   AST 38 05/08/2020 1303   ALT 50 (H) 05/08/2020 1303   BILITOT 0.3 05/08/2020 1303       RADIOGRAPHIC STUDIES: MR CERVICAL SPINE W WO CONTRAST  Result Date: 04/25/2020 [facility] NEURO[facility]AG[facility] REPORT STUDY DATE: 04/24/20 PATIENT NAME: Marika Mahaffy Allen-Smith DOB: 09/09/1950 MRN: [id number] ORDERING CLINICIAN: Star Age, MD CLINICAL HISTORY: 70 year old female with difficulty. EXAM: MR CERVICAL SPINE W WO CONTRAST TECHNIQUE: MRI of the cervical spine was obtained utilizing 3 mm sagittal slices from the posterior fossa down to the T3-4 level with T1, T2 and inversion recovery views. In addition 4 mm axial slices from Q6-5 down to T1-2 level were included with T2 and gradient echo views. CONTRAST: 25m multihance COMPARISON: none [place]AGING [place] [facility] [address](1.5 Tesla MRI)  FINDINGS: On sagittal views the vertebral bodies have normal height and alignment.  The spinal cord is normal in size and appearance, except artifactual STIR and T2 hyperintense signal noted at C2 and C4 levels on sagittal views but not on axial views; this spans intramedullary and extramedullary regions of the intradural space. This is mainly noted on STIR imaging, but slightly noted on T2 views. Mild disc bulging at C4 3 4, C4-5, C5-6 and C6-7. The posterior fossa, pituitary gland and paraspinal soft tissues are unremarkable.  On axial views there is no spinal stenosis or foraminal narrowing. Limited views of the soft tissues of the head and neck are unremarkable.   MRI cervical spine with and  without contrast demonstrating: - Artifactual T2 and STIR hyperintense signal within spinal cord at C2 and C4 levels posteriorly, extending to intradural, extra medullary region.  This is not visualized on axial postcontrast views.  Therefore likely represents technical artifact. - Mild disc bulging as noted above without spinal stenosis or foraminal narrowing. INTERPRETING PHYSICIAN: VPenni Bombard MD Certified in Neurology, Neurophysiology and Neuroimaging [facility] [address] [place] [id number]([phone]  MR THORACIC SPINE W WO CONTRAST  Result Date: 04/29/2020  [facility] [address] [place] [phone] REPORT STUDY DATE: : 04/29/2020 PATIENT NAME: JShikira FolinoAllen-Smith DOB: [birth year]MRN: [id number] CLINICIAN: Dr. ARexene AlbertsCLINICAL HISTORY: 70year old patient being evaluated for gait abnormality COMPARISON FILMS: None EXAM: MRI thoracic spine with and without contrast  TECHNIQUE: MRI of the thoracic spine was  obtained utilizing 3 mm sagittal slices from F6-2 down to the L1-2 level with T1, T2 and inversion recovery views. In addition 4 mm axial slices from Z3-Y8 down to T12-L1 level were included with T1, T2 and gradient echo views.  Postcontrast axial and T1 images were obtained in addition CONTRAST: IV MultiHance IMAGING SITE: [facility] FINDINGS: The thoracic vertebrae demonstrate normal alignment and marrow signal characteristics.  The intervertebral disc and upper thoracic spine show mild loss of disc signal but without frank disc congestion cord compression significant due to foraminal encroachment.  There is mild facet hypertrophy in the lower thoracic region but again without significant compression.  Spinal cord parenchyma shows no signal abnormalities.  Postcontrast images do not result in abnormal areas of enhancement.  Visualized portion of the of the lower cervical  spine and upper lumbar vertebrae appear unremarkable.  Paraspinal soft tissues show no significant abnormalities.   Unremarkable MRI scan of the thoracic spine with and without contrast showing only minor disc signal and facet degenerative changes but without significant compression. INTERPRETING PHYSICIAN: Antony Contras, MD Certified in  Neuroimaging by [place] [employer] and [employer]   ASSESSMENT: This is a very pleasant 70 year old African-American female referred to the clinic for polyclonal gammopathy   PLAN: The patient was seen with Dr. Julien Nordmann today.  The patient had several lab studies performed including a CBC, CMP, LDH, beta-2 microglobulin, kappa/lambda light chains, and quantitative immunoglobulins.  The patient also will have a 24-hour UPEP performed.   The patient CBC is unremarkable.  The patient's CMP shows elevated calcium for which the patient takes calcium supplements daily.  She will follow up with her primary doctor regarding if she should discontinue or decrease her calcium supplementation.  The patient's CMP also showed elevated total protein.  Her creatinine is within normal limits.  Electrolytes within normal limits.  The patient's LDH is normal.   She will return the UPEP once complete.   Dr. Julien Nordmann discussed that her abnormal SPEP may be related to some inflammatory process; however, we will continue with her myeloma panel to ensure no hematologic malignancy. If her studies are abnormal or suspicious for multiple myeloma, Dr. Julien Nordmann discussed that the next step would include a bone marrow biopsy and aspirate. If we needed to proceed with a bone marrow biopsy and aspirate, then I would arrange for a follow up visit 1 week later to review the results.   I will personally call the patient next week with the results and follow up instructions if her labs are abnormal.   The patient voices understanding of current  disease status and treatment options and is in agreement with the current care plan.  All questions were answered. The patient knows to call the clinic with any problems, questions or concerns. We can certainly see the patient much sooner if necessary.  Thank you so much for allowing me to participate in the care of Sumayyah Custodio Allen-Smith. I will continue to follow up the patient with you and assist in her care.  I spent 40-49 minutes in this encounter.   Disclaimer: This note was dictated with voice recognition software. Similar sounding words can inadvertently be transcribed and may not be corrected upon review.   Sergei Delo L Whitfield Dulay May 08, 2020, 3:01 PM  ADDENDUM: Hematology/Oncology Attending: I had a face-to-face encounter with the patient today.  I reviewed her record, labs and recommended her care plan.  This is a very pleasant 70 years  old African-American female with past medical history significant for pituitary adenoma, dyslipidemia, diabetes mellitus as well as hypertension.  The patient with being evaluated by neurology for neck and back pain as well as gait imbalance.  She had several studies including MRI of the brain that showed the pituitary macroadenoma.  During her evaluation she had extensive blood work that were unremarkable except for SPEP that showed no M spike but showed elevated IgG level of 1865 and mildly elevated IgA of 373.  This lab were repeated again and that showed similar finding.  The patient was referred to me today for evaluation and to rule out any concerning gammopathy. When seen today she is feeling fine except for the fatigue and weakness. I recommended for the patient to have complete myeloma panel including beta-2 microglobulin, quantitative immunoglobulin as well as kappa/lambda light chains.  She also will have 24-hour urine protein collection and electrophoresis. We will call the patient with the result and if needed we will consider her for  a bone marrow biopsy and aspirate. The patient agreed to the current plan. She was advised to call immediately if she has any concerning symptoms in the interval.  Eilleen Kempf, MD 05/08/20

## 2020-05-08 ENCOUNTER — Encounter: Payer: Self-pay | Admitting: Physician Assistant

## 2020-05-08 ENCOUNTER — Other Ambulatory Visit: Payer: Self-pay

## 2020-05-08 ENCOUNTER — Ambulatory Visit: Payer: Medicare Other | Admitting: Endocrinology

## 2020-05-08 ENCOUNTER — Inpatient Hospital Stay: Payer: Medicare Other

## 2020-05-08 ENCOUNTER — Inpatient Hospital Stay: Payer: Medicare Other | Attending: Physician Assistant | Admitting: Physician Assistant

## 2020-05-08 DIAGNOSIS — I10 Essential (primary) hypertension: Secondary | ICD-10-CM | POA: Insufficient documentation

## 2020-05-08 DIAGNOSIS — M1990 Unspecified osteoarthritis, unspecified site: Secondary | ICD-10-CM | POA: Diagnosis not present

## 2020-05-08 DIAGNOSIS — D890 Polyclonal hypergammaglobulinemia: Secondary | ICD-10-CM | POA: Insufficient documentation

## 2020-05-08 DIAGNOSIS — D352 Benign neoplasm of pituitary gland: Secondary | ICD-10-CM | POA: Diagnosis not present

## 2020-05-08 DIAGNOSIS — E785 Hyperlipidemia, unspecified: Secondary | ICD-10-CM | POA: Diagnosis not present

## 2020-05-08 DIAGNOSIS — Z79899 Other long term (current) drug therapy: Secondary | ICD-10-CM | POA: Insufficient documentation

## 2020-05-08 DIAGNOSIS — R778 Other specified abnormalities of plasma proteins: Secondary | ICD-10-CM | POA: Insufficient documentation

## 2020-05-08 DIAGNOSIS — E119 Type 2 diabetes mellitus without complications: Secondary | ICD-10-CM | POA: Insufficient documentation

## 2020-05-08 LAB — CBC WITH DIFFERENTIAL (CANCER CENTER ONLY)
Abs Immature Granulocytes: 0.03 10*3/uL (ref 0.00–0.07)
Basophils Absolute: 0.1 10*3/uL (ref 0.0–0.1)
Basophils Relative: 1 %
Eosinophils Absolute: 0 10*3/uL (ref 0.0–0.5)
Eosinophils Relative: 0 %
HCT: 44.3 % (ref 36.0–46.0)
Hemoglobin: 14.6 g/dL (ref 12.0–15.0)
Immature Granulocytes: 0 %
Lymphocytes Relative: 31 %
Lymphs Abs: 3.1 10*3/uL (ref 0.7–4.0)
MCH: 29.6 pg (ref 26.0–34.0)
MCHC: 33 g/dL (ref 30.0–36.0)
MCV: 89.7 fL (ref 80.0–100.0)
Monocytes Absolute: 0.4 10*3/uL (ref 0.1–1.0)
Monocytes Relative: 4 %
Neutro Abs: 6.4 10*3/uL (ref 1.7–7.7)
Neutrophils Relative %: 64 %
Platelet Count: 320 10*3/uL (ref 150–400)
RBC: 4.94 MIL/uL (ref 3.87–5.11)
RDW: 12.8 % (ref 11.5–15.5)
WBC Count: 10.1 10*3/uL (ref 4.0–10.5)
nRBC: 0 % (ref 0.0–0.2)

## 2020-05-08 LAB — CMP (CANCER CENTER ONLY)
ALT: 50 U/L — ABNORMAL HIGH (ref 0–44)
AST: 38 U/L (ref 15–41)
Albumin: 4.2 g/dL (ref 3.5–5.0)
Alkaline Phosphatase: 106 U/L (ref 38–126)
Anion gap: 12 (ref 5–15)
BUN: 10 mg/dL (ref 8–23)
CO2: 26 mmol/L (ref 22–32)
Calcium: 10.9 mg/dL — ABNORMAL HIGH (ref 8.9–10.3)
Chloride: 104 mmol/L (ref 98–111)
Creatinine: 0.92 mg/dL (ref 0.44–1.00)
GFR, Estimated: >60 mL/min (ref >60)
Glucose, Bld: 141 mg/dL — ABNORMAL HIGH (ref 70–99)
Potassium: 3.5 mmol/L (ref 3.5–5.1)
Sodium: 142 mmol/L (ref 135–145)
Total Bilirubin: 0.3 mg/dL (ref 0.3–1.2)
Total Protein: 9.1 g/dL — ABNORMAL HIGH (ref 6.5–8.1)

## 2020-05-08 LAB — LACTATE DEHYDROGENASE: LDH: 186 U/L (ref 98–192)

## 2020-05-09 LAB — IGG, IGA, IGM
IgA: 427 mg/dL — ABNORMAL HIGH (ref 87–352)
IgG (Immunoglobin G), Serum: 2100 mg/dL — ABNORMAL HIGH (ref 586–1602)
IgM (Immunoglobulin M), Srm: 119 mg/dL (ref 26–217)

## 2020-05-09 LAB — KAPPA/LAMBDA LIGHT CHAINS
Kappa free light chain: 29.5 mg/L — ABNORMAL HIGH (ref 3.3–19.4)
Kappa, lambda light chain ratio: 2.02 — ABNORMAL HIGH (ref 0.26–1.65)
Lambda free light chains: 14.6 mg/L (ref 5.7–26.3)

## 2020-05-09 LAB — BETA 2 MICROGLOBULIN, SERUM: Beta-2 Microglobulin: 1.2 mg/L (ref 0.6–2.4)

## 2020-05-10 DIAGNOSIS — D890 Polyclonal hypergammaglobulinemia: Secondary | ICD-10-CM | POA: Diagnosis not present

## 2020-05-13 LAB — UPEP/UIFE/LIGHT CHAINS/TP, 24-HR UR
% BETA, Urine: 0 %
ALPHA 1 URINE: 0 %
Albumin, U: 100 %
Alpha 2, Urine: 0 %
Free Kappa Lt Chains,Ur: 3.29 mg/L (ref 0.63–113.79)
Free Kappa/Lambda Ratio: >4.91 (ref 1.03–31.76)
Free Lambda Lt Chains,Ur: <0.67 mg/L (ref 0.47–11.77)
GAMMA GLOBULIN URINE: 0 %
Total Protein, Urine-Ur/day: 195 mg/24 hr — ABNORMAL HIGH (ref 30–150)
Total Protein, Urine: 12.2 mg/dL
Total Volume: 1600

## 2020-05-14 ENCOUNTER — Telehealth: Payer: Self-pay | Admitting: Physician Assistant

## 2020-05-14 NOTE — Telephone Encounter (Signed)
I reviewed the patient's labs with Dr. Julien Nordmann. He did not feel that her labs were concerning for hematologic pathology or multiple myeloma. He does not recommend further work up or bone marrow biopsy and aspirate at this time. We could see her back in about 6 months for repeat lab work or she can be released to her PCP. I called the patient with this information but was unable to reach her. I did leave her a voicemail with this information and our call back number.

## 2020-05-16 ENCOUNTER — Telehealth: Payer: Self-pay

## 2020-05-16 NOTE — Telephone Encounter (Signed)
Called patient and made her aware of C. Heilingoetter, PA message in previous note on 05/14/20. Patient verbalized understanding and said she will think about whether she will come back to [facility] in 6 months for lab work or go to her PCP and will contact the office once she makes her decision.

## 2020-06-19 ENCOUNTER — Other Ambulatory Visit: Payer: Self-pay

## 2020-06-19 ENCOUNTER — Ambulatory Visit
Admission: RE | Admit: 2020-06-19 | Discharge: 2020-06-19 | Disposition: A | Discharge status: Home / Self Care | Payer: Medicare Other | Source: Ambulatory Visit | Attending: Family Medicine | Admitting: Family Medicine

## 2020-06-19 ENCOUNTER — Ambulatory Visit: Payer: Medicare Other | Admitting: Endocrinology

## 2020-06-19 DIAGNOSIS — Z1231 Encounter for screening mammogram for malignant neoplasm of breast: Secondary | ICD-10-CM

## 2020-07-05 ENCOUNTER — Ambulatory Visit (INDEPENDENT_AMBULATORY_CARE_PROVIDER_SITE_OTHER): Payer: Medicare Other | Admitting: Endocrinology

## 2020-07-05 ENCOUNTER — Other Ambulatory Visit: Payer: Self-pay

## 2020-07-05 DIAGNOSIS — D352 Benign neoplasm of pituitary gland: Secondary | ICD-10-CM | POA: Diagnosis not present

## 2020-07-05 NOTE — Patient Instructions (Addendum)
Blood and urine tests are requested for you today.  We'll let you know about the results.   Your blood pressure is high today.  Please see your primary care provider soon, to have it rechecked.   Please come back for a follow-up appointment in 6 months.

## 2020-07-05 NOTE — Progress Notes (Signed)
Subjective:    Patient ID: Elaine Burke, female    DOB: Mar 29, 1950, 70 y.o.   MRN: [id number]  HPI Pt returns for f/u of pituitary adenoma (dx'ed 2018; MRI showed 1.5 cm macroadenoma. Mild suprasellar extension without mass effect); NS advised f/u 1 year).    She has these pit functions: TSH: normal FSH/LH: borderline menopausal.  Prolactin: borderline high ACTH: normal (and ON dext test is normal).   VP: pt did not do USG GH: normal ASUB: normal  Interval hx: She will f/u with NS soon.  Gait is sill abnormal (neurol said no neuro cause).  She was also noted at oncol to have hypercalcemia.  Past Medical History:  Diagnosis Date  . Arthritis   . Arthritis   . Diabetes mellitus without complication ([place])   . HLD (hyperlipidemia)   . Hypertension     Past Surgical History:  Procedure Laterality Date  . ABDOMINAL HYSTERECTOMY    . CESAREAN SECTION    . COLONOSCOPY      Social History   Socioeconomic History  . Marital status: Married    Spouse name: Not on file  . Number of children: Not on file  . Years of education: Not on file  . Highest education level: Not on file  Occupational History  . Not on file  Tobacco Use  . Smoking status: Never Smoker  . Smokeless tobacco: Never Used  Substance and Sexual Activity  . Alcohol use: No  . Drug use: No  . Sexual activity: Not on file  Other Topics Concern  . Not on file  Social History Narrative  . Not on file   Social Determinants of Health   Financial Resource Strain: Not on file  Food Insecurity: Not on file  Transportation Needs: Not on file  Physical Activity: Not on file  Stress: Not on file  Social Connections: Not on file  Intimate Partner Violence: Not on file    Current Outpatient Medications on File Prior to Visit  Medication Sig Dispense Refill  . amLODipine (NORVASC) 10 MG tablet Take 1 tablet by mouth daily.  5  . CALCIUM PO Take by mouth.    . loratadine (CLARITIN) 10 MG tablet  Take 10 mg by mouth daily.    . Multiple Vitamins-Minerals (MULTIVITAL PO) Take by mouth.    . potassium chloride (KLOR-CON) 10 MEQ tablet Take 10 mEq by mouth 2 (two) times daily.    . rosuvastatin (CRESTOR) 5 MG tablet Take 5 mg by mouth daily.    . valsartan-hydrochlorothiazide (DIOVAN-HCT) 320-25 MG tablet Take 1 tablet by mouth daily.    Marland Kitchen VITAMIN D PO Take 5,000 Units by mouth.     No current facility-administered medications on file prior to visit.    Family History  Problem Relation Age of Onset  . Breast cancer Paternal Aunt   . Other Neg Hx        pituitary abnormality    BP (!) 150/92 (BP Location: Right Arm, Patient Position: Sitting, Cuff Size: Large)   Pulse 94   Ht 5\' 3"  (1.6 m)   Wt 188 lb 12.8 oz (85.6 kg)   SpO2 96%   BMI 33.44 kg/m    Review of Systems     Objective:   Physical Exam    Lab Results  Component Value Date   PTH 62 11/15/2019   CALCIUM 10.9 (H) 05/08/2020   CAION 1.30 11/26/2016       Assessment & Plan:  HTN:  is noted today. We can't decrease HCTZ for now, so we'll continue the same Hypercalcemia, new to me, uncertain etiology and prognosis.    Patient Instructions  Blood and urine tests are requested for you today.  We'll let you know about the results.   Your blood pressure is high today.  Please see your primary care provider soon, to have it rechecked.   Please come back for a follow-up appointment in 6 months.

## 2020-07-10 ENCOUNTER — Other Ambulatory Visit: Payer: Self-pay

## 2020-07-10 ENCOUNTER — Other Ambulatory Visit (INDEPENDENT_AMBULATORY_CARE_PROVIDER_SITE_OTHER): Payer: Medicare Other

## 2020-07-10 DIAGNOSIS — D352 Benign neoplasm of pituitary gland: Secondary | ICD-10-CM

## 2020-07-10 LAB — T4, FREE: Free T4: 0.67 ng/dL (ref 0.60–1.60)

## 2020-07-10 LAB — FOLLICLE STIMULATING HORMONE: FSH: 50 m[IU]/mL

## 2020-07-10 LAB — CORTISOL: Cortisol, Plasma: 10.4 ug/dL

## 2020-07-10 LAB — TSH: TSH: 2.19 u[IU]/mL (ref 0.35–4.50)

## 2020-07-10 LAB — LUTEINIZING HORMONE: LH: 22.09 m[IU]/mL

## 2020-07-11 ENCOUNTER — Other Ambulatory Visit: Payer: Self-pay | Admitting: Endocrinology

## 2020-07-11 LAB — URINALYSIS, ROUTINE W REFLEX MICROSCOPIC
Bilirubin Urine: NEGATIVE
Ketones, ur: NEGATIVE
Nitrite: NEGATIVE
Specific Gravity, Urine: 1.02 (ref 1.000–1.030)
Urine Glucose: NEGATIVE
Urobilinogen, UA: 0.2 (ref 0.0–1.0)
pH: 6.5 (ref 5.0–8.0)

## 2020-07-11 LAB — PROLACTIN: Prolactin: 22.1 ng/mL

## 2020-07-11 LAB — PTH, INTACT AND CALCIUM
Calcium: 10.9 mg/dL — ABNORMAL HIGH (ref 8.6–10.4)
PTH: 40 pg/mL (ref 16–77)

## 2020-07-15 ENCOUNTER — Other Ambulatory Visit: Payer: Self-pay

## 2020-07-16 ENCOUNTER — Other Ambulatory Visit: Payer: Self-pay

## 2020-07-18 ENCOUNTER — Other Ambulatory Visit: Payer: Self-pay | Admitting: Neurological Surgery

## 2020-07-18 DIAGNOSIS — D352 Benign neoplasm of pituitary gland: Secondary | ICD-10-CM

## 2020-07-26 ENCOUNTER — Ambulatory Visit
Admission: RE | Admit: 2020-07-26 | Discharge: 2020-07-26 | Disposition: A | Discharge status: Home / Self Care | Payer: Medicare Other | Source: Ambulatory Visit | Attending: Neurological Surgery | Admitting: Neurological Surgery

## 2020-07-26 ENCOUNTER — Other Ambulatory Visit: Payer: Self-pay

## 2020-07-26 DIAGNOSIS — D352 Benign neoplasm of pituitary gland: Secondary | ICD-10-CM

## 2020-07-26 MED ORDER — GADOBENATE DIMEGLUMINE 529 MG/ML IV SOLN
8.0000 mL | Freq: Once | INTRAVENOUS | Status: AC | PRN
  Administered 2020-07-26: 8 mL via INTRAVENOUS

## 2021-01-01 ENCOUNTER — Ambulatory Visit: Payer: Medicare Other | Admitting: Endocrinology

## 2021-01-20 ENCOUNTER — Ambulatory Visit (INDEPENDENT_AMBULATORY_CARE_PROVIDER_SITE_OTHER): Payer: Medicare Other | Admitting: Endocrinology

## 2021-01-20 ENCOUNTER — Other Ambulatory Visit: Payer: Self-pay

## 2021-01-20 DIAGNOSIS — I10 Essential (primary) hypertension: Secondary | ICD-10-CM | POA: Diagnosis not present

## 2021-01-20 DIAGNOSIS — T465X5A Adverse effect of other antihypertensive drugs, initial encounter: Secondary | ICD-10-CM | POA: Diagnosis not present

## 2021-01-20 MED ORDER — FUROSEMIDE 20 MG PO TABS: 20.0000 mg | ORAL_TABLET | Freq: Every day | ORAL | 3 refills | Status: DC

## 2021-01-20 NOTE — Progress Notes (Signed)
Subjective:    Patient ID: Elaine Burke, female    DOB: [birth year], 70 y.o.   MRN: [id number]  HPI Pt returns for f/u of pituitary adenoma (dx'ed 2018; she has never required rx; MRI showed 1.5 cm macroadenoma. Mild suprasellar extension without mass effect; f/u in 2022 was unchanged; NS advised f/u 1 year).    She has these pit functions: TSH: normal FSH/LH: borderline menopausal.  Prolactin: borderline high ACTH: normal (and ON dext test is normal).   VP: pt did not do USG GH: normal ASUB: normal    Interval hx: Gait is sill abnormal (neurol said no neuro cause).  She was also noted at oncol to have hypercalcemia.   Pt says she never misses the BP meds.  Past Medical History:  Diagnosis Date   Arthritis    Arthritis    Diabetes mellitus without complication (HCC)    HLD (hyperlipidemia)    Hypertension     Past Surgical History:  Procedure Laterality Date   ABDOMINAL HYSTERECTOMY     CESAREAN SECTION     COLONOSCOPY      Social History   Socioeconomic History   Marital status: Married    Spouse name: Not on file   Number of children: Not on file   Years of education: Not on file   Highest education level: Not on file  Occupational History   Not on file  Tobacco Use   Smoking status: Never   Smokeless tobacco: Never  Substance and Sexual Activity   Alcohol use: No   Drug use: No   Sexual activity: Not on file  Other Topics Concern   Not on file  Social History Narrative   Not on file   Social Determinants of Health   Financial Resource Strain: Not on file  Food Insecurity: Not on file  Transportation Needs: Not on file  Physical Activity: Not on file  Stress: Not on file  Social Connections: Not on file  Intimate Partner Violence: Not on file    Current Outpatient Medications on File Prior to Visit  Medication Sig Dispense Refill   amLODipine (NORVASC) 10 MG tablet Take 1 tablet by mouth daily.  5   CALCIUM PO Take by mouth.      loratadine (CLARITIN) 10 MG tablet Take 10 mg by mouth daily.     Multiple Vitamins-Minerals (MULTIVITAL PO) Take by mouth.     potassium chloride (KLOR-CON) 10 MEQ tablet Take 10 mEq by mouth 2 (two) times daily.     rosuvastatin (CRESTOR) 5 MG tablet Take 5 mg by mouth daily.     valsartan-hydrochlorothiazide (DIOVAN-HCT) 320-25 MG tablet Take 1 tablet by mouth daily.     VITAMIN D PO Take 5,000 Units by mouth.     No current facility-administered medications on file prior to visit.      BP (!) 158/102 (BP Location: Left Arm, Patient Position: Sitting, Cuff Size: Normal)   Pulse 84   Ht 5\' 3"  (1.6 m)   Wt 191 lb 6.4 oz (86.8 kg)   SpO2 100%   BMI 33.90 kg/m   Review of Systems     Objective:   Physical Exam VITAL SIGNS:  See vs page GENERAL: no distress EXT: 1+ bilat leg edema GAIT: steady, with a walker.    Lab Results  Component Value Date   CREATININE 0.92 05/08/2020   BUN 10 05/08/2020   NA 142 05/08/2020   K 3.5 05/08/2020   CL 104 05/08/2020  CO2 26 05/08/2020       Assessment & Plan:  HTN: uncontrolled Hypercalcemia: caused or exac by HCTZ Pit adenoma: recheck with next labs.    Patient Instructions  We should reduce the HCTZ in order to see if this is causing the high blood calcium.  However, your blood pressure is too high today to reduce it now.   I have sent a prescription to your pharmacy, to add another fluid pill.  Please come back for a follow-up appointment in 4 weeks.

## 2021-01-20 NOTE — Patient Instructions (Addendum)
We should reduce the HCTZ in order to see if this is causing the high blood calcium.  However, your blood pressure is too high today to reduce it now.   I have sent a prescription to your pharmacy, to add another fluid pill.  Please come back for a follow-up appointment in 4 weeks.

## 2021-01-21 DIAGNOSIS — I1A0 Resistant hypertension: Secondary | ICD-10-CM | POA: Insufficient documentation

## 2021-01-21 DIAGNOSIS — I10 Essential (primary) hypertension: Secondary | ICD-10-CM | POA: Insufficient documentation

## 2021-02-19 ENCOUNTER — Ambulatory Visit (INDEPENDENT_AMBULATORY_CARE_PROVIDER_SITE_OTHER): Payer: Medicare Other | Admitting: Endocrinology

## 2021-02-19 ENCOUNTER — Other Ambulatory Visit: Payer: Self-pay

## 2021-02-19 MED ORDER — VALSARTAN-HYDROCHLOROTHIAZIDE 320-12.5 MG PO TABS: 1.0000 | ORAL_TABLET | Freq: Every day | ORAL | 3 refills | Status: DC

## 2021-02-19 NOTE — Progress Notes (Signed)
Subjective:    Patient ID: Elaine Burke, female    DOB: 22-Apr-1950, 70 y.o.   MRN: [id number]  HPI Pt returns for f/u of pituitary adenoma (dx'ed 2018; she has never required rx; MRI showed 1.5 cm macroadenoma. Mild suprasellar extension without mass effect; f/u in 2022 was unchanged; NS advised f/u 1 year).    She has these pit functions: TSH: normal FSH/LH: borderline menopausal.  Prolactin: borderline high ACTH: normal (and ON dext test is normal).   VP: USG is 1.020 GH: normal ASUB: normal   She also has hypercalcemia pt states she feels well in general. She has not seen Dr Ernie Hew, about the Diovan-HCT.  She takes meds as rx'ed.  She takes Vit-D, 5000 units/d.   Past Medical History:  Diagnosis Date   Arthritis    Arthritis    Diabetes mellitus without complication (HCC)    HLD (hyperlipidemia)    Hypertension     Past Surgical History:  Procedure Laterality Date   ABDOMINAL HYSTERECTOMY     CESAREAN SECTION     COLONOSCOPY      Social History   Socioeconomic History   Marital status: Married    Spouse name: Not on file   Number of children: Not on file   Years of education: Not on file   Highest education level: Not on file  Occupational History   Not on file  Tobacco Use   Smoking status: Never   Smokeless tobacco: Never  Substance and Sexual Activity   Alcohol use: No   Drug use: No   Sexual activity: Not on file  Other Topics Concern   Not on file  Social History Narrative   Not on file   Social Determinants of Health   Financial Resource Strain: Not on file  Food Insecurity: Not on file  Transportation Needs: Not on file  Physical Activity: Not on file  Stress: Not on file  Social Connections: Not on file  Intimate Partner Violence: Not on file    Current Outpatient Medications on File Prior to Visit  Medication Sig Dispense Refill   amLODipine (NORVASC) 10 MG tablet Take 1 tablet by mouth daily.  5   CALCIUM PO Take by mouth.      furosemide (LASIX) 20 MG tablet Take 1 tablet (20 mg total) by mouth daily. 90 tablet 3   loratadine (CLARITIN) 10 MG tablet Take 10 mg by mouth daily.     Multiple Vitamins-Minerals (MULTIVITAL PO) Take by mouth.     potassium chloride (KLOR-CON) 10 MEQ tablet Take 10 mEq by mouth 2 (two) times daily.     rosuvastatin (CRESTOR) 5 MG tablet Take 5 mg by mouth daily.     VITAMIN D PO Take 5,000 Units by mouth.     No current facility-administered medications on file prior to visit.    Allergies  Allergen Reactions   Shellfish Allergy Swelling   Shellfish Allergy Swelling   Lisinopril Other (See Comments)    MD said affects kidneys. No take    Penicillins Hives, Itching and Swelling    Has patient had a PCN reaction causing immediate rash, facial/tongue/throat swelling, SOB or lightheadedness with hypotension: No Has patient had a PCN reaction causing severe rash involving mucus membranes or skin necrosis: No Has patient had a PCN reaction that required hospitalization: No Has patient had a PCN reaction occurring within the last 10 years: No If all of the above answers are "NO", then may proceed with Cephalosporin  use.     Family History  Problem Relation Age of Onset   Breast cancer Paternal Aunt    Other Neg Hx        pituitary abnormality    BP 136/80    Pulse 70    Ht 5\' 3"  (1.6 m)    Wt 190 lb 12.8 oz (86.5 kg)    SpO2 99%    BMI 33.80 kg/m    Review of Systems Denies sob.    Objective:   Physical Exam VITAL SIGNS:  See vs page.   GENERAL: no distress.   EXT: no ankle edema.    Lab Results  Component Value Date   CREATININE 0.92 05/08/2020   BUN 10 05/08/2020   NA 142 05/08/2020   K 3.5 05/08/2020   CL 104 05/08/2020   CO2 26 05/08/2020      Assessment & Plan:  Hypercalcemia: uncertain etiology and prognosis HTN: well-controlled.  We can reduce HCTZ component to help elev Ca++  Patient Instructions  Please change the Valsartan-HCT 320/25 to 320/12.5,  1 pill per day.   Blood tests are requested for you today.  We'll let you know about the results.   Please come back for a follow-up appointment in 2 months.

## 2021-02-19 NOTE — Patient Instructions (Addendum)
Please change the Valsartan-HCT 320/25 to 320/12.5, 1 pill per day.   Blood tests are requested for you today.  We'll let you know about the results.   Please come back for a follow-up appointment in 2 months.

## 2021-02-20 LAB — BASIC METABOLIC PANEL
BUN: 12 mg/dL (ref 6–23)
CO2: 32 mEq/L (ref 19–32)
Calcium: 11.2 mg/dL — ABNORMAL HIGH (ref 8.4–10.5)
Chloride: 98 mEq/L (ref 96–112)
Creatinine, Ser: 0.97 mg/dL (ref 0.40–1.20)
GFR: 59.75 mL/min — ABNORMAL LOW (ref >60.00)
Glucose, Bld: 106 mg/dL — ABNORMAL HIGH (ref 70–99)
Potassium: 3.9 mEq/L (ref 3.5–5.1)
Sodium: 138 mEq/L (ref 135–145)

## 2021-02-20 LAB — VITAMIN D 25 HYDROXY (VIT D DEFICIENCY, FRACTURES): VITD: 45.12 ng/mL (ref 30.00–100.00)

## 2021-02-25 LAB — VITAMIN D 1,25 DIHYDROXY
Vitamin D 1, 25 (OH)2 Total: 64 pg/mL (ref 18–72)
Vitamin D2 1, 25 (OH)2: <8 pg/mL
Vitamin D3 1, 25 (OH)2: 64 pg/mL

## 2021-02-25 LAB — PTH, INTACT AND CALCIUM
Calcium: 11.5 mg/dL — ABNORMAL HIGH (ref 8.6–10.4)
PTH: 61 pg/mL (ref 16–77)

## 2021-02-25 LAB — VITAMIN A: Vitamin A (Retinoic Acid): 61 ug/dL (ref 38–98)

## 2021-02-25 LAB — PTH-RELATED PEPTIDE: PTH-Related Protein (PTH-RP): 11 pg/mL (ref 11–20)

## 2021-02-27 ENCOUNTER — Telehealth: Payer: Self-pay | Admitting: Endocrinology

## 2021-02-27 DIAGNOSIS — I10 Essential (primary) hypertension: Secondary | ICD-10-CM

## 2021-02-27 MED ORDER — VALSARTAN-HYDROCHLOROTHIAZIDE 320-12.5 MG PO TABS: 1.0000 | ORAL_TABLET | Freq: Every day | ORAL | 3 refills | Status: DC

## 2021-02-27 NOTE — Telephone Encounter (Signed)
Patient called re: PHARM listed below told Patient they did not receive the following RX and request that Patient call to resend the following RX:  MEDICATION: valsartan-hydrochlorothiazide (DIOVAN-HCT) 320-12.5 MG tablet  PHARMACY:   [facility] [place] - [address] Phone:  [phone]  Fax:  [phone]      HAS THE PATIENT CONTACTED [facility]?  Yes-please see note above  IS THIS A 90 DAY SUPPLY : Yes  IS PATIENT OUT OF MEDICATION: New medication N/A  IF NOT; HOW MUCH IS LEFT:   LAST APPOINTMENT DATE: @12 /14/2022  NEXT APPOINTMENT DATE:@2 /15/2023  DO WE HAVE YOUR PERMISSION TO LEAVE A DETAILED MESSAGE?: Yes  OTHER COMMENTS:    **Let patient know to contact pharmacy at the end of the day to make sure medication is ready. **  ** Please notify patient to allow 48-72 hours to process**  **Encourage patient to contact the pharmacy for refills or they can request refills through [facility]**

## 2021-02-27 NOTE — Telephone Encounter (Signed)
RX has now been sent

## 2021-04-21 IMAGING — MG MM DIGITAL SCREENING BILAT W/ TOMO AND CAD
8 series · 8 of 24 positions shown · non-contrast
Comparison: Previous exam(s).

CLINICAL DATA: Screening.

EXAM:
DIGITAL SCREENING BILATERAL MAMMOGRAM WITH TOMOSYNTHESIS AND CAD
TECHNIQUE: Bilateral screening digital craniocaudal and mediolateral oblique
mammograms were obtained. Bilateral screening digital breast
tomosynthesis was performed. The images were evaluated with
computer-aided detection.

[R CC synth-2D]
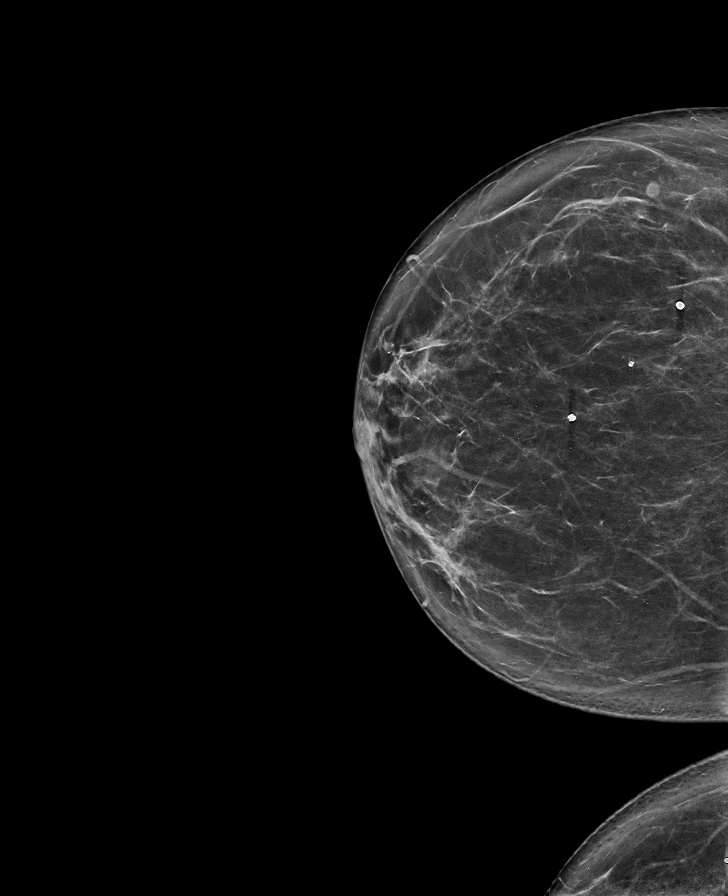

[L CC synth-2D]
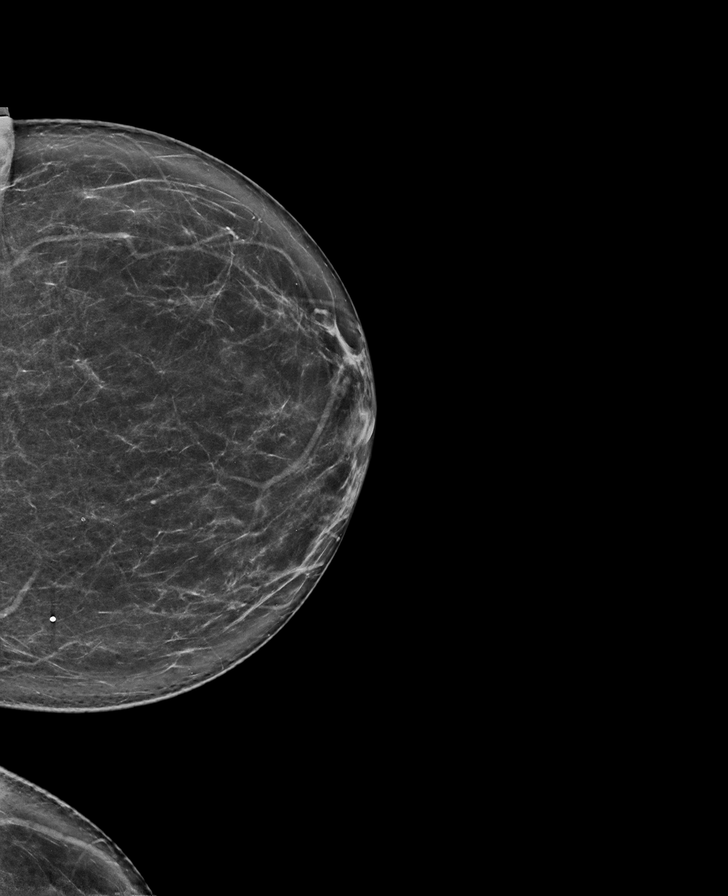

[L MLO synth-2D]
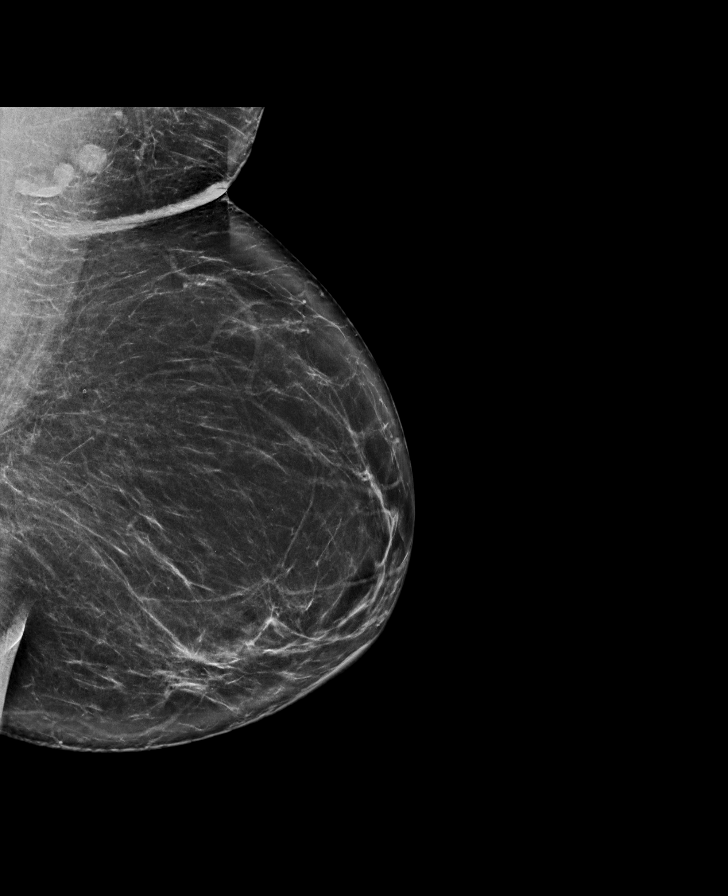

[R MLO synth-2D]
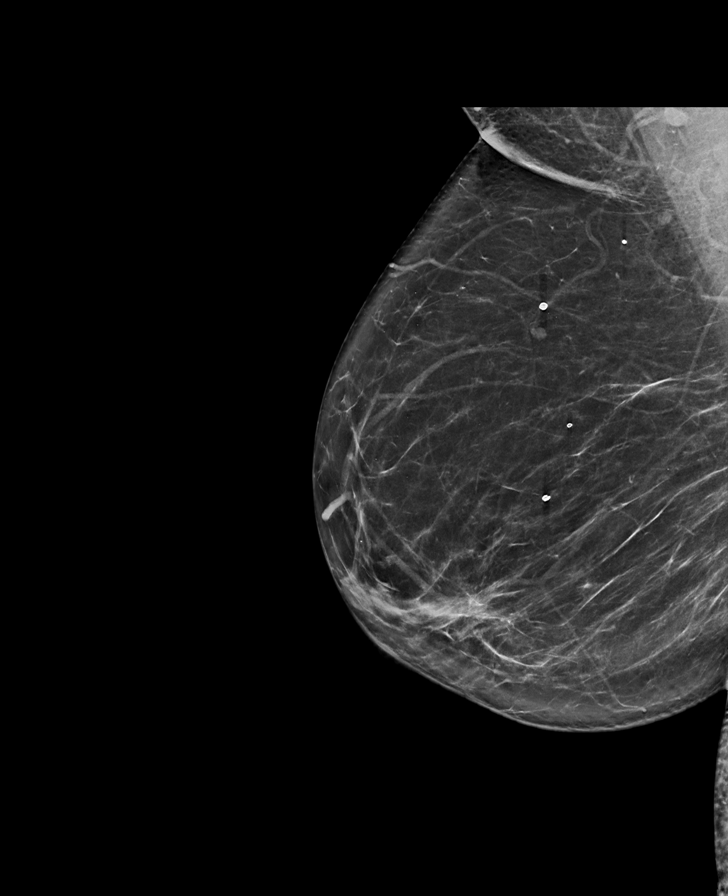

[R MLO tomo · tomo slice 43/85.0]
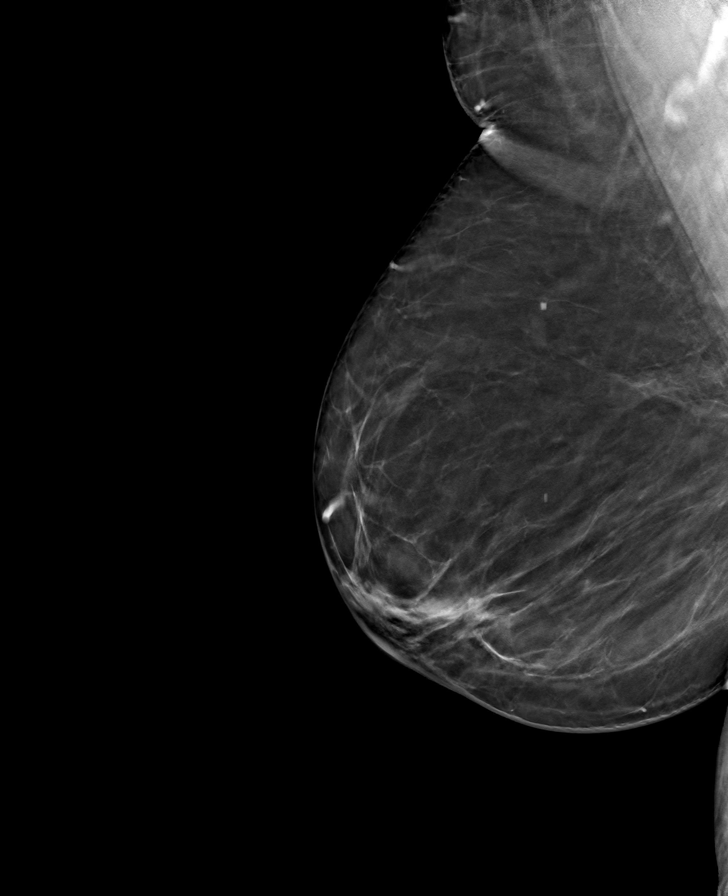

[R CC tomo · tomo slice 41/82.0]
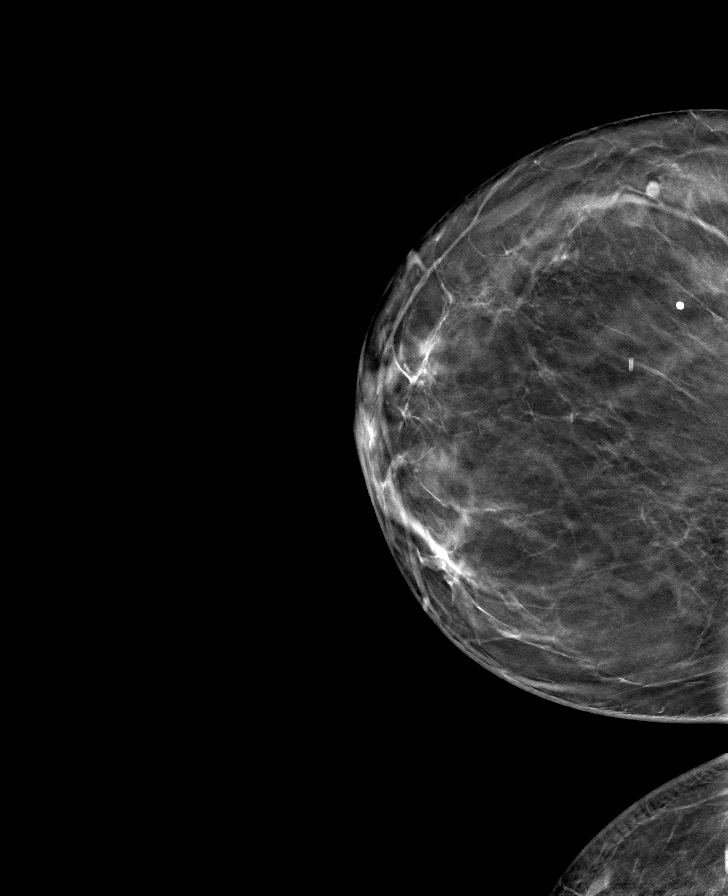

[L CC tomo · tomo slice 29/58.0]
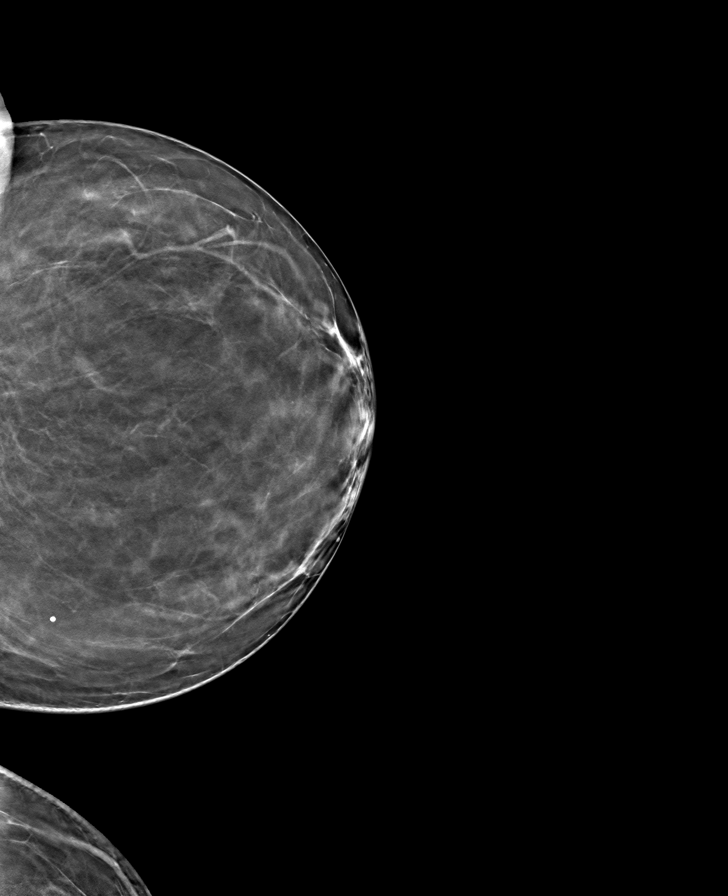

[L MLO tomo · tomo slice 43/86.0]
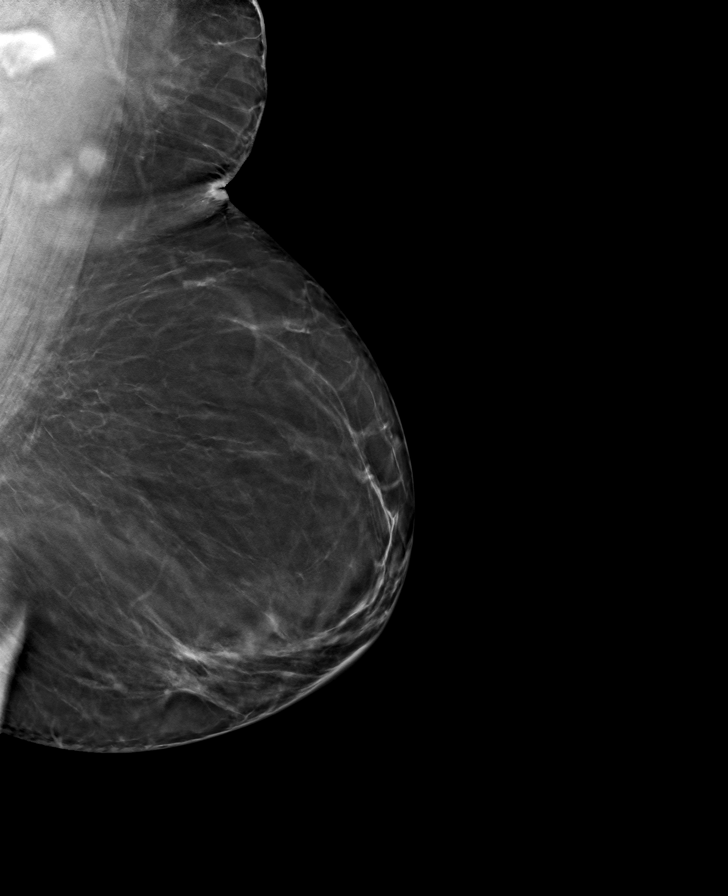

[8 of 24 positions shown; findings below may reference images not displayed]

ACR Breast Density Category b: There are scattered areas of
fibroglandular density.
FINDINGS: There are no findings suspicious for malignancy. The images were
evaluated with computer-aided detection.
IMPRESSION: No mammographic evidence of malignancy. A result letter of this
screening mammogram will be mailed directly to the patient.

RECOMMENDATION:
Screening mammogram in one year. (Code:[id number]-[id number])

BI-RADS CATEGORY  1: Negative.

## 2021-04-23 ENCOUNTER — Other Ambulatory Visit: Payer: Self-pay

## 2021-04-23 ENCOUNTER — Ambulatory Visit (INDEPENDENT_AMBULATORY_CARE_PROVIDER_SITE_OTHER): Payer: Medicare HMO | Admitting: Endocrinology

## 2021-04-23 ENCOUNTER — Encounter: Payer: Self-pay | Admitting: Endocrinology

## 2021-04-23 VITALS — BP 138/100 | HR 75 | Ht 63.0 in | Wt 192.0 lb

## 2021-04-23 DIAGNOSIS — I10 Essential (primary) hypertension: Secondary | ICD-10-CM

## 2021-04-23 MED ORDER — ISOSORB DINITRATE-HYDRALAZINE 20-37.5 MG PO TABS: 0.5000 | ORAL_TABLET | Freq: Three times a day (TID) | ORAL | 11 refills | Status: DC

## 2021-04-23 NOTE — Patient Instructions (Addendum)
Blood tests are requested for you today.  We'll let you know about the results.  I have sent a prescription to your pharmacy, to add another blood pressure pill.     Please come back for a follow-up appointment in 2 months.

## 2021-04-23 NOTE — Progress Notes (Signed)
Subjective:    Patient ID: Elaine Burke, female    DOB: 1951/01/29, 71 y.o.   MRN: [id number]  HPI Pt returns for f/u of pituitary adenoma (dx'ed 2018; she has never required rx; MRI showed 1.5 cm macroadenoma. Mild suprasellar extension without mass effect; f/u in 2022 was unchanged; NS advised f/u 1 year).    She has these pit functions:  TSH: normal FSH/LH: borderline menopausal.  Prolactin: borderline high ACTH: normal (and ON dext test is normal).   VP: USG is 1.020 GH: normal ASUB: normal   She also has hypercalcemia (dx'ed 2018; she is on HCTZ; other w/u was neg, including PTH).  pt states she feels well in general.  She takes meds as rx'ed.  She takes Vit-D, 5000 units/d. Past Medical History:  Diagnosis Date   Arthritis    Arthritis    Diabetes mellitus without complication (HCC)    HLD (hyperlipidemia)    Hypertension     Past Surgical History:  Procedure Laterality Date   ABDOMINAL HYSTERECTOMY     CESAREAN SECTION     COLONOSCOPY      Social History   Socioeconomic History   Marital status: Married    Spouse name: Not on file   Number of children: Not on file   Years of education: Not on file   Highest education level: Not on file  Occupational History   Not on file  Tobacco Use   Smoking status: Never   Smokeless tobacco: Never  Substance and Sexual Activity   Alcohol use: No   Drug use: No   Sexual activity: Not on file  Other Topics Concern   Not on file  Social History Narrative   Not on file   Social Determinants of Health   Financial Resource Strain: Not on file  Food Insecurity: Not on file  Transportation Needs: Not on file  Physical Activity: Not on file  Stress: Not on file  Social Connections: Not on file  Intimate Partner Violence: Not on file    Current Outpatient Medications on File Prior to Visit  Medication Sig Dispense Refill   amLODipine (NORVASC) 10 MG tablet Take 1 tablet by mouth daily.  5   CALCIUM PO  Take by mouth.     furosemide (LASIX) 20 MG tablet Take 1 tablet (20 mg total) by mouth daily. 90 tablet 3   loratadine (CLARITIN) 10 MG tablet Take 10 mg by mouth daily.     Multiple Vitamins-Minerals (MULTIVITAL PO) Take by mouth.     potassium chloride (KLOR-CON) 10 MEQ tablet Take 10 mEq by mouth 2 (two) times daily.     rosuvastatin (CRESTOR) 5 MG tablet Take 5 mg by mouth daily.     valsartan-hydrochlorothiazide (DIOVAN-HCT) 320-12.5 MG tablet Take 1 tablet by mouth daily. 90 tablet 3   VITAMIN D PO Take 5,000 Units by mouth.     No current facility-administered medications on file prior to visit.      Family History  Problem Relation Age of Onset   Breast cancer Paternal Aunt    Other Neg Hx        pituitary abnormality    BP (!) 138/100    Pulse 75    Ht 5\' 3"  (1.6 m)    Wt 192 lb (87.1 kg)    SpO2 98%    BMI 34.01 kg/m    Review of Systems     Objective:   Physical Exam VITAL SIGNS:  See vs page  GENERAL: no distress Ext: trace bilat leg edema.      Assessment & Plan:  HTN: uncontrolled Hypercalcemia: recheck today.   Patient Instructions  Blood tests are requested for you today.  We'll let you know about the results.  I have sent a prescription to your pharmacy, to add another blood pressure pill.     Please come back for a follow-up appointment in 2 months.

## 2021-04-24 LAB — BRAIN NATRIURETIC PEPTIDE: Pro B Natriuretic peptide (BNP): 57 pg/mL (ref 0.0–100.0)

## 2021-04-24 LAB — BASIC METABOLIC PANEL
BUN: 10 mg/dL (ref 6–23)
CO2: 35 mEq/L — ABNORMAL HIGH (ref 19–32)
Calcium: 11.1 mg/dL — ABNORMAL HIGH (ref 8.4–10.5)
Chloride: 99 mEq/L (ref 96–112)
Creatinine, Ser: 0.9 mg/dL (ref 0.40–1.20)
GFR: 65.29 mL/min (ref >60.00)
Glucose, Bld: 126 mg/dL — ABNORMAL HIGH (ref 70–99)
Potassium: 3.9 mEq/L (ref 3.5–5.1)
Sodium: 137 mEq/L (ref 135–145)

## 2021-06-04 ENCOUNTER — Ambulatory Visit: Payer: Medicare HMO | Admitting: Endocrinology

## 2021-06-13 ENCOUNTER — Other Ambulatory Visit: Payer: Self-pay | Admitting: Endocrinology

## 2021-06-13 DIAGNOSIS — I10 Essential (primary) hypertension: Secondary | ICD-10-CM

## 2021-07-14 ENCOUNTER — Ambulatory Visit (INDEPENDENT_AMBULATORY_CARE_PROVIDER_SITE_OTHER): Payer: Medicare HMO | Admitting: Endocrinology

## 2021-07-14 ENCOUNTER — Encounter: Payer: Self-pay | Admitting: Endocrinology

## 2021-07-14 MED ORDER — VALSARTAN 320 MG PO TABS
320.0000 mg | ORAL_TABLET | Freq: Every day | ORAL | 1 refills | Status: AC
Start: 2021-07-14 — End: ?

## 2021-07-14 NOTE — Patient Instructions (Addendum)
I have sent a prescription to your pharmacy, to change valsartan-HCTZ to valsartan alone.   ?Please continue the same other medications.   ?You should have an endocrinology follow-up appointment in 6 weeks.   ?

## 2021-07-14 NOTE — Progress Notes (Signed)
? ?Subjective:  ? ? Patient ID: Elaine Burke, female    DOB: 1950-11-19, 71 y.o.   MRN: [id number] ? ?HPI ?Pt returns for f/u of pituitary adenoma (dx'ed 2018; she has never required rx; MRI showed 1.5 cm macroadenoma. Mild suprasellar extension without mass effect; f/u in 2022 was unchanged; NS advised f/u 1 year).    ?She has these pit functions:  ?TSH: normal ?FSH/LH: borderline menopausal.  ?Prolactin: borderline high ?ACTH: normal (and ON dext test is normal).   ?VP: USG is 1.020 ?GH: normal ?ASUB: normal   ?She also has hypercalcemia (dx'ed 2018; she is on HCTZ; other w/u was neg, including PTH).  pt states she feels well in general.  She takes meds as rx'ed.  She takes Vit-D, 5000 units/d. ?She has slight lightheadedness.  She says BP was 100/70.   ?Past Medical History:  ?Diagnosis Date  ? Arthritis   ? Arthritis   ? Diabetes mellitus without complication ([place])   ? HLD (hyperlipidemia)   ? Hypertension   ? ? ?Past Surgical History:  ?Procedure Laterality Date  ? ABDOMINAL HYSTERECTOMY    ? CESAREAN SECTION    ? COLONOSCOPY    ? ? ?Social History  ? ?Socioeconomic History  ? Marital status: Married  ?  Spouse name: Not on file  ? Number of children: Not on file  ? Years of education: Not on file  ? Highest education level: Not on file  ?Occupational History  ? Not on file  ?Tobacco Use  ? Smoking status: Never  ? Smokeless tobacco: Never  ?Substance and Sexual Activity  ? Alcohol use: No  ? Drug use: No  ? Sexual activity: Not on file  ?Other Topics Concern  ? Not on file  ?Social History Narrative  ? Not on file  ? ?Social Determinants of Health  ? ?Financial Resource Strain: Not on file  ?Food Insecurity: Not on file  ?Transportation Needs: Not on file  ?Physical Activity: Not on file  ?Stress: Not on file  ?Social Connections: Not on file  ?Intimate Partner Violence: Not on file  ? ? ?Current Outpatient Medications on File Prior to Visit  ?Medication Sig Dispense Refill  ? amLODipine (NORVASC)  10 MG tablet Take 1 tablet by mouth daily.  5  ? CALCIUM PO Take by mouth.    ? furosemide (LASIX) 20 MG tablet Take 1 tablet (20 mg total) by mouth daily. 90 tablet 3  ? isosorbide-hydrALAZINE (BIDIL) 20-37.5 MG tablet Take 0.5 tablets by mouth 3 (three) times daily. 45 tablet 11  ? loratadine (CLARITIN) 10 MG tablet Take 10 mg by mouth daily.    ? Multiple Vitamins-Minerals (MULTIVITAL PO) Take by mouth.    ? potassium chloride (KLOR-CON) 10 MEQ tablet Take 10 mEq by mouth 2 (two) times daily.    ? rosuvastatin (CRESTOR) 5 MG tablet Take 5 mg by mouth daily.    ? VITAMIN D PO Take 5,000 Units by mouth.    ? ?No current facility-administered medications on file prior to visit.  ? ? ? ?Family History  ?Problem Relation Age of Onset  ? Breast cancer Paternal Aunt   ? Other Neg Hx   ?     pituitary abnormality  ? ? ?BP 110/78 (BP Location: Left Arm, Patient Position: Sitting, Cuff Size: Normal)   Pulse 71   Ht '5\' 3"'$  (1.6 m)   Wt 190 lb 3.2 oz (86.3 kg)   SpO2 98%   BMI 33.69 kg/m?  ? ?  Review of Systems ?Denies LOC ?   ?Objective:  ? Physical Exam ?VITAL SIGNS:  See vs page.  ?GENERAL: no distress ?EXT: no leg edema. ? ? ?Lab Results  ?Component Value Date  ? PTH 61 02/19/2021  ? CALCIUM 11.1 (H) 04/23/2021  ? CAION 1.30 11/26/2016  ? ?Lab Results  ?Component Value Date  ? CREATININE 0.90 04/23/2021  ? BUN 10 04/23/2021  ? NA 137 04/23/2021  ? K 3.9 04/23/2021  ? CL 99 04/23/2021  ? CO2 35 (H) 04/23/2021  ? ?   ?Assessment & Plan:  ?HTN: overcontrolled ?Hypercalcemia: goal is to phase out HCTZ, then recheck.   ? ?Patient Instructions  ?I have sent a prescription to your pharmacy, to change valsartan-HCTZ to valsartan alone.   ?Please continue the same other medications.   ?You should have an endocrinology follow-up appointment in 6 weeks.   ? ? ?

## 2021-07-17 ENCOUNTER — Other Ambulatory Visit: Payer: Self-pay | Admitting: Family Medicine

## 2021-07-17 DIAGNOSIS — Z1231 Encounter for screening mammogram for malignant neoplasm of breast: Secondary | ICD-10-CM

## 2021-07-21 ENCOUNTER — Other Ambulatory Visit: Payer: Self-pay | Admitting: Neurological Surgery

## 2021-07-21 DIAGNOSIS — D352 Benign neoplasm of pituitary gland: Secondary | ICD-10-CM

## 2021-07-25 ENCOUNTER — Ambulatory Visit
Admission: RE | Admit: 2021-07-25 | Discharge: 2021-07-25 | Disposition: A | Discharge status: Home / Self Care | Payer: Medicare HMO | Source: Ambulatory Visit | Attending: Family Medicine | Admitting: Family Medicine

## 2021-07-25 DIAGNOSIS — Z1231 Encounter for screening mammogram for malignant neoplasm of breast: Secondary | ICD-10-CM

## 2021-08-25 ENCOUNTER — Ambulatory Visit
Admission: RE | Admit: 2021-08-25 | Discharge: 2021-08-25 | Disposition: A | Discharge status: Home / Self Care | Payer: Medicare HMO | Source: Ambulatory Visit | Attending: Neurological Surgery | Admitting: Neurological Surgery

## 2021-08-25 ENCOUNTER — Encounter: Payer: Self-pay | Admitting: Internal Medicine

## 2021-08-25 ENCOUNTER — Ambulatory Visit (INDEPENDENT_AMBULATORY_CARE_PROVIDER_SITE_OTHER): Payer: Medicare HMO | Admitting: Internal Medicine

## 2021-08-25 VITALS — BP 130/70 | HR 67 | Ht 63.0 in | Wt 196.2 lb

## 2021-08-25 DIAGNOSIS — D352 Benign neoplasm of pituitary gland: Secondary | ICD-10-CM | POA: Diagnosis not present

## 2021-08-25 DIAGNOSIS — E213 Hyperparathyroidism, unspecified: Secondary | ICD-10-CM

## 2021-08-25 MED ORDER — GADOBENATE DIMEGLUMINE 529 MG/ML IV SOLN
10.0000 mL | Freq: Once | INTRAVENOUS | Status: AC | PRN
  Administered 2021-08-25: 10 mL via INTRAVENOUS

## 2021-08-25 NOTE — Progress Notes (Signed)
Name: Elaine Burke  MRN/ DOB: [id number], 1950-07-05    Age/ Sex: 71 y.o., female     PCP: Fanny Bien, MD   Reason for Endocrinology Evaluation: Pituitary adenoma      Initial Endocrinology Clinic Visit: 07/18/2019    PATIENT IDENTIFIER: Elaine Burke is a 71 y.o., female with a past medical history of pituitary macroadenoma. She has followed with [facility] since 07/18/2019 for consultative assistance with management of her pituitary adenoma .   HISTORICAL SUMMARY: The patient was first diagnosed with 1.5 cm pituitary macroadenoma in 2018 during evaluation for unsteady gait.  Historically hormonal levels and pituitary imaging has been stable  SUBJECTIVE:     Today (08/25/2021):  Elaine Burke is here for a follow-up on pituitary macroadenoma.  No prior radiation or brain sx Last eye exam 08/2020  MRI done today  No prior with DM  Denies palpitations  Has noted weight gain  Denies diarrhea or constipation  Denies change in shoe size but has noted difficulty removing rings today due to swelling  Denies headaches  Denies vision changes  Has HTN - well controlled  Has hx of hypokalemia , on Kcl    Has hypercalcemia, has been off HCTZ  No prior dx of osteoporosis  She is not on calcium nor MVI  She is on Vitamin D 5000 iu daily         HISTORY:  Past Medical History:  Past Medical History:  Diagnosis Date   Arthritis    Arthritis    Diabetes mellitus without complication (HCC)    HLD (hyperlipidemia)    Hypertension    Past Surgical History:  Past Surgical History:  Procedure Laterality Date   ABDOMINAL HYSTERECTOMY     CESAREAN SECTION     COLONOSCOPY     Social History:  reports that she has never smoked. She has never used smokeless tobacco. She reports that she does not drink alcohol and does not use drugs. Family History:  Family History  Problem Relation Age of Onset   Breast cancer Paternal Aunt     Other Neg Hx        pituitary abnormality     HOME MEDICATIONS: Allergies as of 08/25/2021       Reactions   Shellfish Allergy Swelling   Shellfish Allergy Swelling   Lisinopril Other (See Comments)   MD said affects kidneys. No take    Penicillins Hives, Itching, Swelling   Has patient had a PCN reaction causing immediate rash, facial/tongue/throat swelling, SOB or lightheadedness with hypotension: No Has patient had a PCN reaction causing severe rash involving mucus membranes or skin necrosis: No Has patient had a PCN reaction that required hospitalization: No Has patient had a PCN reaction occurring within the last 10 years: No If all of the above answers are "NO", then may proceed with Cephalosporin use.        Medication List        Accurate as of August 25, 2021  4:46 PM. If you have any questions, ask your nurse or doctor.          STOP taking these medications    amLODipine 10 MG tablet Commonly known as: NORVASC Stopped by: Dorita Sciara, MD   CALCIUM PO Stopped by: Dorita Sciara, MD   furosemide 20 MG tablet Commonly known as: LASIX Stopped by: Dorita Sciara, MD   loratadine 10 MG tablet Commonly known as: CLARITIN Stopped by: Mammie Lorenzo  Verda Cumins, MD       TAKE these medications    carvedilol 12.5 MG tablet Commonly known as: COREG Take 12.5 mg by mouth 2 (two) times daily.   isosorbide-hydrALAZINE 20-37.5 MG tablet Commonly known as: BIDIL Take 0.5 tablets by mouth 3 (three) times daily.   MULTIVITAL PO Take by mouth.   potassium chloride 10 MEQ tablet Commonly known as: KLOR-CON Take 10 mEq by mouth 2 (two) times daily.   rosuvastatin 5 MG tablet Commonly known as: CRESTOR Take 5 mg by mouth daily.   valsartan 320 MG tablet Commonly known as: DIOVAN Take 1 tablet (320 mg total) by mouth daily.   VITAMIN D PO Take 5,000 Units by mouth.          OBJECTIVE:   PHYSICAL EXAM: VS: BP 130/70 (BP  Location: Left Arm, Patient Position: Sitting, Cuff Size: Large)   Pulse 67   Ht '5\' 3"'$  (1.6 m)   Wt 196 lb 3.2 oz (89 kg)   SpO2 98%   BMI 34.76 kg/m    EXAM: General: Pt appears well and is in NAD  Neck: General: Supple without adenopathy. Thyroid: Thyroid size normal.  No goiter or nodules appreciated.   Lungs: Clear with good BS bilat with no rales, rhonchi, or wheezes  Heart: Auscultation: RRR.  Abdomen: Normoactive bowel sounds, soft, nontender, without masses or organomegaly palpable  Extremities:  BL LE: No pretibial edema normal ROM and strength.  Mental Status: Judgment, insight: Intact Orientation: Oriented to time, place, and person Mood and affect: No depression, anxiety, or agitation     DATA REVIEWED:     MRI brain 6/19//2023  Brain: No acute infarct, mass effect or extra-axial collection. No acute or chronic hemorrhage. Normal white matter signal. Generalized volume loss.   Pituitary/Sella: There is an unchanged hypoenhancing mass within the left aspect of the pituitary gland that measures 1.0 cm AP x 1.0 cm TV x 1.2 cm CC . These measurements are unchanged when the prior study is measured at the same level. The infundibulum is midline but shifted upward. There is no mass effect on the optic chiasm or optic nerves. Unchanged left cavernous sinus extension, grade 1 (medial to the median intercarotid line).   Vascular: Major flow voids are preserved.   Skull and upper cervical spine: Normal calvarium and skull base. Visualized upper cervical spine and soft tissues are normal.   Sinuses/Orbits:No paranasal sinus fluid levels or advanced mucosal thickening. No mastoid or middle ear effusion. Normal orbits.   IMPRESSION: Unchanged size and appearance of left-sided pituitary macroadenoma with grade 1 cavernous sinus extension. No mass effect on the optic chiasm or optic nerves.    ASSESSMENT / PLAN / RECOMMENDATIONS:   Pituitary  macroadenoma:   -No local symptoms -Pituitary adenoma seems to be stable at 1.5 cm -MRI today 08/2021 showed unchanged pituitary adenoma ( ordered by Dr. Zada Finders) -Historically no biochemical evidence of hypo or hyperpituitarism -Emphasized the importance of having annual visual field testing through ophthalmology  2. Hypercalcemia:  -This is PTH mediated -She has been off HCTZ which has helped improve her serum calcium -We discussed pathophysiology of primary hyperparathyroidism -We will proceed with 24-hour urine collection -She is not on any calcium supplements -She will continue vitamin D for now -Patient will need further evaluation to determine surgical candidacy -She is unable to proceed with lab work today due to recent intake of contrast.  She will have labs drawn when she drops off 24-hour urine  Follow-up in 6 months  Signed electronically by: Mack Guise, MD  [facility]  [facility] [address] [place], [place] [address] Phone: [phone] FAX: [phone]      CC: Fanny Bien, MD [address] [place] [address] Phone: [phone]  Fax: [phone]   Return to Endocrinology clinic as below: Future Appointments  Date Time Provider [place]  02/23/2022 11:50 AM Derick Seminara, Melanie Crazier, MD [facility] None

## 2021-08-25 NOTE — Patient Instructions (Signed)

## 2021-08-29 ENCOUNTER — Other Ambulatory Visit (INDEPENDENT_AMBULATORY_CARE_PROVIDER_SITE_OTHER): Payer: Medicare HMO

## 2021-08-29 ENCOUNTER — Ambulatory Visit (INDEPENDENT_AMBULATORY_CARE_PROVIDER_SITE_OTHER)
Admission: RE | Admit: 2021-08-29 | Discharge: 2021-08-29 | Disposition: A | Discharge status: Home / Self Care | Payer: Medicare HMO | Source: Ambulatory Visit | Attending: Internal Medicine | Admitting: Internal Medicine

## 2021-08-29 DIAGNOSIS — D352 Benign neoplasm of pituitary gland: Secondary | ICD-10-CM | POA: Diagnosis not present

## 2021-08-29 DIAGNOSIS — E213 Hyperparathyroidism, unspecified: Secondary | ICD-10-CM

## 2021-08-29 LAB — FOLLICLE STIMULATING HORMONE: FSH: 46.9 m[IU]/mL

## 2021-08-29 LAB — BASIC METABOLIC PANEL
BUN: 12 mg/dL (ref 6–23)
CO2: 28 mEq/L (ref 19–32)
Calcium: 10.4 mg/dL (ref 8.4–10.5)
Chloride: 105 mEq/L (ref 96–112)
Creatinine, Ser: 0.87 mg/dL (ref 0.40–1.20)
GFR: 67.83 mL/min (ref >60.00)
Glucose, Bld: 130 mg/dL — ABNORMAL HIGH (ref 70–99)
Potassium: 3.9 mEq/L (ref 3.5–5.1)
Sodium: 138 mEq/L (ref 135–145)

## 2021-08-29 LAB — CORTISOL: Cortisol, Plasma: 9.6 ug/dL

## 2021-08-29 LAB — ALBUMIN: Albumin: 3.8 g/dL (ref 3.5–5.2)

## 2021-08-29 LAB — VITAMIN D 25 HYDROXY (VIT D DEFICIENCY, FRACTURES): VITD: 40.22 ng/mL (ref 30.00–100.00)

## 2021-08-29 LAB — TSH: TSH: 2.57 u[IU]/mL (ref 0.35–5.50)

## 2021-08-29 LAB — T4, FREE: Free T4: 0.66 ng/dL (ref 0.60–1.60)

## 2021-08-30 LAB — CALCIUM, URINE, 24 HOUR: Calcium, 24H Urine: 119 mg/24 h

## 2021-08-30 LAB — CREATININE, URINE, 24 HOUR: Creatinine, 24H Ur: 1.12 g/(24.h) (ref 0.50–2.15)

## 2021-09-01 LAB — ACTH: C206 ACTH: 22 pg/mL (ref 6–50)

## 2021-09-05 LAB — PARATHYROID HORMONE, INTACT (NO CA): PTH: 38 pg/mL (ref 16–77)

## 2021-09-05 LAB — INSULIN-LIKE GROWTH FACTOR
IGF-I, LC/MS: 98 ng/mL (ref 41–279)
Z-Score (Female): -0.2 SD (ref ?–2.0)

## 2021-09-05 LAB — CALCIUM, IONIZED: Calcium, Ion: 5.5 mg/dL (ref 4.7–5.5)

## 2021-09-05 LAB — PROLACTIN: Prolactin: 23.7 ng/mL

## 2022-02-23 ENCOUNTER — Encounter: Payer: Self-pay | Admitting: Internal Medicine

## 2022-02-23 ENCOUNTER — Ambulatory Visit (INDEPENDENT_AMBULATORY_CARE_PROVIDER_SITE_OTHER): Payer: Medicare HMO | Admitting: Internal Medicine

## 2022-02-23 VITALS — BP 118/76 | HR 76 | Ht 63.0 in | Wt 194.0 lb

## 2022-02-23 DIAGNOSIS — D352 Benign neoplasm of pituitary gland: Secondary | ICD-10-CM

## 2022-02-23 DIAGNOSIS — E213 Hyperparathyroidism, unspecified: Secondary | ICD-10-CM | POA: Diagnosis not present

## 2022-02-23 LAB — BASIC METABOLIC PANEL
BUN: 10 mg/dL (ref 6–23)
CO2: 28 mEq/L (ref 19–32)
Calcium: 10.7 mg/dL — ABNORMAL HIGH (ref 8.4–10.5)
Chloride: 106 mEq/L (ref 96–112)
Creatinine, Ser: 0.95 mg/dL (ref 0.40–1.20)
GFR: 60.83 mL/min (ref >60.00)
Glucose, Bld: 129 mg/dL — ABNORMAL HIGH (ref 70–99)
Potassium: 4.1 mEq/L (ref 3.5–5.1)
Sodium: 140 mEq/L (ref 135–145)

## 2022-02-23 LAB — VITAMIN D 25 HYDROXY (VIT D DEFICIENCY, FRACTURES): VITD: 47.2 ng/mL (ref 30.00–100.00)

## 2022-02-23 LAB — TSH: TSH: 1.63 u[IU]/mL (ref 0.35–5.50)

## 2022-02-23 LAB — T4, FREE: Free T4: 0.62 ng/dL (ref 0.60–1.60)

## 2022-02-23 LAB — ALBUMIN: Albumin: 4 g/dL (ref 3.5–5.2)

## 2022-02-23 NOTE — Progress Notes (Unsigned)
Name: Elaine Burke  MRN/ DOB: [id number], 09/04/1950    Age/ Sex: 71 y.o., female     PCP: Fanny Bien, MD   Reason for Endocrinology Evaluation: Pituitary adenoma      Initial Endocrinology Clinic Visit: 07/18/2019    PATIENT IDENTIFIER: Elaine Burke is a 71 y.o., female with a past medical history of pituitary macroadenoma. She has followed with [facility] since 07/18/2019 for consultative assistance with management of her pituitary adenoma .   HISTORICAL SUMMARY: The patient was first diagnosed with 1.5 cm pituitary macroadenoma in 2018 during evaluation for unsteady gait.  Historically hormonal levels and pituitary imaging has been stable  No prior exposure to XRT   HYPERPARATHYROIDISM HISTORY: Patient has been noted with hypercalcemia since 2018, with a max level of 11.5 mg/dL (uncorrected) on 02/19/2021, with inappropriately normal mall PTH with a max level of 61 PG/mL 02/19/2021  Serum calcium improved with discontinuation of HCTZ  DXA - low bone  density 08/2021 24 hr urine calcium 119 (08/2021)   SUBJECTIVE:     Today (02/23/2022):  Elaine Burke is here for a follow-up on pituitary macroadenoma.   Last eye exam 08/2020 , none this year yet  Denies change in vision  Denies headaches  Denies palpitations  Denies diarrhea or constipation  Continues with occasional ankle swelling  Denies polydipsia   She is on Vitamin D 5000 iu daily         HISTORY:  Past Medical History:  Past Medical History:  Diagnosis Date   Arthritis    Arthritis    Diabetes mellitus without complication (HCC)    HLD (hyperlipidemia)    Hypertension    Past Surgical History:  Past Surgical History:  Procedure Laterality Date   ABDOMINAL HYSTERECTOMY     CESAREAN SECTION     COLONOSCOPY     Social History:  reports that she has never smoked. She has never used smokeless tobacco. She reports that she does not drink alcohol and  does not use drugs. Family History:  Family History  Problem Relation Age of Onset   Breast cancer Paternal Aunt    Other Neg Hx        pituitary abnormality     HOME MEDICATIONS: Allergies as of 02/23/2022       Reactions   Shellfish Allergy Swelling   Shellfish Allergy Swelling   Lisinopril Other (See Comments)   MD said affects kidneys. No take    Penicillins Hives, Itching, Swelling   Has patient had a PCN reaction causing immediate rash, facial/tongue/throat swelling, SOB or lightheadedness with hypotension: No Has patient had a PCN reaction causing severe rash involving mucus membranes or skin necrosis: No Has patient had a PCN reaction that required hospitalization: No Has patient had a PCN reaction occurring within the last 10 years: No If all of the above answers are "NO", then may proceed with Cephalosporin use.        Medication List        Accurate as of February 23, 2022 12:03 PM. If you have any questions, ask your nurse or doctor.          STOP taking these medications    MULTIVITAL PO Stopped by: Dorita Sciara, MD       TAKE these medications    carvedilol 12.5 MG tablet Commonly known as: COREG Take 12.5 mg by mouth 2 (two) times daily.   isosorbide-hydrALAZINE 20-37.5 MG tablet Commonly known as:  BIDIL Take 0.5 tablets by mouth 3 (three) times daily.   potassium chloride 10 MEQ tablet Commonly known as: KLOR-CON Take 10 mEq by mouth 2 (two) times daily.   rosuvastatin 5 MG tablet Commonly known as: CRESTOR Take 5 mg by mouth daily.   valsartan 320 MG tablet Commonly known as: DIOVAN Take 1 tablet (320 mg total) by mouth daily.   VITAMIN D PO Take 5,000 Units by mouth.          OBJECTIVE:   PHYSICAL EXAM: VS: BP 118/76 (BP Location: Left Arm, Patient Position: Sitting, Cuff Size: Large)   Pulse 76   Ht '5\' 3"'$  (1.6 m)   Wt 194 lb (88 kg)   SpO2 99%   BMI 34.37 kg/m    EXAM: General: Pt appears well and is  in NAD  Neck: General: Supple without adenopathy. Thyroid: Thyroid size normal.  No goiter or nodules appreciated.   Lungs: Clear with good BS bilat   Heart: Auscultation: RRR.  Abdomen: Normoactive bowel sounds, soft, nontender, without masses or organomegaly palpable  Extremities:  BL LE: trace  pretibial edema   Mental Status: Judgment, insight: Intact Orientation: Oriented to time, place, and person Mood and affect: No depression, anxiety, or agitation     DATA REVIEWED:    Latest Reference Range & Units 08/29/21 10:42 08/29/21 11:26  C206 ACTH 6 - 50 pg/mL  22  Cortisol, Plasma ug/dL 9.6   FSH mIU/ML 46.9   Prolactin ng/mL 23.7   PTH, Intact 16 - 77 pg/mL 38   TSH 0.35 - 5.50 uIU/mL 2.57   T4,Free(Direct) 0.60 - 1.60 ng/dL 0.66     Latest Reference Range & Units 08/29/21 10:42  IGF-I, LC/MS 41 - 279 ng/mL 98     DXA 08/29/2021   Results:   Lumbar spine L1-L4 Femoral neck (FN) 33% distal radius  T-score  -0.1 RFN: -1.4 LFN: -1.7 -1.4       MRI brain 6/19//2023  Brain: No acute infarct, mass effect or extra-axial collection. No acute or chronic hemorrhage. Normal white matter signal. Generalized volume loss.   Pituitary/Sella: There is an unchanged hypoenhancing mass within the left aspect of the pituitary gland that measures 1.0 cm AP x 1.0 cm TV x 1.2 cm CC . These measurements are unchanged when the prior study is measured at the same level. The infundibulum is midline but shifted upward. There is no mass effect on the optic chiasm or optic nerves. Unchanged left cavernous sinus extension, grade 1 (medial to the median intercarotid line).   Vascular: Major flow voids are preserved.   Skull and upper cervical spine: Normal calvarium and skull base. Visualized upper cervical spine and soft tissues are normal.   Sinuses/Orbits:No paranasal sinus fluid levels or advanced mucosal thickening. No mastoid or middle ear effusion. Normal orbits.    IMPRESSION: Unchanged size and appearance of left-sided pituitary macroadenoma with grade 1 cavernous sinus extension. No mass effect on the optic chiasm or optic nerves.    ASSESSMENT / PLAN / RECOMMENDATIONS:   Pituitary macroadenoma:   -No local symptoms -Pituitary adenoma stable at 1.5 cm -MRI 08/2021 showed unchanged pituitary adenoma ( ordered by Dr. Zada Finders), per pt will f/u every 3 yrs with him  -Historically no biochemical evidence of hypo or hyperpituitarism -Emphasized the importance of having annual visual field testing through ophthalmology -ACTH, cortisol, [place], prolactin, TFTs, and IGF-1 level came back normal (08/2021) - Pt encouraged to schedule an eye exam for visual field testing  2. Hyperparathyroidism:  -This is PTH mediated -She has been off HCTZ which has helped improve her serum calcium - DXA - low bone density 08/2021 - 24 hr urine calcium normal 119 mg  - no indication for surgical intervention     Recommendations  Stay hydrated  Avoid OTC calcium  Consume 2-3 dietary calcium daily   Follow-up in 6 months  Signed electronically by: Mack Guise, MD  [facility]  [facility] [address] [place], [place] [address] Phone: [phone] FAX: [phone]      CC: Fanny Bien, MD [address] [place] [address] Phone: [phone]  Fax: [phone]   Return to Endocrinology clinic as below: No future appointments.

## 2022-02-23 NOTE — Patient Instructions (Addendum)
Please have a visual field testing through your eye doctor    Stay Hydrated  Avoid over the counter calcium tablets  Make sure you are eating 2-3 servings of dietary calcium daily ( low fat dairy, green leafy vegetables)

## 2022-02-24 LAB — PARATHYROID HORMONE, INTACT (NO CA): PTH: 63 pg/mL (ref 16–77)

## 2022-06-22 ENCOUNTER — Encounter (HOSPITAL_BASED_OUTPATIENT_CLINIC_OR_DEPARTMENT_OTHER): Payer: Self-pay | Admitting: Emergency Medicine

## 2022-06-22 ENCOUNTER — Other Ambulatory Visit: Payer: Self-pay

## 2022-06-22 ENCOUNTER — Emergency Department (HOSPITAL_BASED_OUTPATIENT_CLINIC_OR_DEPARTMENT_OTHER): Payer: Medicare HMO | Admitting: Radiology

## 2022-06-22 ENCOUNTER — Emergency Department (HOSPITAL_BASED_OUTPATIENT_CLINIC_OR_DEPARTMENT_OTHER)
Admission: EM | Admit: 2022-06-22 | Discharge: 2022-06-22 | Disposition: A | Discharge status: Home / Self Care | Payer: Medicare HMO | Attending: Emergency Medicine | Admitting: Emergency Medicine

## 2022-06-22 DIAGNOSIS — E119 Type 2 diabetes mellitus without complications: Secondary | ICD-10-CM | POA: Insufficient documentation

## 2022-06-22 DIAGNOSIS — I10 Essential (primary) hypertension: Secondary | ICD-10-CM | POA: Diagnosis not present

## 2022-06-22 DIAGNOSIS — W19XXXA Unspecified fall, initial encounter: Secondary | ICD-10-CM | POA: Insufficient documentation

## 2022-06-22 DIAGNOSIS — S82851A Displaced trimalleolar fracture of right lower leg, initial encounter for closed fracture: Secondary | ICD-10-CM | POA: Diagnosis not present

## 2022-06-22 DIAGNOSIS — Z79899 Other long term (current) drug therapy: Secondary | ICD-10-CM | POA: Diagnosis not present

## 2022-06-22 DIAGNOSIS — M25571 Pain in right ankle and joints of right foot: Secondary | ICD-10-CM | POA: Diagnosis present

## 2022-06-22 MED ORDER — FENTANYL CITRATE PF 50 MCG/ML IJ SOSY
50.0000 ug | PREFILLED_SYRINGE | Freq: Once | INTRAMUSCULAR | Status: AC
  Administered 2022-06-22: 50 ug via INTRAMUSCULAR
  Filled 2022-06-22: qty 1

## 2022-06-22 MED ORDER — OXYCODONE HCL 5 MG PO TABS: 5.0000 mg | ORAL_TABLET | ORAL | 0 refills | Status: DC | PRN

## 2022-06-22 NOTE — ED Notes (Signed)
Pt uses a walker at home. Pt refused walker and crutches. Dr. Dalene Seltzer informed.

## 2022-06-22 NOTE — Discharge Instructions (Addendum)
For your pain, you may take up to 1000mg  of acetaminophen (tylenol) 4 times daily for up to a week. This is the maximum dose of acetminophen (tylenol) you can take from all sources. Please check other over-the-counter medications and prescriptions to ensure you are not taking other medications that contain acetaminophen.  You may also take ibuprofen 400 mg 6 times a day OR 600mg  4 times a day alternating with or at the same time as tylenol. (OR instead of ibuprofen take aleve/naproxen twice a day) Take oxycodone as needed for breakthrough pain.  This medication can be addicting, sedating and cause constipation.

## 2022-06-22 NOTE — ED Triage Notes (Signed)
Pt arrived via [facility] from home. Pt c/o R ankle pain after mechanical fall this morning at 0630. Pt denies hitting head, LOC, and does not take blood thinner meds. EMS advised pt was hypertensive, however hasn't taken home HTN meds yet this AM. Swelling to R ankle and R foot.    EMS VS BP 188/98 HR 68  SpO2 95% Pain 2/10  Took Aleve at 0730

## 2022-06-22 NOTE — ED Notes (Signed)
Discharge instructions, pain management, and follow up care reviewed and explained, pt verbalized understanding and had no further questions on d/c. Pt taken to POV via wheelchair and assisted into husband's POV without incident.

## 2022-06-22 NOTE — ED Provider Notes (Signed)
[place] EMERGENCY DEPARTMENT AT [facility] Provider Note   CSN: [id number] Arrival date & time: 06/22/22  1021     History  Chief Complaint  Patient presents with   Ankle Pain    Elaine Burke is a 72 y.o. female.  HPI      72 year old female with a history of hypertension, hyperlipidemia, diabetes, arthritis, presents with concern for fall with ankle pain.   Reports he had a mechanical fall this morning at 630.  She did not hit her head, no loss of consciousness, is not on blood thinners.  She has swelling to her right foot and ankle.  Reports she has worked with PT because of right sided weakness (had negative work up for stroke/ms per pt) for years and that she has had right sided knee pain. Today she had this pain in her knee and it gave out on her and she twisted her ankle and fell. No LOC< head trauma, headache, neck pain, back pain, chest pain, abdominal pain, dyspnea, nausea, vomiting, new numbness/weakness, vision changes. Pain 7/10 with weightbearing, otherwise minimal with rest.    Past Medical History:  Diagnosis Date   Arthritis    Arthritis    Diabetes mellitus without complication    HLD (hyperlipidemia)    Hypertension     Home Medications Prior to Admission medications   Medication Sig Start Date End Date Taking? Authorizing Provider  oxyCODONE (ROXICODONE) 5 MG immediate release tablet Take 1 tablet (5 mg total) by mouth every 4 (four) hours as needed for severe pain. 06/22/22  Yes Alvira Monday, MD  carvedilol (COREG) 12.5 MG tablet Take 12.5 mg by mouth 2 (two) times daily. 08/14/21   [provider]  isosorbide-hydrALAZINE (BIDIL) 20-37.5 MG tablet Take 0.5 tablets by mouth 3 (three) times daily. 04/23/21   Romero Belling, MD  potassium chloride (KLOR-CON) 10 MEQ tablet Take 10 mEq by mouth 2 (two) times daily. 06/14/19   [provider]  rosuvastatin (CRESTOR) 5 MG tablet Take 5 mg by mouth daily. 06/14/19   [provider]  valsartan (DIOVAN) 320 MG tablet Take 1 tablet (320 mg total) by mouth daily. 07/14/21   Romero Belling, MD  VITAMIN D PO Take 5,000 Units by mouth.    [provider]      Allergies    Shellfish allergy, Shellfish allergy, Lisinopril, and Penicillins    Review of Systems   Review of Systems  Physical Exam Updated Vital Signs BP (!) 158/71 (BP Location: Right Arm)   Pulse 75   Temp 98.7 F (37.1 C) (Oral)   Resp 14   Ht  (1.6 m)   Wt 86.2 kg   SpO2 98%   BMI 33.66 kg/m  Physical Exam Vitals and nursing note reviewed.  Constitutional:      General: She is not in acute distress.    Appearance: She is well-developed. She is not diaphoretic.  HENT:     Head: Normocephalic and atraumatic.  Eyes:     Conjunctiva/sclera: Conjunctivae normal.  Cardiovascular:     Rate and Rhythm: Normal rate and regular rhythm.     Heart sounds: Normal heart sounds. No murmur heard.    No friction rub. No gallop.  Pulmonary:     Effort: Pulmonary effort is normal. No respiratory distress.     Breath sounds: Normal breath sounds. No wheezing or rales.  Abdominal:     General: There is no distension.     Palpations: Abdomen  is soft.     Tenderness: There is no abdominal tenderness. There is no guarding.  Musculoskeletal:        General: Swelling and tenderness present.     Cervical back: Normal range of motion.  Skin:    General: Skin is warm and dry.     Findings: No erythema or rash.  Neurological:     Mental Status: She is alert and oriented to person, place, and time.     ED Results / Procedures / Treatments   Labs (all labs ordered are listed, but only abnormal results are displayed) Labs Reviewed - No data to display  EKG None  Radiology DG Ankle Complete Right  Result Date: 06/22/2022 CLINICAL DATA:  Right ankle pain after mechanical fall this morning. EXAM: RIGHT ANKLE - COMPLETE 3+ VIEW; RIGHT FOOT COMPLETE - 3+ VIEW COMPARISON:  None  Available. FINDINGS: Right ankle: There is a comminuted displaced fracture of the medial malleolus. There is also spiral fracture of the distal fibula above the level of the syndesmotic ligament. There is also mildly displaced fracture of the posterior aspect of the tibia. There is mild lateral displacement of the talus relative to the tibial plafond. There is marked soft tissue swelling about the medial and lateral aspect of the ankle. Right foot: There is no evidence of fracture, dislocation, or joint effusion. Small plantar calcaneal spurring and mild Achilles enthesopathy. Os peroneum incidentally noted. There is no evidence of arthropathy or other focal bone abnormality. Marked soft tissue swelling about the dorsum of the foot. IMPRESSION: 1. Trimalleolar fracture of the right ankle with mild lateral displacement of the talus relative to the tibial plafond. 2. Marked soft tissue swelling about the ankle and dorsum of the foot. Electronically Signed   By: Larose Hires D.O.   On: 06/22/2022 11:27   DG Foot Complete Right  Result Date: 06/22/2022 CLINICAL DATA:  Right ankle pain after mechanical fall this morning. EXAM: RIGHT ANKLE - COMPLETE 3+ VIEW; RIGHT FOOT COMPLETE - 3+ VIEW COMPARISON:  None Available. FINDINGS: Right ankle: There is a comminuted displaced fracture of the medial malleolus. There is also spiral fracture of the distal fibula above the level of the syndesmotic ligament. There is also mildly displaced fracture of the posterior aspect of the tibia. There is mild lateral displacement of the talus relative to the tibial plafond. There is marked soft tissue swelling about the medial and lateral aspect of the ankle. Right foot: There is no evidence of fracture, dislocation, or joint effusion. Small plantar calcaneal spurring and mild Achilles enthesopathy. Os peroneum incidentally noted. There is no evidence of arthropathy or other focal bone abnormality. Marked soft tissue swelling about the  dorsum of the foot. IMPRESSION: 1. Trimalleolar fracture of the right ankle with mild lateral displacement of the talus relative to the tibial plafond. 2. Marked soft tissue swelling about the ankle and dorsum of the foot. Electronically Signed   By: Larose Hires D.O.   On: 06/22/2022 11:27    Procedures Procedures    Medications Ordered in ED Medications  fentaNYL (SUBLIMAZE) injection 50 mcg (50 mcg Intramuscular Given 06/22/22 1147)  fentaNYL (SUBLIMAZE) injection 50 mcg (50 mcg Intramuscular Given 06/22/22 1402)    ED Course/ Medical Decision Making/ A&P                               72 year old female with a history of hypertension, hyperlipidemia, diabetes, arthritis,  presents with concern for fall with ankle pain.    Fall related to underlying knee and right sided pathology, no acute illness, lightheadedness, syncope or other concerns.  No head trauma, no anticoagulation, low suspicion for ICH. Low suspicoin for other cervical, thoracic, lumbar, intrathoracic and intraabdominal injuries.  Isolated ankle pain.  XR shows trimalleolar facture. IT is a closed fracture, she is NV intact.  No signs of compartment syndrome.  Initially discussed with Dr. Susa Simmonds with plan for follow up Wednesday however she typically sees Dr. Charlann Boxer with [facility] and discussed with Josie Dixon she may call office for appointment with Dr. Charlann Boxer.   Recommend NWB. Given rx for wheelchair.  Discussed risks of narcotics and gave rx for oxycodone in addition to ibuprofen/tylenol. Patient discharged in stable condition with understanding of reasons to return.         Final Clinical Impression(s) / ED Diagnoses Final diagnoses:  Closed trimalleolar fracture of right ankle, initial encounter    Rx / DC Orders ED Discharge Orders          Ordered    For home use only DME standard manual wheelchair with seat cushion       Comments: Patient suffers from an ankle fracture which impairs their ability to perform  daily activities like bathing, dressing, feeding, grooming, and toileting in the home.  A cane, crutch, or walker will not resolve issue with performing activities of daily living. A wheelchair will allow patient to safely perform daily activities. Patient can safely propel the wheelchair in the home or has a caregiver who can provide assistance. Length of need 6 months . Accessories: elevating leg rests (ELRs), wheel locks, extensions and anti-tippers.   06/22/22 1319    oxyCODONE (ROXICODONE) 5 MG immediate release tablet  Every 4 hours PRN        06/22/22 1324              Alvira Monday, MD 06/22/22 2205

## 2022-06-25 ENCOUNTER — Other Ambulatory Visit: Payer: Self-pay | Admitting: Orthopaedic Surgery

## 2022-07-01 ENCOUNTER — Encounter (HOSPITAL_COMMUNITY): Payer: Self-pay | Admitting: Orthopaedic Surgery

## 2022-07-01 ENCOUNTER — Other Ambulatory Visit: Payer: Self-pay

## 2022-07-01 NOTE — Progress Notes (Signed)
PCP - Lewis Moccasin, MD  Cardiologist - denies  PPM/ICD - denies  EKG - 07/02/22  CPAP - n/a  Fasting Blood Sugar - patient is not checking CBG at home.  Blood Thinner Instructions: n/a Patient was instructed: As of today, STOP taking any Aspirin (unless otherwise instructed by your surgeon) Aleve, Naproxen, Ibuprofen, Motrin, Advil, Goody's, BC's, all herbal medications, fish oil, and all vitamins.  ERAS Protcol - yes, until 11:00 o'clock  COVID TEST- n/a  Anesthesia review: no  Patient verbally denies any shortness of breath, fever, cough and chest pain during phone call   -------------  SDW INSTRUCTIONS given:  Your procedure is scheduled on Thursday, April 25th, 2024.  Report to [facility] Main Entrance "A" at 11:30 A.M., and check in at the Admitting office.  Call this number if you have problems the morning of surgery:  [phone]   Remember:  Do not eat after midnight the night before your surgery  You may drink clear liquids until 11:00 the morning of your surgery.   Clear liquids allowed are: Water, Non-Citrus Juices (without pulp), Carbonated Beverages, Clear Tea, Black Coffee Only, and Gatorade    Take these medicines the morning of surgery with A SIP OF WATER: Coreg, Bidil, Crestor PRN: Tylenol   The day of surgery:                     Do not wear jewelry, make up, or nail polish            Do not wear lotions, powders, perfumes, or deodorant.            Do not shave 48 hours prior to surgery.              Do not bring valuables to the hospital.            [facility] is not responsible for any belongings or valuables.  Do NOT Smoke (Tobacco/Vaping) 24 hours prior to your procedure If you use a CPAP at night, you may bring all equipment for your overnight stay.   Contacts, glasses, dentures or bridgework may not be worn into surgery.      For patients admitted to the hospital, discharge time will be determined by your treatment team.    Patients discharged the day of surgery will not be allowed to drive home, and someone needs to stay with them for 24 hours.    Special instructions:   [facility]- Preparing For Surgery  Before surgery, you can play an important role. Because skin is not sterile, your skin needs to be as free of germs as possible. You can reduce the number of germs on your skin by washing with CHG (chlorahexidine gluconate) Soap before surgery.  CHG is an antiseptic cleaner which kills germs and bonds with the skin to continue killing germs even after washing.    Oral Hygiene is also important to reduce your risk of infection.  Remember - BRUSH YOUR TEETH THE MORNING OF SURGERY WITH YOUR REGULAR TOOTHPASTE  Please do not use if you have an allergy to CHG or antibacterial soaps. If your skin becomes reddened/irritated stop using the CHG.  Do not shave (including legs and underarms) for at least 48 hours prior to first CHG shower. It is OK to shave your face.  Please follow these instructions carefully.   Shower the NIGHT BEFORE SURGERY and the MORNING OF SURGERY with DIAL Soap.   Pat yourself dry with a  CLEAN TOWEL.  Wear CLEAN PAJAMAS to bed the night before surgery  Place CLEAN SHEETS on your bed the night of your first shower and DO NOT SLEEP WITH PETS.   Day of Surgery: Please shower morning of surgery  Wear Clean/Comfortable clothing the morning of surgery Do not apply any deodorants/lotions.   Remember to brush your teeth WITH YOUR REGULAR TOOTHPASTE.   Questions were answered. Patient verbalized understanding of instructions.

## 2022-07-02 ENCOUNTER — Encounter (HOSPITAL_COMMUNITY)
Admission: RE | Disposition: A | Discharge status: Home / Self Care | Payer: Self-pay | Source: Home / Self Care | Attending: Orthopaedic Surgery

## 2022-07-02 ENCOUNTER — Ambulatory Visit (HOSPITAL_COMMUNITY): Payer: Medicare HMO

## 2022-07-02 ENCOUNTER — Ambulatory Visit (HOSPITAL_BASED_OUTPATIENT_CLINIC_OR_DEPARTMENT_OTHER): Payer: Medicare HMO | Admitting: Anesthesiology

## 2022-07-02 ENCOUNTER — Other Ambulatory Visit: Payer: Self-pay

## 2022-07-02 ENCOUNTER — Ambulatory Visit (HOSPITAL_COMMUNITY): Payer: Medicare HMO | Admitting: Anesthesiology

## 2022-07-02 ENCOUNTER — Ambulatory Visit (HOSPITAL_COMMUNITY)
Admission: RE | Admit: 2022-07-02 | Discharge: 2022-07-04 | Disposition: A | Discharge status: Home / Self Care | Payer: Medicare HMO | Attending: Orthopaedic Surgery | Admitting: Orthopaedic Surgery

## 2022-07-02 ENCOUNTER — Encounter (HOSPITAL_COMMUNITY): Payer: Self-pay | Admitting: Orthopaedic Surgery

## 2022-07-02 DIAGNOSIS — W19XXXA Unspecified fall, initial encounter: Secondary | ICD-10-CM | POA: Diagnosis not present

## 2022-07-02 DIAGNOSIS — E119 Type 2 diabetes mellitus without complications: Secondary | ICD-10-CM | POA: Insufficient documentation

## 2022-07-02 DIAGNOSIS — R296 Repeated falls: Secondary | ICD-10-CM | POA: Diagnosis not present

## 2022-07-02 DIAGNOSIS — E785 Hyperlipidemia, unspecified: Secondary | ICD-10-CM | POA: Diagnosis not present

## 2022-07-02 DIAGNOSIS — R2681 Unsteadiness on feet: Secondary | ICD-10-CM | POA: Diagnosis not present

## 2022-07-02 DIAGNOSIS — D649 Anemia, unspecified: Secondary | ICD-10-CM | POA: Diagnosis not present

## 2022-07-02 DIAGNOSIS — M1990 Unspecified osteoarthritis, unspecified site: Secondary | ICD-10-CM | POA: Insufficient documentation

## 2022-07-02 DIAGNOSIS — M6281 Muscle weakness (generalized): Secondary | ICD-10-CM | POA: Diagnosis not present

## 2022-07-02 DIAGNOSIS — Z86018 Personal history of other benign neoplasm: Secondary | ICD-10-CM | POA: Diagnosis not present

## 2022-07-02 DIAGNOSIS — S82851A Displaced trimalleolar fracture of right lower leg, initial encounter for closed fracture: Secondary | ICD-10-CM | POA: Diagnosis not present

## 2022-07-02 DIAGNOSIS — I10 Essential (primary) hypertension: Secondary | ICD-10-CM | POA: Insufficient documentation

## 2022-07-02 DIAGNOSIS — S82851D Displaced trimalleolar fracture of right lower leg, subsequent encounter for closed fracture with routine healing: Secondary | ICD-10-CM

## 2022-07-02 DIAGNOSIS — S82891A Other fracture of right lower leg, initial encounter for closed fracture: Secondary | ICD-10-CM | POA: Diagnosis present

## 2022-07-02 DIAGNOSIS — Z79899 Other long term (current) drug therapy: Secondary | ICD-10-CM | POA: Diagnosis not present

## 2022-07-02 HISTORY — DX: Anemia, unspecified: D64.9

## 2022-07-02 HISTORY — PX: ORIF ANKLE FRACTURE: SHX5408

## 2022-07-02 HISTORY — DX: Benign neoplasm of pituitary gland: D35.2

## 2022-07-02 HISTORY — PX: SYNDESMOSIS REPAIR: SHX5182

## 2022-07-02 LAB — CBC
HCT: 39 % (ref 36.0–46.0)
Hemoglobin: 12.6 g/dL (ref 12.0–15.0)
MCH: 29.4 pg (ref 26.0–34.0)
MCHC: 32.3 g/dL (ref 30.0–36.0)
MCV: 91.1 fL (ref 80.0–100.0)
Platelets: 326 10*3/uL (ref 150–400)
RBC: 4.28 MIL/uL (ref 3.87–5.11)
RDW: 12.8 % (ref 11.5–15.5)
WBC: 8.5 10*3/uL (ref 4.0–10.5)
nRBC: 0 % (ref 0.0–0.2)

## 2022-07-02 LAB — BASIC METABOLIC PANEL
Anion gap: 10 (ref 5–15)
BUN: 15 mg/dL (ref 8–23)
CO2: 21 mmol/L — ABNORMAL LOW (ref 22–32)
Calcium: 9.8 mg/dL (ref 8.9–10.3)
Chloride: 104 mmol/L (ref 98–111)
Creatinine, Ser: 0.89 mg/dL (ref 0.44–1.00)
GFR, Estimated: >60 mL/min (ref >60)
Glucose, Bld: 123 mg/dL — ABNORMAL HIGH (ref 70–99)
Potassium: 3.6 mmol/L (ref 3.5–5.1)
Sodium: 135 mmol/L (ref 135–145)

## 2022-07-02 LAB — GLUCOSE, CAPILLARY
Glucose-Capillary: 109 mg/dL — ABNORMAL HIGH (ref 70–99)
Glucose-Capillary: 117 mg/dL — ABNORMAL HIGH (ref 70–99)

## 2022-07-02 LAB — SURGICAL PCR SCREEN
MRSA, PCR: NEGATIVE
Staphylococcus aureus: NEGATIVE

## 2022-07-02 SURGERY — OPEN REDUCTION INTERNAL FIXATION (ORIF) ANKLE FRACTURE
Anesthesia: General | Site: Ankle | Laterality: Right

## 2022-07-02 MED ORDER — PROPOFOL 10 MG/ML IV BOLUS
INTRAVENOUS | Status: DC | PRN
  Administered 2022-07-02: 150 mg via INTRAVENOUS

## 2022-07-02 MED ORDER — ROPIVACAINE HCL 5 MG/ML IJ SOLN
INTRAMUSCULAR | Status: DC | PRN
  Administered 2022-07-02: 20 mL via EPIDURAL
  Administered 2022-07-02: 30 mL via EPIDURAL

## 2022-07-02 MED ORDER — METHOCARBAMOL 500 MG PO TABS
500.0000 mg | ORAL_TABLET | Freq: Four times a day (QID) | ORAL | Status: DC | PRN
  Administered 2022-07-02 – 2022-07-04 (×3): 500 mg via ORAL
  Filled 2022-07-02 (×3): qty 1

## 2022-07-02 MED ORDER — LIDOCAINE 2% (20 MG/ML) 5 ML SYRINGE
INTRAMUSCULAR | Status: AC
  Filled 2022-07-02: qty 5

## 2022-07-02 MED ORDER — VITAMIN D 25 MCG (1000 UNIT) PO TABS
5000.0000 [IU] | ORAL_TABLET | Freq: Every day | ORAL | Status: DC
  Administered 2022-07-02 – 2022-07-04 (×3): 5000 [IU] via ORAL
  Filled 2022-07-02 (×3): qty 5

## 2022-07-02 MED ORDER — CARVEDILOL 12.5 MG PO TABS: 25.0000 mg | ORAL_TABLET | Freq: Once | ORAL | Status: AC

## 2022-07-02 MED ORDER — LACTATED RINGERS IV SOLN: INTRAVENOUS | Status: DC

## 2022-07-02 MED ORDER — FENTANYL CITRATE (PF) 250 MCG/5ML IJ SOLN
INTRAMUSCULAR | Status: DC | PRN
  Administered 2022-07-02 (×2): 25 ug via INTRAVENOUS

## 2022-07-02 MED ORDER — METOCLOPRAMIDE HCL 5 MG/ML IJ SOLN: 5.0000 mg | Freq: Three times a day (TID) | INTRAMUSCULAR | Status: DC | PRN

## 2022-07-02 MED ORDER — METOCLOPRAMIDE HCL 5 MG PO TABS: 5.0000 mg | ORAL_TABLET | Freq: Three times a day (TID) | ORAL | Status: DC | PRN

## 2022-07-02 MED ORDER — DOCUSATE SODIUM 100 MG PO CAPS
100.0000 mg | ORAL_CAPSULE | Freq: Two times a day (BID) | ORAL | Status: DC
  Administered 2022-07-02 – 2022-07-04 (×4): 100 mg via ORAL
  Filled 2022-07-02 (×4): qty 1

## 2022-07-02 MED ORDER — ACETAMINOPHEN 500 MG PO TABS
ORAL_TABLET | ORAL | Status: AC
  Filled 2022-07-02: qty 2

## 2022-07-02 MED ORDER — OXYCODONE HCL 5 MG PO TABS
10.0000 mg | ORAL_TABLET | ORAL | Status: DC | PRN
  Administered 2022-07-02 – 2022-07-04 (×3): 10 mg via ORAL
  Filled 2022-07-02 (×3): qty 2

## 2022-07-02 MED ORDER — ONDANSETRON HCL 4 MG/2ML IJ SOLN
INTRAMUSCULAR | Status: AC
  Filled 2022-07-02: qty 2

## 2022-07-02 MED ORDER — ISOSORB DINITRATE-HYDRALAZINE 20-37.5 MG PO TABS
0.5000 | ORAL_TABLET | Freq: Three times a day (TID) | ORAL | Status: DC
  Administered 2022-07-02 – 2022-07-04 (×6): 0.5 via ORAL
  Filled 2022-07-02 (×9): qty 0.5

## 2022-07-02 MED ORDER — CEFAZOLIN SODIUM-DEXTROSE 2-4 GM/100ML-% IV SOLN
2.0000 g | Freq: Four times a day (QID) | INTRAVENOUS | Status: AC
  Administered 2022-07-02 – 2022-07-03 (×3): 2 g via INTRAVENOUS
  Filled 2022-07-02 (×3): qty 100

## 2022-07-02 MED ORDER — MEPERIDINE HCL 25 MG/ML IJ SOLN: 6.2500 mg | INTRAMUSCULAR | Status: DC | PRN

## 2022-07-02 MED ORDER — IRBESARTAN 300 MG PO TABS
300.0000 mg | ORAL_TABLET | Freq: Every day | ORAL | Status: DC
  Administered 2022-07-02 – 2022-07-04 (×3): 300 mg via ORAL
  Filled 2022-07-02 (×3): qty 1

## 2022-07-02 MED ORDER — POTASSIUM CHLORIDE CRYS ER 10 MEQ PO TBCR
10.0000 meq | EXTENDED_RELEASE_TABLET | Freq: Every day | ORAL | Status: DC
  Administered 2022-07-02 – 2022-07-04 (×3): 10 meq via ORAL
  Filled 2022-07-02 (×5): qty 1

## 2022-07-02 MED ORDER — CHLORHEXIDINE GLUCONATE 0.12 % MT SOLN
OROMUCOSAL | Status: AC
  Administered 2022-07-02: 15 mL via OROMUCOSAL
  Filled 2022-07-02: qty 15

## 2022-07-02 MED ORDER — ORAL CARE MOUTH RINSE: 15.0000 mL | Freq: Once | OROMUCOSAL | Status: AC

## 2022-07-02 MED ORDER — ACETAMINOPHEN 325 MG PO TABS
650.0000 mg | ORAL_TABLET | Freq: Four times a day (QID) | ORAL | Status: AC
  Administered 2022-07-02 – 2022-07-03 (×4): 650 mg via ORAL
  Filled 2022-07-02 (×4): qty 2

## 2022-07-02 MED ORDER — CARVEDILOL 25 MG PO TABS
25.0000 mg | ORAL_TABLET | Freq: Two times a day (BID) | ORAL | Status: DC
  Administered 2022-07-02 – 2022-07-04 (×4): 25 mg via ORAL
  Filled 2022-07-02 (×4): qty 1

## 2022-07-02 MED ORDER — DEXAMETHASONE SODIUM PHOSPHATE 4 MG/ML IJ SOLN
INTRAMUSCULAR | Status: DC | PRN
  Administered 2022-07-02: 3 mg via PERINEURAL
  Administered 2022-07-02: 2 mg via PERINEURAL

## 2022-07-02 MED ORDER — ROSUVASTATIN CALCIUM 5 MG PO TABS
5.0000 mg | ORAL_TABLET | Freq: Every day | ORAL | Status: DC
  Administered 2022-07-02 – 2022-07-04 (×3): 5 mg via ORAL
  Filled 2022-07-02 (×3): qty 1

## 2022-07-02 MED ORDER — LIDOCAINE 2% (20 MG/ML) 5 ML SYRINGE
INTRAMUSCULAR | Status: DC | PRN
  Administered 2022-07-02: 80 mg via INTRAVENOUS

## 2022-07-02 MED ORDER — CLONIDINE HCL (ANALGESIA) 100 MCG/ML EP SOLN
EPIDURAL | Status: DC | PRN
  Administered 2022-07-02: 40 ug
  Administered 2022-07-02: 60 ug

## 2022-07-02 MED ORDER — EPHEDRINE 5 MG/ML INJ
INTRAVENOUS | Status: AC
  Filled 2022-07-02: qty 5

## 2022-07-02 MED ORDER — AMISULPRIDE (ANTIEMETIC) 5 MG/2ML IV SOLN: 10.0000 mg | Freq: Once | INTRAVENOUS | Status: DC | PRN

## 2022-07-02 MED ORDER — PHENYLEPHRINE HCL-NACL 20-0.9 MG/250ML-% IV SOLN
INTRAVENOUS | Status: DC | PRN
  Administered 2022-07-02: 50 ug/min via INTRAVENOUS

## 2022-07-02 MED ORDER — FENTANYL CITRATE (PF) 100 MCG/2ML IJ SOLN
INTRAMUSCULAR | Status: AC
  Administered 2022-07-02: 100 ug
  Filled 2022-07-02: qty 2

## 2022-07-02 MED ORDER — FENTANYL CITRATE (PF) 100 MCG/2ML IJ SOLN: 25.0000 ug | INTRAMUSCULAR | Status: DC | PRN

## 2022-07-02 MED ORDER — NAPROXEN 250 MG PO TABS
250.0000 mg | ORAL_TABLET | Freq: Two times a day (BID) | ORAL | Status: DC
  Administered 2022-07-03 – 2022-07-04 (×3): 250 mg via ORAL
  Filled 2022-07-02 (×3): qty 1

## 2022-07-02 MED ORDER — MIDAZOLAM HCL 2 MG/2ML IJ SOLN
INTRAMUSCULAR | Status: AC
  Filled 2022-07-02: qty 2

## 2022-07-02 MED ORDER — ONDANSETRON HCL 4 MG/2ML IJ SOLN
INTRAMUSCULAR | Status: DC | PRN
  Administered 2022-07-02: 4 mg via INTRAVENOUS

## 2022-07-02 MED ORDER — ACETAMINOPHEN 500 MG PO TABS
1000.0000 mg | ORAL_TABLET | Freq: Once | ORAL | Status: AC
  Administered 2022-07-02: 1000 mg via ORAL

## 2022-07-02 MED ORDER — ONDANSETRON HCL 4 MG PO TABS: 4.0000 mg | ORAL_TABLET | Freq: Four times a day (QID) | ORAL | Status: DC | PRN

## 2022-07-02 MED ORDER — METHOCARBAMOL 1000 MG/10ML IJ SOLN: 500.0000 mg | Freq: Four times a day (QID) | INTRAVENOUS | Status: DC | PRN

## 2022-07-02 MED ORDER — OXYCODONE HCL 5 MG PO TABS: 5.0000 mg | ORAL_TABLET | ORAL | Status: DC | PRN

## 2022-07-02 MED ORDER — CEFAZOLIN SODIUM-DEXTROSE 2-4 GM/100ML-% IV SOLN
INTRAVENOUS | Status: AC
  Filled 2022-07-02: qty 100

## 2022-07-02 MED ORDER — CHLORHEXIDINE GLUCONATE 0.12 % MT SOLN: 15.0000 mL | Freq: Once | OROMUCOSAL | Status: AC

## 2022-07-02 MED ORDER — EPHEDRINE SULFATE-NACL 50-0.9 MG/10ML-% IV SOSY
PREFILLED_SYRINGE | INTRAVENOUS | Status: DC | PRN
  Administered 2022-07-02: 15 mg via INTRAVENOUS
  Administered 2022-07-02: 10 mg via INTRAVENOUS

## 2022-07-02 MED ORDER — HYDROMORPHONE HCL 1 MG/ML IJ SOLN: 0.5000 mg | INTRAMUSCULAR | Status: DC | PRN

## 2022-07-02 MED ORDER — ONDANSETRON HCL 4 MG/2ML IJ SOLN: 4.0000 mg | Freq: Four times a day (QID) | INTRAMUSCULAR | Status: DC | PRN

## 2022-07-02 MED ORDER — FENTANYL CITRATE (PF) 100 MCG/2ML IJ SOLN: 50.0000 ug | Freq: Once | INTRAMUSCULAR | Status: DC

## 2022-07-02 MED ORDER — CARVEDILOL 12.5 MG PO TABS
ORAL_TABLET | ORAL | Status: AC
  Administered 2022-07-02: 25 mg via ORAL
  Filled 2022-07-02: qty 2

## 2022-07-02 MED ORDER — 0.9 % SODIUM CHLORIDE (POUR BTL) OPTIME
TOPICAL | Status: DC | PRN
  Administered 2022-07-02: 1000 mL

## 2022-07-02 MED ORDER — ACETAMINOPHEN 325 MG PO TABS: 325.0000 mg | ORAL_TABLET | Freq: Four times a day (QID) | ORAL | Status: DC | PRN

## 2022-07-02 MED ORDER — FENTANYL CITRATE (PF) 250 MCG/5ML IJ SOLN
INTRAMUSCULAR | Status: AC
  Filled 2022-07-02: qty 5

## 2022-07-02 MED ORDER — DIPHENHYDRAMINE HCL 12.5 MG/5ML PO ELIX: 12.5000 mg | ORAL_SOLUTION | ORAL | Status: DC | PRN

## 2022-07-02 MED ORDER — POVIDONE-IODINE 10 % EX SWAB
2.0000 | Freq: Once | CUTANEOUS | Status: AC
  Administered 2022-07-02: 2 via TOPICAL

## 2022-07-02 MED ORDER — CEFAZOLIN SODIUM-DEXTROSE 2-4 GM/100ML-% IV SOLN
2.0000 g | INTRAVENOUS | Status: AC
  Administered 2022-07-02: 2 g via INTRAVENOUS

## 2022-07-02 SURGICAL SUPPLY — 74 items
ALCOHOL 70% 16 OZ (MISCELLANEOUS) ×2 IMPLANT
APL PRP STRL LF DISP 70% ISPRP (MISCELLANEOUS) ×4
BAG COUNTER SPONGE SURGICOUNT (BAG) ×2 IMPLANT
BAG SPNG CNTER NS LX DISP (BAG) ×2
BANDAGE ESMARK 6X9 LF (GAUZE/BANDAGES/DRESSINGS) IMPLANT
BIT DRILL 2 CANN GRADUATED (BIT) IMPLANT
BIT DRILL 2.5 CANN STRL (BIT) IMPLANT
BIT DRILL 2.6 CANN (BIT) IMPLANT
BLADE SURG 15 STRL LF DISP TIS (BLADE) ×2 IMPLANT
BLADE SURG 15 STRL SS (BLADE) ×2
BNDG CMPR 5X6 CHSV STRCH STRL (GAUZE/BANDAGES/DRESSINGS)
BNDG CMPR 9X6 STRL LF SNTH (GAUZE/BANDAGES/DRESSINGS)
BNDG CMPR MED 10X6 ELC LF (GAUZE/BANDAGES/DRESSINGS) ×2
BNDG CMPR STD VLCR NS LF 5.8X4 (GAUZE/BANDAGES/DRESSINGS) ×2
BNDG COHESIVE 4X5 TAN STRL (GAUZE/BANDAGES/DRESSINGS) IMPLANT
BNDG COHESIVE 6X5 TAN ST LF (GAUZE/BANDAGES/DRESSINGS) IMPLANT
BNDG ELASTIC 4X5.8 VLCR NS LF (GAUZE/BANDAGES/DRESSINGS) IMPLANT
BNDG ELASTIC 6X10 VLCR STRL LF (GAUZE/BANDAGES/DRESSINGS) ×2 IMPLANT
BNDG ESMARK 6X9 LF (GAUZE/BANDAGES/DRESSINGS)
CANISTER SUCT 3000ML PPV (MISCELLANEOUS) ×2 IMPLANT
CHLORAPREP W/TINT 26 (MISCELLANEOUS) ×4 IMPLANT
COVER SURGICAL LIGHT HANDLE (MISCELLANEOUS) ×2 IMPLANT
CUFF TOURN SGL QUICK 34 (TOURNIQUET CUFF) ×2
CUFF TOURN SGL QUICK 42 (TOURNIQUET CUFF) IMPLANT
CUFF TRNQT CYL 34X4.125X (TOURNIQUET CUFF) ×2 IMPLANT
DRAPE OEC MINIVIEW 54X84 (DRAPES) ×2 IMPLANT
DRAPE U-SHAPE 47X51 STRL (DRAPES) ×2 IMPLANT
DRSG MEPITEL 4X7.2 (GAUZE/BANDAGES/DRESSINGS) ×2 IMPLANT
DRSG XEROFORM 1X8 (GAUZE/BANDAGES/DRESSINGS) ×2 IMPLANT
ELECT REM PT RETURN 9FT ADLT (ELECTROSURGICAL) ×2
ELECTRODE REM PT RTRN 9FT ADLT (ELECTROSURGICAL) ×2 IMPLANT
GAUZE PAD ABD 8X10 STRL (GAUZE/BANDAGES/DRESSINGS) ×4 IMPLANT
GAUZE SPONGE 4X4 12PLY STRL (GAUZE/BANDAGES/DRESSINGS) IMPLANT
GAUZE SPONGE 4X4 12PLY STRL LF (GAUZE/BANDAGES/DRESSINGS) ×2 IMPLANT
GAUZE XEROFORM 1X8 LF (GAUZE/BANDAGES/DRESSINGS) IMPLANT
GLOVE BIOGEL M STRL SZ7.5 (GLOVE) ×2 IMPLANT
GLOVE BIOGEL PI IND STRL 8 (GLOVE) ×2 IMPLANT
GLOVE SRG 8 PF TXTR STRL LF DI (GLOVE) ×2 IMPLANT
GLOVE SURG ENC TEXT LTX SZ7.5 (GLOVE) ×2 IMPLANT
GLOVE SURG UNDER POLY LF SZ8 (GLOVE) ×2
GOWN STRL REUS W/ TWL LRG LVL3 (GOWN DISPOSABLE) ×2 IMPLANT
GOWN STRL REUS W/ TWL XL LVL3 (GOWN DISPOSABLE) ×4 IMPLANT
GOWN STRL REUS W/TWL LRG LVL3 (GOWN DISPOSABLE) ×2
GOWN STRL REUS W/TWL XL LVL3 (GOWN DISPOSABLE) ×4
GUIDEWIRE 1.35MM (WIRE) IMPLANT
KIT BASIN OR (CUSTOM PROCEDURE TRAY) ×2 IMPLANT
KIT TURNOVER KIT B (KITS) ×2 IMPLANT
NS IRRIG 1000ML POUR BTL (IV SOLUTION) ×2 IMPLANT
PACK ORTHO EXTREMITY (CUSTOM PROCEDURE TRAY) ×2 IMPLANT
PAD ARMBOARD 7.5X6 YLW CONV (MISCELLANEOUS) ×4 IMPLANT
PAD CAST 4YDX4 CTTN HI CHSV (CAST SUPPLIES) ×2 IMPLANT
PADDING CAST ABS COTTON 4X4 ST (CAST SUPPLIES) IMPLANT
PADDING CAST COTTON 4X4 STRL (CAST SUPPLIES) ×4
PLATE LOCK DIST FIB RT 5H (Plate) IMPLANT
SCREW CORT FT ANKLE 3.5X48 (Screw) IMPLANT
SCREW CORTICAL 3MMX16MM (Screw) IMPLANT
SCREW LOCK COMP 3X16 (Screw) IMPLANT
SCREW LP TI 3.5X14MM (Screw) IMPLANT
SCREW NL FT KREULOCK 3.5X50 (Screw) IMPLANT
SCREW QCKFIX CANN 4.0X40MM (Screw) IMPLANT
SCREW VAL KREULOCK 3.0X14 TI (Screw) IMPLANT
SCREW VAL KREULOCK 3.0X18 TI (Screw) IMPLANT
SPLINT PLASTER CAST FAST 5X30 (CAST SUPPLIES) IMPLANT
SPONGE T-LAP 18X18 ~~LOC~~+RFID (SPONGE) ×2 IMPLANT
STOCKINETTE TUBULAR SYNTH 4IN (CAST SUPPLIES) IMPLANT
SUCTION FRAZIER HANDLE 10FR (MISCELLANEOUS) ×2
SUCTION TUBE FRAZIER 10FR DISP (MISCELLANEOUS) ×2 IMPLANT
SUT ETHILON 3 0 PS 1 (SUTURE) ×2 IMPLANT
SUT MNCRL AB 3-0 PS2 27 (SUTURE) ×2 IMPLANT
SUT VIC AB 2-0 CT1 27 (SUTURE) ×4
SUT VIC AB 2-0 CT1 TAPERPNT 27 (SUTURE) ×4 IMPLANT
TOWEL GREEN STERILE (TOWEL DISPOSABLE) ×2 IMPLANT
TOWEL GREEN STERILE FF (TOWEL DISPOSABLE) ×2 IMPLANT
TUBE CONNECTING 12X1/4 (SUCTIONS) ×2 IMPLANT

## 2022-07-02 NOTE — H&P (Signed)
PREOPERATIVE H&P  Chief Complaint: Right ankle pain   HPI: Elaine Burke is a 72 y.o. female who presents for preoperative history and physical with a diagnosis of right trimalleolar ankle fracture with possible syndesmosis disruption.  She sustained this after a fall.  She is here today for surgical intervention.  Patient will be staying the night postoperatively for monitoring and for therapy.. Symptoms are rated as moderate to severe, and have been worsening.  This is significantly impairing activities of daily living.  She has elected for surgical management.   Past Medical History:  Diagnosis Date   Anemia    Arthritis    Arthritis    Diabetes mellitus without complication    HLD (hyperlipidemia)    Hypertension    Pituitary macroadenoma    Past Surgical History:  Procedure Laterality Date   ABDOMINAL HYSTERECTOMY     CESAREAN SECTION     COLONOSCOPY     EYE SURGERY     bilateral cataracts   Social History   Socioeconomic History   Marital status: Married    Spouse name: Not on file   Number of children: Not on file   Years of education: Not on file   Highest education level: Not on file  Occupational History   Not on file  Tobacco Use   Smoking status: Never   Smokeless tobacco: Never  Substance and Sexual Activity   Alcohol use: No   Drug use: No   Sexual activity: Not on file  Other Topics Concern   Not on file  Social History Narrative   Not on file   Social Determinants of Health   Financial Resource Strain: Not on file  Food Insecurity: Not on file  Transportation Needs: Not on file  Physical Activity: Not on file  Stress: Not on file  Social Connections: Not on file   Family History  Problem Relation Age of Onset   Breast cancer Paternal Aunt    Other Neg Hx        pituitary abnormality   Allergies  Allergen Reactions   Shellfish Allergy Swelling   Lisinopril Other (See Comments)    MD said affects kidneys. No take    Penicillins  Hives, Itching and Swelling    Has patient had a PCN reaction causing immediate rash, facial/tongue/throat swelling, SOB or lightheadedness with hypotension: No Has patient had a PCN reaction causing severe rash involving mucus membranes or skin necrosis: No Has patient had a PCN reaction that required hospitalization: No Has patient had a PCN reaction occurring within the last 10 years: No If all of the above answers are "NO", then may proceed with Cephalosporin use.    Prior to Admission medications   Medication Sig Start Date End Date Taking? Authorizing Provider  acetaminophen (TYLENOL) 500 MG tablet Take 1,000 mg by mouth every 12 (twelve) hours as needed for moderate pain or mild pain.   Yes [provider]  carvedilol (COREG) 25 MG tablet Take 25 mg by mouth 2 (two) times daily. 08/14/21  Yes [provider]  Cholecalciferol (VITAMIN D-3) 125 MCG (5000 UT) TABS Take 5,000 Units by mouth daily.   Yes [provider]  ibuprofen (ADVIL) 200 MG tablet Take 400 mg by mouth every 4 (four) hours as needed for mild pain or moderate pain.   Yes [provider]  isosorbide-hydrALAZINE (BIDIL) 20-37.5 MG tablet Take 0.5 tablets by mouth 3 (three) times daily. 04/23/21  Yes Romero Belling, MD  potassium chloride (  KLOR-CON) 10 MEQ tablet Take 10 mEq by mouth daily. 06/14/19  Yes [provider]  rosuvastatin (CRESTOR) 5 MG tablet Take 5 mg by mouth daily. 06/14/19  Yes [provider]  valsartan (DIOVAN) 320 MG tablet Take 1 tablet (320 mg total) by mouth daily. 07/14/21  Yes Romero Belling, MD  oxyCODONE (ROXICODONE) 5 MG immediate release tablet Take 1 tablet (5 mg total) by mouth every 4 (four) hours as needed for severe pain. Patient not taking: Reported on 06/29/2022 06/22/22   Alvira Monday, MD     Positive ROS: All other systems have been reviewed and were otherwise negative with the exception of those mentioned in the HPI and as above.  Physical  Exam:  Vitals:   07/02/22 1152  BP: (!) 164/94  Pulse: 72  Resp: 20  Temp: 98.6 F (37 C)  SpO2: 96%   General: Alert, no acute distress Cardiovascular: No pedal edema Respiratory: No cyanosis, no use of accessory musculature GI: No organomegaly, abdomen is soft and non-tender Skin: No lesions in the area of chief complaint Neurologic: Sensation intact distally Psychiatric: Patient is competent for consent with normal mood and affect Lymphatic: No axillary or cervical lymphadenopathy  MUSCULOSKELETAL: Right ankle in a short leg splint.  Exposed toes are warm and well-perfused.  No tenderness about the forefoot or proximal to the splint.  Foot without evidence of injury.  Sensation grossly intact.  Assessment: Right trimalleolar ankle fracture with possible syndesmosis disruption   Plan: Plan for open treatment of trimalleolar ankle fracture.  Possible syndesmosis fixation.  She will be admitted for extended recovery.  She may require SNF placement based on physical therapy but we will see after they evaluate her postoperatively..  We discussed the risks, benefits and alternatives of surgery which include but are not limited to wound healing complications, infection, nonunion, malunion, need for further surgery, damage to surrounding structures and continued pain.  They understand there is no guarantees to an acceptable outcome.  After weighing these risks they opted to proceed with surgery.     Terance Hart, MD    07/02/2022 1:28 PM

## 2022-07-02 NOTE — Plan of Care (Signed)

## 2022-07-02 NOTE — Anesthesia Procedure Notes (Signed)
Anesthesia Regional Block: Popliteal block   Pre-Anesthetic Checklist: , timeout performed,  Correct Patient, Correct Site, Correct Laterality,  Correct Procedure, Correct Position, site marked,  Risks and benefits discussed,  Surgical consent,  Pre-op evaluation,  At surgeon's request and post-op pain management  Laterality: Lower and Right  Prep: chloraprep       Needles:  Injection technique: Single-shot  Needle Type: Stimiplex     Needle Length: 10cm  Needle Gauge: 21     Additional Needles:   Procedures:,,,, ultrasound used (permanent image in chart),,   Motor weakness within 5 minutes.  Narrative:  Start time: 07/02/2022 3:02 PM End time: 07/02/2022 3:09 PM Injection made incrementally with aspirations every 5 mL.  Performed by: Personally  Anesthesiologist: Lewie Loron, MD  Additional Notes: Nerve located and needle positioned with direct ultrasound guidance. Good perineural spread. Patient tolerated well.

## 2022-07-02 NOTE — Anesthesia Preprocedure Evaluation (Signed)
Anesthesia Evaluation    Reviewed: Allergy & Precautions, Patient's Chart, lab work & pertinent test results, Unable to perform ROS - Chart review only  Airway Mallampati: III  TM Distance: >3 FB Neck ROM: Full    Dental no notable dental hx.    Pulmonary neg pulmonary ROS   Pulmonary exam normal breath sounds clear to auscultation       Cardiovascular hypertension, Normal cardiovascular exam Rhythm:Regular Rate:Normal     Neuro/Psych negative neurological ROS     GI/Hepatic negative GI ROS, Neg liver ROS,,,  Endo/Other  diabetes    Renal/GU negative Renal ROS     Musculoskeletal  (+) Arthritis ,    Abdominal  (+) + obese  Peds  Hematology  (+) Blood dyscrasia, anemia   Anesthesia Other Findings   Reproductive/Obstetrics                              Anesthesia Physical Anesthesia Plan  ASA: 2  Anesthesia Plan: General   Post-op Pain Management: Tylenol PO (pre-op)*   Induction: Intravenous  PONV Risk Score and Plan: 3 and Ondansetron, Dexamethasone and Treatment may vary due to age or medical condition  Airway Management Planned: LMA  Additional Equipment:   Intra-op Plan:   Post-operative Plan: Extubation in OR  Informed Consent: I have reviewed the patients History and Physical, chart, labs and discussed the procedure including the risks, benefits and alternatives for the proposed anesthesia with the patient or authorized representative who has indicated his/her understanding and acceptance.     Dental advisory given  Plan Discussed with: CRNA  Anesthesia Plan Comments:          Anesthesia Quick Evaluation

## 2022-07-02 NOTE — Transfer of Care (Signed)
Immediate Anesthesia Transfer of Care Note  Patient: Elaine Burke  Procedure(s) Performed: OPEN TREATMENT OF RIGHT TRIMALLEOLAR ANKLE FRACTURE (Right: Ankle) POSSIBLE OPEN TREATMENT OF SYNDESMOSIS (Right)  Patient Location: PACU  Anesthesia Type:General  Level of Consciousness: awake, alert , and oriented  Airway & Oxygen Therapy: Patient Spontanous Breathing  Post-op Assessment: Report given to RN and Post -op Vital signs reviewed and stable  Post vital signs: Reviewed and stable  Last Vitals:  Vitals Value Taken Time  BP 130/69 07/02/22 1701  Temp    Pulse 71 07/02/22 1704  Resp 14 07/02/22 1704  SpO2 93 % 07/02/22 1704  Vitals shown include unvalidated device data.  Last Pain:  Vitals:   07/02/22 1229  TempSrc:   PainSc: 0-No pain         Complications: No notable events documented.

## 2022-07-02 NOTE — Anesthesia Procedure Notes (Signed)
Procedure Name: LMA Insertion Date/Time: 07/02/2022 3:42 PM  Performed by: Ayesha Rumpf, CRNAPre-anesthesia Checklist: Patient identified, Emergency Drugs available, Suction available and Patient being monitored Patient Re-evaluated:Patient Re-evaluated prior to induction Oxygen Delivery Method: Circle System Utilized Preoxygenation: Pre-oxygenation with 100% oxygen Induction Type: IV induction Ventilation: Mask ventilation without difficulty LMA: LMA inserted LMA Size: 4.0 Number of attempts: 1 Airway Equipment and Method: Bite block Placement Confirmation: positive ETCO2 Tube secured with: Tape Dental Injury: Teeth and Oropharynx as per pre-operative assessment

## 2022-07-02 NOTE — Anesthesia Procedure Notes (Signed)
Anesthesia Regional Block: Adductor canal block   Pre-Anesthetic Checklist: , timeout performed,  Correct Patient, Correct Site, Correct Laterality,  Correct Procedure, Correct Position, site marked,  Risks and benefits discussed,  Surgical consent,  Pre-op evaluation,  At surgeon's request and post-op pain management  Laterality: Lower and Right  Prep: chloraprep       Needles:  Injection technique: Single-shot  Needle Type: Stimiplex     Needle Length: 9cm  Needle Gauge: 21     Additional Needles:   Procedures:,,,, ultrasound used (permanent image in chart),,    Narrative:  Start time: 07/02/2022 2:45 PM End time: 07/02/2022 3:02 PM Injection made incrementally with aspirations every 5 mL.  Performed by: Personally  Anesthesiologist: Lewie Loron, MD  Additional Notes: BP cuff, EKG monitors applied. Sedation begun. Artery and nerve location verified with ultrasound. Anesthetic injected incrementally (5ml), slowly, and after negative aspirations under direct u/s guidance. Good fascial/perineural spread. Tolerated well.

## 2022-07-03 ENCOUNTER — Encounter (HOSPITAL_COMMUNITY): Payer: Self-pay | Admitting: Orthopaedic Surgery

## 2022-07-03 DIAGNOSIS — S82851A Displaced trimalleolar fracture of right lower leg, initial encounter for closed fracture: Secondary | ICD-10-CM | POA: Diagnosis not present

## 2022-07-03 NOTE — Evaluation (Addendum)
Occupational Therapy Evaluation Patient Details Name: Elaine Burke MRN: [id number] DOB: Nov 30, 1950 Today's Date: 07/03/2022   History of Present Illness 72 y.o. female who presents for preoperative history and physical with a diagnosis of right trimalleolar ankle fracture, due to mechanical fall, with possible syndesmosis disruption. Status post open treatment of right trimalleolar ankle fracture with syndesmosis fixation 07/02/2022.   Clinical Impression   Pt admitted with the above diagnosis. Pt currently with functional limitations due to the deficits listed below (see OT Problem List). Prior to ankle fracture, pt was Mod I with all BADL tasks using a cane for functional mobility. Husband assisted with IADL such as housekeeping and meal prep. Pt is able to provided pt assistance when discharged. Pt will benefit from acute skilled OT to increase their safety and independence with ADL and functional mobility for ADL to facilitate discharge. Patient will benefit from intensive inpatient follow up therapy, >3 hours/day. If insurance does not approved intensive inpatient follow up therapy, Patient will benefit from continued inpatient follow up therapy, <3 hours/day prior to returning home with husband's assist. OT will continue to follow patient acutely.         Recommendations for follow up therapy are one component of a multi-disciplinary discharge planning process, led by the attending physician.  Recommendations may be updated based on patient status, additional functional criteria and insurance authorization.   Assistance Recommended at Discharge Frequent or constant Supervision/Assistance  Patient can return home with the following A lot of help with walking and/or transfers;A lot of help with bathing/dressing/bathroom;Help with stairs or ramp for entrance;Assist for transportation    Functional Status Assessment  Patient has had a recent decline in their functional status and  demonstrates the ability to make significant improvements in function in a reasonable and predictable amount of time.  Equipment Recommendations  Other (comment) (TBD based on patient's performance.)    Recommendations for Other Services Rehab consult     Precautions / Restrictions Precautions Precautions: Fall Restrictions Weight Bearing Restrictions: Yes RLE Weight Bearing: Non weight bearing      Mobility Bed Mobility Overal bed mobility:  (Defer to PT evaluation.)     Patient Response: Cooperative  Transfers Overall transfer level: Needs assistance Equipment used: Rolling walker (2 wheels) Transfers: Sit to/from Stand, Bed to chair/wheelchair/BSC Sit to Stand: Mod assist, From elevated surface Stand pivot transfers: Mod assist, +2 safety/equipment, +2 physical assistance         General transfer comment: Mod A x1 with 2nd person providing min guard assist for safety and IV pole management.  VC provided for hand placement and technique while using RW. Pt unable to control stand to sit and sat quickly although did attempt.      Balance Overall balance assessment: History of Falls, Needs assistance Sitting-balance support: No upper extremity supported (single foot supported) Sitting balance-Leahy Scale: Fair Sitting balance - Comments: sitting on BSC   Standing balance support: Reliant on assistive device for balance, During functional activity, Bilateral upper extremity supported Standing balance-Leahy Scale: Zero Standing balance comment: due to WB restrictions. heavy reliant on RW.        ADL either performed or assessed with clinical judgement   ADL Overall ADL's : Needs assistance/impaired Eating/Feeding: Independent;Bed level   Grooming: Wash/dry face;Wash/dry hands;Brushing hair;Oral care;Set up;Sitting   Upper Body Bathing: Set up;Sitting   Lower Body Bathing: Maximal assistance;Bed level   Upper Body Dressing : Set up;Sitting   Lower Body  Dressing: Maximal assistance;Bed level  Toilet Transfer: Moderate assistance;BSC/3in1;Stand-pivot;Rolling walker (2 wheels) Toilet Transfer Details (indicate cue type and reason): heel toe pvt from bed to [facility] Toileting- Clothing Manipulation and Hygiene: Set up;Sitting/lateral lean               Vision Baseline Vision/History: 1 Wears glasses Ability to See in Adequate Light: 0 Adequate Patient Visual Report: No change from baseline Vision Assessment?: No apparent visual deficits            Pertinent Vitals/Pain Pain Assessment Pain Assessment: No/denies pain (reports that nerve block has not worn off yet)     Hand Dominance Right   Extremity/Trunk Assessment Upper Extremity Assessment Upper Extremity Assessment: Generalized weakness   Lower Extremity Assessment Lower Extremity Assessment: Generalized weakness;RLE deficits/detail RLE Deficits / Details: Splint RLE due to recent ankle surgery   Cervical / Trunk Assessment Cervical / Trunk Assessment: Normal   Communication Communication Communication: No difficulties   Cognition Arousal/Alertness: Awake/alert Behavior During Therapy: WFL for tasks assessed/performed Overall Cognitive Status: Within Functional Limits for tasks assessed                                 Home Living Family/patient expects to be discharged to:: Unsure Living Arrangements: Spouse/significant other Available Help at Discharge: Family;Available 24 hours/day Type of Home: House Home Access: Stairs to enter [employer] of Steps: 3 - Husband reports that he has a mobile ramp for his tractor that he has been using since pt broke her ankle although he has max difficulty trying to pull/push her up the ramp. Entrance Stairs-Rails: None Home Layout: Two level;Able to live on main level with bedroom/bathroom     Bathroom Shower/Tub: Producer, television/film/video: Handicapped height Bathroom Accessibility: No    Home Equipment: Scientist, research (medical) (4 wheels);Cane - single point;BSC/3in1;Adaptive equipment Adaptive Equipment: Reacher (did not use reacher although has one)        Prior Functioning/Environment Prior Level of Function : History of Falls (last six months);Needs assist;Driving             Mobility Comments: Using SPC prior to injury. Using w/c for mobility since ankle fracture - husband helps her up the steps with w/c ADLs Comments: Ind prior to injury, could not stand for long periods of time to cook. Bird bath by herself since injury.        OT Problem List: Decreased strength;Decreased activity tolerance;Impaired balance (sitting and/or standing);Decreased knowledge of use of DME or AE;Impaired UE functional use      OT Treatment/Interventions: Self-care/ADL training;Therapeutic exercise;Therapeutic activities;Energy conservation;DME and/or AE instruction;Patient/family education;Balance training;Manual therapy;Modalities    OT Goals(Current goals can be found in the care plan section) Acute Rehab OT Goals Patient Stated Goal: To get rehab OT Goal Formulation: Patient unable to participate in goal setting Time For Goal Achievement: 07/17/22 Potential to Achieve Goals: Good ADL Goals Pt Will Perform Lower Body Bathing: sitting/lateral leans;with adaptive equipment;sit to/from stand;with supervision Pt Will Perform Lower Body Dressing: with set-up;sitting/lateral leans;sit to/from stand;with adaptive equipment;with caregiver independent in assisting Pt Will Transfer to Toilet: with supervision;stand pivot transfer;bedside commode Pt Will Perform Toileting - Clothing Manipulation and hygiene: with modified independence;sitting/lateral leans Pt/caregiver will Perform Home Exercise Program: Increased strength;Both right and left upper extremity;With theraband;Independently;With written HEP provided Additional ADL Goal #1: Patient will demonstrate improved sit to stand  transition using RW with no VC for hand placement/safety awareness requiring only standy by  assist and set-up of environment if needed.  OT Frequency: Min 2X/week    Co-evaluation PT/OT/SLP Co-Evaluation/Treatment: Yes Reason for Co-Treatment: To address functional/ADL transfers   OT goals addressed during session: ADL's and self-care;Strengthening/ROM;Proper use of Adaptive equipment and DME      AM-PAC OT "6 Clicks" Daily Activity     Outcome Measure Help from another person eating meals?: None Help from another person taking care of personal grooming?: A Little Help from another person toileting, which includes using toliet, bedpan, or urinal?: A Little Help from another person bathing (including washing, rinsing, drying)?: A Lot Help from another person to put on and taking off regular upper body clothing?: A Little Help from another person to put on and taking off regular lower body clothing?: A Lot 6 Click Score: 17   End of Session Equipment Utilized During Treatment: Gait belt;Rolling walker (2 wheels) Nurse Communication: Mobility status  Activity Tolerance: Patient tolerated treatment well Patient left: in chair;with call bell/phone within reach;with chair alarm set;with nursing/sitter in room;with family/visitor present  OT Visit Diagnosis: Unsteadiness on feet (R26.81);Repeated falls (R29.6);Muscle weakness (generalized) (M62.81)                Time: 1610-9604 OT Time Calculation (min): 19 min Charges:  OT General Charges $OT Visit: 1 Visit OT Evaluation $OT Eval Moderate Complexity: 1 Mod OT Treatments $Self Care/Home Management : 8-22 mins  Limmie Patricia, OTR/L,CBIS  Supplemental OT - [facility] and [facility] Secure Chat Preferred    Kenlee Maler, Charisse March 07/03/2022, 11:53 AM

## 2022-07-03 NOTE — Evaluation (Signed)
Physical Therapy Evaluation Patient Details Name: Elaine Burke MRN: [id number] DOB: Oct 06, 1950 Today's Date: 07/03/2022  History of Present Illness  72 y.o. female who presents 4/25 for open treatment of right trimalleolar ankle fracture with syndesmosis fixation, completed same day. PMH: HTN, HLD, DM.  Clinical Impression  Patient is s/p above surgery resulting in functional limitations due to the deficits listed below (see PT Problem List). Prior to injury, pt independent and driving however used a rollator due to impaired balance for which she has sought treatment from multiple providers. Currently up to Mod assist +2 for safe transfers in order to maintain NWB on RLE. Pt quite weak, unable to ambulate since injury, mostly pivoting out of bed to [facility] and has husband transport her in w/c. Would benefit from post-acute rehab to improve strength, safety, and independence prior to returning home. Patient will benefit from acute skilled PT to increase their independence and safety with mobility to facilitate discharge.   Patient will benefit from continued inpatient follow up therapy, <3 hours/day      Recommendations for follow up therapy are one component of a multi-disciplinary discharge planning process, led by the attending physician.  Recommendations may be updated based on patient status, additional functional criteria and insurance authorization.  Follow Up Recommendations Can patient physically be transported by private vehicle: Yes     Assistance Recommended at Discharge Intermittent Supervision/Assistance  Patient can return home with the following  A lot of help with walking and/or transfers;A little help with bathing/dressing/bathroom;Assistance with cooking/housework;Assist for transportation;Help with stairs or ramp for entrance    Equipment Recommendations Rolling walker (2 wheels)  Recommendations for Other Services       Functional Status Assessment Patient has had a  recent decline in their functional status and demonstrates the ability to make significant improvements in function in a reasonable and predictable amount of time.     Precautions / Restrictions Precautions Precautions: Fall Restrictions Weight Bearing Restrictions: Yes RLE Weight Bearing: Non weight bearing      Mobility  Bed Mobility Overal bed mobility: Needs Assistance Bed Mobility: Supine to Sit     Supine to sit: Supervision     General bed mobility comments: Supervision for safety, extra time. able to maneuver LE out of bed.    Transfers Overall transfer level: Needs assistance Equipment used: Rolling walker (2 wheels) Transfers: Sit to/from Stand, Bed to chair/wheelchair/BSC Sit to Stand: Mod assist, From elevated surface Stand pivot transfers: Mod assist, +2 safety/equipment, +2 physical assistance         General transfer comment: Mod A x1 with 2nd person providing min guard assist for safety and IV pole management.  VC provided for hand placement and technique while using RW. Pt unable to control stand to sit and sat quickly although did attempt. Required Assist under RLE to support and maintain NWB on RT when rising from bed. Cues for safety. Tood one small hop backwards with max cues for technique. Frequently releasing RW and reaching for furniture, lifting RW off of ground - max cues for safety.    Ambulation/Gait                  Stairs            Wheelchair Mobility    Modified Rankin (Stroke Patients Only)       Balance Overall balance assessment: History of Falls, Needs assistance Sitting-balance support: No upper extremity supported (single foot supported) Sitting balance-Leahy Scale: Fair Sitting balance -  Comments: sitting on BSC   Standing balance support: Reliant on assistive device for balance, During functional activity, Bilateral upper extremity supported Standing balance-Leahy Scale: Poor Standing balance comment: due to  WB restrictions. heavy reliant on RW.                             Pertinent Vitals/Pain Pain Assessment Pain Assessment: No/denies pain    Home Living Family/patient expects to be discharged to:: Unsure Living Arrangements: Spouse/significant other Available Help at Discharge: Family;Available 24 hours/day Type of Home: House Home Access: Stairs to enter Entrance Stairs-Rails: None Entrance Stairs-Number of Steps: 3 - Husband reports that he has a mobile ramp for his tractor that he has been using since pt broke her ankle although he has max difficulty trying to pull/push her up the ramp.   Home Layout: Two level;Able to live on main level with bedroom/bathroom Home Equipment: Wheelchair - manual;Rollator (4 wheels);Cane - single point;BSC/3in1;Adaptive equipment      Prior Function Prior Level of Function : History of Falls (last six months);Needs assist;Driving             Mobility Comments: Using SPC prior to injury. Using w/c for mobility since ankle fracture - husband helps her up the steps with w/c ADLs Comments: Ind prior to injury, could not stand for long periods of time to cook. Bird bath by herself since injury.     Hand Dominance   Dominant Hand: Right    Extremity/Trunk Assessment   Upper Extremity Assessment Upper Extremity Assessment: Defer to OT evaluation    Lower Extremity Assessment Lower Extremity Assessment: Generalized weakness RLE Deficits / Details: Splint RLE due to recent ankle surgery    Cervical / Trunk Assessment Cervical / Trunk Assessment: Normal  Communication   Communication: No difficulties  Cognition Arousal/Alertness: Awake/alert Behavior During Therapy: WFL for tasks assessed/performed Overall Cognitive Status: Within Functional Limits for tasks assessed                                          General Comments General comments (skin integrity, edema, etc.): Reviewed precautions    Exercises      Assessment/Plan    PT Assessment Patient needs continued PT services  PT Problem List Decreased strength;Decreased range of motion;Decreased balance;Decreased activity tolerance;Decreased mobility;Decreased knowledge of use of DME;Pain;Decreased knowledge of precautions       PT Treatment Interventions DME instruction;Gait training;Functional mobility training;Therapeutic activities;Therapeutic exercise;Balance training;Neuromuscular re-education;Patient/family education;Modalities;Wheelchair mobility training    PT Goals (Current goals can be found in the Care Plan section)  Acute Rehab PT Goals Patient Stated Goal: get well PT Goal Formulation: With patient Time For Goal Achievement: 07/17/22 Potential to Achieve Goals: Good    Frequency Min 5X/week     Co-evaluation PT/OT/SLP Co-Evaluation/Treatment: Yes Reason for Co-Treatment: To address functional/ADL transfers;For patient/therapist safety PT goals addressed during session: Mobility/safety with mobility;Balance;Proper use of DME OT goals addressed during session: ADL's and self-care;Strengthening/ROM;Proper use of Adaptive equipment and DME       AM-PAC PT "6 Clicks" Mobility  Outcome Measure Help needed turning from your back to your side while in a flat bed without using bedrails?: A Little Help needed moving from lying on your back to sitting on the side of a flat bed without using bedrails?: A Little Help needed moving to and from a  bed to a chair (including a wheelchair)?: A Lot Help needed standing up from a chair using your arms (e.g., wheelchair or bedside chair)?: A Lot Help needed to walk in hospital room?: Total Help needed climbing 3-5 steps with a railing? : Total 6 Click Score: 12    End of Session Equipment Utilized During Treatment: Gait belt Activity Tolerance: Patient tolerated treatment well Patient left: in chair;with call bell/phone within reach;with chair alarm set;with family/visitor  present;with nursing/sitter in room Nurse Communication: Mobility status PT Visit Diagnosis: Unsteadiness on feet (R26.81);Muscle weakness (generalized) (M62.81);History of falling (Z91.81);Difficulty in walking, not elsewhere classified (R26.2)    Time: 8295-6213 PT Time Calculation (min) (ACUTE ONLY): 28 min   Charges:   PT Evaluation $PT Eval Low Complexity: 1 Low          Kathlyn Sacramento, PT, DPT Physical Therapist Acute Rehabilitation Services [facility] [phone]   Berton Mount 07/03/2022, 12:28 PM

## 2022-07-03 NOTE — Anesthesia Postprocedure Evaluation (Signed)
Anesthesia Post Note  Patient: Elaine Burke  Procedure(s) Performed: OPEN TREATMENT OF RIGHT TRIMALLEOLAR ANKLE FRACTURE (Right: Ankle) POSSIBLE OPEN TREATMENT OF SYNDESMOSIS (Right)     Patient location during evaluation: PACU Anesthesia Type: General Level of consciousness: sedated and patient cooperative Pain management: pain level controlled Vital Signs Assessment: post-procedure vital signs reviewed and stable Respiratory status: spontaneous breathing Cardiovascular status: stable Anesthetic complications: no   No notable events documented.  Last Vitals:  Vitals:   07/02/22 2343 07/03/22 0817  BP: 125/80 129/65  Pulse: 66 (!) 58  Resp: 18 18  Temp:  (!) 36.4 C  SpO2: 97% 97%    Last Pain:  Vitals:   07/03/22 0817  TempSrc: Oral  PainSc:                  Lewie Loron

## 2022-07-03 NOTE — Progress Notes (Signed)
     Elaine Burke is a 72 y.o. female   Orthopaedic diagnosis: Status post open treatment of right trimalleolar ankle fracture with syndesmosis fixation 07/02/2022  Subjective: Patient doing well resting in bed during rounds.  No significant pain in the operative lower extremity due to intact nerve block.  Looking forward to working with physical therapy today.  She did have trouble maintaining nonweightbearing due to balance issues preoperatively.  Passing flatus, urinating fine.  Tolerating p.o. diet well.  Denies any new concerns today.  Objectyive: Vitals:   07/02/22 1925 07/02/22 2343  BP: (!) 140/71 125/80  Pulse: 62 66  Resp: 18 18  Temp: 97.9 F (36.6 C)   SpO2: 95% 97%     Exam: Awake and alert Respirations even and unlabored No acute distress   Assessment: Right lower extremity demonstrates well-fitted, clean, and dry right lower extremity splint.  The splint was not removed.  Exposed skin is benign appearing.  Numbness to light touch about the toes due to intact nerve block.  Unable to wiggle the toes due to intact nerve block.  Maintains range of motion at the knee gently.  Proximal calf soft and nontender.  Toes are warm and well-perfused distally.   Plan: -Nonweightbearing right lower extremity -Keep splint clean, dry, and intact -Encourage elevation of the operative extremity -Up with PT/OT today -Pain control as needed.  Plan for 5 mg oxycodone for pain control outpatient -Aspirin for DVT prophylaxis.  Plan for full-strength aspirin daily x 1 month  Will await assessment by PT/OT today regarding disposition.  She may be interested in SNF placement versus home health.  Plan for outpatient follow-up with Dr. Susa Simmonds 2 weeks from surgery for splint removal, suture removal if appropriate, radiographs of the operative ankle, and likely placement of a nonweightbearing short leg cast.    Darian Ace J. [place], PA-C

## 2022-07-03 NOTE — Discharge Instructions (Signed)

## 2022-07-03 NOTE — Progress Notes (Signed)
Pt had assisted fall to ground with Elaine Burke in room.  Pt landed on her buttocks.  No injuries sustained, Dr. Susa Simmonds and Charge nurse Ginger notified. PT vital signs remained stable.  Will continue plan of care.  Erick Blinks, RN

## 2022-07-03 NOTE — Progress Notes (Signed)
? ?  Inpatient Rehab Admissions Coordinator : ? ?Per therapy recommendations patient was screened for CIR candidacy by Xiomar Crompton RN MSN. Patient does not appear to demonstrate the medical neccesity for a Hospital Rehabilitation /CIR admit. I will not place a Rehab Consult. Recommend other Rehab Venues to be pursued. Please contact me with any questions. ? ?Ally Knodel RN MSN ?Admissions Coordinator ?[phone]  ?

## 2022-07-03 NOTE — Progress Notes (Signed)
PT Note  Patient Details Name: HAYDYN LIDDELL MRN: [id number] DOB: 07-23-1950   Waiting for arrival of her husband per request. Eager to work with therapies. Patient will have RN notify PT when he arrives this morning for evaluation.   Kathlyn Sacramento, PT, DPT Physical Therapist Acute Rehabilitation Services [facility] [phone]  07/03/2022, 9:29 AM

## 2022-07-03 NOTE — Plan of Care (Signed)

## 2022-07-04 DIAGNOSIS — S82851A Displaced trimalleolar fracture of right lower leg, initial encounter for closed fracture: Secondary | ICD-10-CM | POA: Diagnosis not present

## 2022-07-04 MED ORDER — OXYCODONE HCL 5 MG PO TABS: 5.0000 mg | ORAL_TABLET | ORAL | 0 refills | Status: DC | PRN

## 2022-07-04 MED ORDER — ASPIRIN 325 MG PO TABS: ORAL_TABLET | ORAL | 0 refills | Status: DC

## 2022-07-04 NOTE — Progress Notes (Signed)
PATIENT ID: Elaine Burke  MRN: [id number]  DOB/AGE:  [birth year] / 72 y.o.  2 Days Post-Op Procedure(s) (LRB): OPEN TREATMENT OF RIGHT TRIMALLEOLAR ANKLE FRACTURE (Right) POSSIBLE OPEN TREATMENT OF SYNDESMOSIS (Right)    PROGRESS NOTE Subjective:   Patient is alert, oriented, no Nausea, no Vomiting, yes passing gas, no Bowel Movement. Taking PO well. Denies SOB, Chest or Calf Pain. Using Incentive Spirometer, PAS in place. Ambulate non-weight bearing to right lower with pt working with therapy, Patient reports pain as 1 on 0-10 scale.    Objective: Vital signs in last 24 hours: Temp:  [97.6 F (36.4 C)-98.3 F (36.8 C)] 97.6 F (36.4 C) (04/27 0745) Pulse Rate:  [51-70] 51 (04/27 0745) Resp:  [16-18] 16 (04/27 0745) BP: (106-154)/(64-82) 131/82 (04/27 0745) SpO2:  [96 %-99 %] 97 % (04/27 0745)    Intake/Output from previous day: I/O last 3 completed shifts: In: 240 [P.O.:240] Out: -    Intake/Output this shift: No intake/output data recorded.   LABORATORY DATA: Recent Labs    07/02/22 1146 07/02/22 1200 07/02/22 1703  WBC 8.5  --   --   HGB 12.6  --   --   HCT 39.0  --   --   PLT 326  --   --   NA 135  --   --   K 3.6  --   --   CL 104  --   --   CO2 21*  --   --   BUN 15  --   --   CREATININE 0.89  --   --   GLUCOSE 123*  --   --   GLUCAP  --  109* 117*  CALCIUM 9.8  --   --     Examination: Neurologically intact Neurovascular intact Sensation intact distally Pt able to wiggle toes.  Splint is clean and dry }  Assessment:   2 Days Post-Op Procedure(s) (LRB): OPEN TREATMENT OF RIGHT TRIMALLEOLAR ANKLE FRACTURE (Right) POSSIBLE OPEN TREATMENT OF SYNDESMOSIS (Right) ADDITIONAL DIAGNOSIS:  Hypertension  Plan:  -Nonweightbearing right lower extremity -Keep splint clean, dry, and intact -Encourage elevation of the operative extremity -Up with PT/OT today -Pain control as needed.  Plan for 5 mg oxycodone for pain control outpatient -Aspirin for DVT  prophylaxis.  Plan for full-strength aspirin daily x 1 month   Will await assessment by PT/OT today regarding disposition.  She is planning on going home with her husband and home health.   Plan for outpatient follow-up with Dr. Susa Simmonds 2 weeks from surgery for splint removal, suture removal if appropriate, radiographs of the operative ankle, and likely placement of a nonweightbearing short leg cast.      Dannielle Burn 07/04/2022, 8:27 AM

## 2022-07-04 NOTE — TOC Transition Note (Signed)
Transition of Care [facility]) - CM/SW Discharge Note   Patient Details  Name: Elaine Burke MRN: [id number] Date of Birth: 24-Aug-1950  Transition of Care [facility]) CM/SW Contact:  Tom-Johnson, Hershal Coria, RN Phone Number: 07/04/2022, 3:10 PM   Clinical Narrative:     Patient is scheduled for discharge today.  Home health referral called in to [facility] per patient's request. Kandee Keen voiced acceptance, info on AVS. Patient declined RW, states she has a rollator and will get a RW from her aunt if needed.  Hospital f/u and discharge instructions on AVS.   Husband at bedside and will transport at discharge.  No further TOC needs noted.    Final next level of care: Home w Home Health Services Barriers to Discharge: Barriers Resolved   Patient Goals and CMS Choice CMS Medicare.gov Compare Post Acute Care list provided to:: Patient Choice offered to / list presented to : Patient  Discharge Placement                  Patient to be transferred to facility by: Husband      Discharge Plan and Services Additional resources added to the After Visit Summary for                  DME Arranged: N/A DME Agency: NA       HH Arranged: PT, OT HH Agency: [facility] Date [facility] Agency Contacted: 07/04/22 Time HH Agency Contacted: 1500 Representative spoke with at [facility] Agency: Kandee Keen  Social Determinants of Health (SDOH) Interventions SDOH Screenings   Food Insecurity: No Food Insecurity (07/02/2022)  Housing: Low Risk  (07/02/2022)  Transportation Needs: No Transportation Needs (07/02/2022)  Utilities: Not At Risk (07/02/2022)  Tobacco Use: Low Risk  (07/03/2022)     Readmission Risk Interventions     No data to display

## 2022-07-04 NOTE — Progress Notes (Signed)
Physical Therapy Treatment Patient Details Name: Elaine Burke MRN: [id number] DOB: 06-Nov-1950 Today's Date: 07/04/2022   History of Present Illness 72 y.o. female who presents 4/25 for open treatment of right trimalleolar ankle fracture with syndesmosis fixation, completed same day. PMH: HTN, HLD, DM.    PT Comments    Pt received sitting in recliner and agreeable to session focused on safe transfers. Pt able to improve power up to stand with therapist's foot under pt's RLE to maintain NWB. Pt able to briefly keep RLE elevated once standing, however required cues. Pt able to heel-toe pivot with RW support and min A for balance, however pt with one posterior LOB onto EOB requiring max A. Pt with improved pivot back to the recliner with decreased unsteadiness and no LOB. Pt able to tolerate 2 more standing trials from recliner progressing to min A. Discussed benefits of post-acute rehab before returning home with pt expressing that she is not interested in SNF and wishes to return home. Discussed home set up and safety once discharged. Pt and husband explained plans and techniques for mobility at home and reporting use of these techniques after injury prior to surgery. Pt's husband reporting that his sister is coming to assist at discharge. After discussion with supervising PT, Logan B., discharge recommendations have been updated. Pt continues to benefit from PT services to progress toward functional mobility goals.     Recommendations for follow up therapy are one component of a multi-disciplinary discharge planning process, led by the attending physician.  Recommendations may be updated based on patient status, additional functional criteria and insurance authorization.     Assistance Recommended at Discharge Intermittent Supervision/Assistance  Patient can return home with the following A lot of help with walking and/or transfers;A little help with bathing/dressing/bathroom;Assistance with  cooking/housework;Assist for transportation;Help with stairs or ramp for entrance   Equipment Recommendations  Rolling walker (2 wheels)    Recommendations for Other Services       Precautions / Restrictions Precautions Precautions: Fall Restrictions Weight Bearing Restrictions: Yes RLE Weight Bearing: Non weight bearing     Mobility  Bed Mobility Overal bed mobility: Needs Assistance             General bed mobility comments: Pt beginning and ending session in recliner    Transfers Overall transfer level: Needs assistance Equipment used: Rolling walker (2 wheels) Transfers: Sit to/from Stand, Bed to chair/wheelchair/BSC Sit to Stand: Min assist, Mod assist Stand pivot transfers: Min assist         General transfer comment: Pt able to stand from recliner x1 with mod A for power up progressing to min A from EOB x1 and recliner x2. Cues for hand placement. Pt able to heel-to pivot on LLE with therapist's foot under RLE to maintain NWB. Pt with one posterior LOB onto bed requiring max A for descent to EOB, pt reporting LOB was due to vestibular issues. Pt able to pivot back to chair without LOB and improved eccentric control with descent. Pt able to maintain NWB with cues throughout. Pt with improved management of RW with cues for proximity and sequencing.    Ambulation/Gait               General Gait Details: unable        Balance Overall balance assessment: History of Falls, Needs assistance Sitting-balance support: No upper extremity supported Sitting balance-Leahy Scale: Fair Sitting balance - Comments: sitting in recliner and on EOB   Standing balance support: Reliant  on assistive device for balance, During functional activity, Bilateral upper extremity supported Standing balance-Leahy Scale: Poor Standing balance comment: with RW support and RLE NWB                            Cognition Arousal/Alertness: Awake/alert Behavior During  Therapy: WFL for tasks assessed/performed Overall Cognitive Status: Within Functional Limits for tasks assessed                                          Exercises Other Exercises Other Exercises: Reviewed exercise HEP    General Comments        Pertinent Vitals/Pain Pain Assessment Pain Assessment: 0-10 Pain Score: 1  Pain Location: R ankle Pain Descriptors / Indicators: Aching Pain Intervention(s): Monitored during session, Repositioned     PT Goals (current goals can now be found in the care plan section) Acute Rehab PT Goals Patient Stated Goal: get well PT Goal Formulation: With patient Time For Goal Achievement: 07/17/22 Potential to Achieve Goals: Good Progress towards PT goals: Progressing toward goals    Frequency    Min 5X/week      PT Plan Discharge plan needs to be updated       AM-PAC PT "6 Clicks" Mobility   Outcome Measure  Help needed turning from your back to your side while in a flat bed without using bedrails?: A Little Help needed moving from lying on your back to sitting on the side of a flat bed without using bedrails?: A Little Help needed moving to and from a bed to a chair (including a wheelchair)?: A Lot Help needed standing up from a chair using your arms (e.g., wheelchair or bedside chair)?: A Lot Help needed to walk in hospital room?: Total Help needed climbing 3-5 steps with a railing? : Total 6 Click Score: 12    End of Session Equipment Utilized During Treatment: Gait belt Activity Tolerance: Patient tolerated treatment well Patient left: in chair;with call bell/phone within reach;with family/visitor present Nurse Communication: Mobility status PT Visit Diagnosis: Unsteadiness on feet (R26.81);Muscle weakness (generalized) (M62.81);History of falling (Z91.81);Difficulty in walking, not elsewhere classified (R26.2)     Time: 1610-9604 PT Time Calculation (min) (ACUTE ONLY): 32 min  Charges:  $Therapeutic  Activity: 23-37 mins                     Johny Shock, PTA Acute Rehabilitation Services Secure Chat Preferred  [phone]    Johny Shock 07/04/2022, 11:47 AM

## 2022-07-04 NOTE — Discharge Summary (Signed)
Patient ID: Elaine Burke MRN: [id number] DOB/AGE: Jan 11, 1951 72 y.o.  Admit date: 07/02/2022 Discharge date: 07/04/2022  Admission Diagnoses:  Principal Problem:   Closed right ankle fracture   Discharge Diagnoses:  Same  Past Medical History:  Diagnosis Date   Anemia    Arthritis    Arthritis    Diabetes mellitus without complication (HCC)    HLD (hyperlipidemia)    Hypertension    Pituitary macroadenoma (HCC)     Surgeries: Procedure(s): OPEN TREATMENT OF RIGHT TRIMALLEOLAR ANKLE FRACTURE POSSIBLE OPEN TREATMENT OF SYNDESMOSIS on 07/02/2022   Consultants:   Discharged Condition: Improved  Hospital Course: Elaine Burke is an 72 y.o. female who was admitted 07/02/2022 for operative treatment ofClosed right ankle fracture. Patient has severe unremitting pain that affects sleep, daily activities, and work/hobbies. After pre-op clearance the patient was taken to the operating room on 07/02/2022 and underwent  Procedure(s): OPEN TREATMENT OF RIGHT TRIMALLEOLAR ANKLE FRACTURE POSSIBLE OPEN TREATMENT OF SYNDESMOSIS.    Patient was given perioperative antibiotics:  Anti-infectives (From admission, onward)    Start     Dose/Rate Route Frequency Ordered Stop   07/02/22 2200  ceFAZolin (ANCEF) IVPB 2g/100 mL premix        2 g 200 mL/hr over 30 Minutes Intravenous Every 6 hours 07/02/22 1840 07/03/22 1101   07/02/22 1156  ceFAZolin (ANCEF) 2-4 GM/100ML-% IVPB       Note to Pharmacy: Elaine Burke: cabinet override      07/02/22 1156 07/02/22 1547   07/02/22 1145  ceFAZolin (ANCEF) IVPB 2g/100 mL premix        2 g 200 mL/hr over 30 Minutes Intravenous On call to O.R. 07/02/22 1144 07/02/22 1544        Patient was given sequential compression devices, early ambulation, and chemoprophylaxis to prevent DVT.  Patient benefited maximally from hospital stay and there were no complications.    Recent vital signs: Patient Vitals for the past 24 hrs:  BP Temp  Temp src Pulse Resp SpO2  07/04/22 0745 131/82 97.6 F (36.4 C) -- (!) 51 16 97 %  07/04/22 0411 106/64 98 F (36.7 C) -- (!) 58 17 99 %  07/03/22 1916 (!) 154/81 98.3 F (36.8 C) Oral 70 17 98 %  07/03/22 1541 (!) 140/75 98 F (36.7 C) Oral 70 18 99 %  07/03/22 1439 (!) 145/81 97.9 F (36.6 C) Oral 62 18 96 %     Recent laboratory studies:  Recent Labs    07/02/22 1146  WBC 8.5  HGB 12.6  HCT 39.0  PLT 326  NA 135  K 3.6  CL 104  CO2 21*  BUN 15  CREATININE 0.89  GLUCOSE 123*  CALCIUM 9.8     Discharge Medications:   Allergies as of 07/04/2022       Reactions   Shellfish Allergy Swelling   Lisinopril Other (See Comments)   MD said affects kidneys. No take    Penicillins Hives, Itching, Swelling   ** Tolerated cefazolin Has patient had a PCN reaction causing immediate rash, facial/tongue/throat swelling, SOB or lightheadedness with hypotension: No Has patient had a PCN reaction causing severe rash involving mucus membranes or skin necrosis: No Has patient had a PCN reaction that required hospitalization: No Has patient had a PCN reaction occurring within the last 10 years: No If all of the above answers are "NO", then may proceed with Cephalosporin use.        Medication List  TAKE these medications    acetaminophen 500 MG tablet Commonly known as: TYLENOL Take 1,000 mg by mouth every 12 (twelve) hours as needed for moderate pain or mild pain.   aspirin 325 MG tablet Commonly known as: Bayer Aspirin Take 1 tablet by mouth for 30 DAYS for blood clot prevention   carvedilol 25 MG tablet Commonly known as: COREG Take 25 mg by mouth 2 (two) times daily.   ibuprofen 200 MG tablet Commonly known as: ADVIL Take 400 mg by mouth every 4 (four) hours as needed for mild pain or moderate pain.   isosorbide-hydrALAZINE 20-37.5 MG tablet Commonly known as: BIDIL Take 0.5 tablets by mouth 3 (three) times daily.   oxyCODONE 5 MG immediate release  tablet Commonly known as: Roxicodone Take 1 tablet (5 mg total) by mouth every 4 (four) hours as needed for severe pain.   potassium chloride 10 MEQ tablet Commonly known as: KLOR-CON Take 10 mEq by mouth daily.   rosuvastatin 5 MG tablet Commonly known as: CRESTOR Take 5 mg by mouth daily.   valsartan 320 MG tablet Commonly known as: DIOVAN Take 1 tablet (320 mg total) by mouth daily.   Vitamin D-3 125 MCG (5000 UT) Tabs Take 5,000 Units by mouth daily.               Durable Medical Equipment  (From admission, onward)           Start     Ordered   07/02/22 1841  DME Walker rolling  Once       Question Answer Comment  Walker: With 5 Inch Wheels   Patient needs a walker to treat with the following condition Closed right ankle fracture      07/02/22 1840              Discharge Care Instructions  (From admission, onward)           Start     Ordered   07/04/22 0000  Non weight bearing        07/04/22 1610            Diagnostic Studies: DG Ankle Complete Right  Result Date: 07/02/2022 CLINICAL DATA:  ORIF EXAM: Intraoperative fluoroscopy COMPARISON:  Preop x-ray 06/22/2022 FINDINGS: Three fluoroscopic spot images submitted for review demonstrates oblique paired screws along the medial malleolus. Lateral fixation plate and screws along the distal fibula with 2 screws crossing to the tibia. Imaging was obtained to aid in treatment. Please correlate with real-time fluoroscopy of 36 seconds. Cumulative dose 0.69 mGy IMPRESSION: ORIF.  Intraoperative fluoroscopy Electronically Signed   By: Karen Kays M.D.   On: 07/02/2022 17:11   DG C-Arm 1-60 Min-No Report  Result Date: 07/02/2022 Fluoroscopy was utilized by the requesting physician.  No radiographic interpretation.   DG Ankle Complete Right  Result Date: 06/22/2022 CLINICAL DATA:  Right ankle pain after mechanical fall this morning. EXAM: RIGHT ANKLE - COMPLETE 3+ VIEW; RIGHT FOOT COMPLETE - 3+  VIEW COMPARISON:  None Available. FINDINGS: Right ankle: There is a comminuted displaced fracture of the medial malleolus. There is also spiral fracture of the distal fibula above the level of the syndesmotic ligament. There is also mildly displaced fracture of the posterior aspect of the tibia. There is mild lateral displacement of the talus relative to the tibial plafond. There is marked soft tissue swelling about the medial and lateral aspect of the ankle. Right foot: There is no evidence of fracture, dislocation, or joint effusion.  Small plantar calcaneal spurring and mild Achilles enthesopathy. Os peroneum incidentally noted. There is no evidence of arthropathy or other focal bone abnormality. Marked soft tissue swelling about the dorsum of the foot. IMPRESSION: 1. Trimalleolar fracture of the right ankle with mild lateral displacement of the talus relative to the tibial plafond. 2. Marked soft tissue swelling about the ankle and dorsum of the foot. Electronically Signed   By: Larose Hires D.O.   On: 06/22/2022 11:27   DG Foot Complete Right  Result Date: 06/22/2022 CLINICAL DATA:  Right ankle pain after mechanical fall this morning. EXAM: RIGHT ANKLE - COMPLETE 3+ VIEW; RIGHT FOOT COMPLETE - 3+ VIEW COMPARISON:  None Available. FINDINGS: Right ankle: There is a comminuted displaced fracture of the medial malleolus. There is also spiral fracture of the distal fibula above the level of the syndesmotic ligament. There is also mildly displaced fracture of the posterior aspect of the tibia. There is mild lateral displacement of the talus relative to the tibial plafond. There is marked soft tissue swelling about the medial and lateral aspect of the ankle. Right foot: There is no evidence of fracture, dislocation, or joint effusion. Small plantar calcaneal spurring and mild Achilles enthesopathy. Os peroneum incidentally noted. There is no evidence of arthropathy or other focal bone abnormality. Marked soft  tissue swelling about the dorsum of the foot. IMPRESSION: 1. Trimalleolar fracture of the right ankle with mild lateral displacement of the talus relative to the tibial plafond. 2. Marked soft tissue swelling about the ankle and dorsum of the foot. Electronically Signed   By: Larose Hires D.O.   On: 06/22/2022 11:27    Disposition: Discharge disposition: 01-Home or Self Care       Discharge Instructions     Call MD / Call 911   Complete by: As directed    If you experience chest pain or shortness of breath, CALL 911 and be transported to the hospital emergency room.  If you develope a fever above 101 F, pus (white drainage) or increased drainage or redness at the wound, or calf pain, call your surgeon's office.   Constipation Prevention   Complete by: As directed    Drink plenty of fluids.  Prune juice may be helpful.  You may use a stool softener, such as Colace (over the counter) 100 mg twice a day.  Use MiraLax (over the counter) for constipation as needed.   Diet - low sodium heart healthy   Complete by: As directed    Non weight bearing   Complete by: As directed    Post-operative opioid taper instructions:   Complete by: As directed    POST-OPERATIVE OPIOID TAPER INSTRUCTIONS: It is important to wean off of your opioid medication as soon as possible. If you do not need pain medication after your surgery it is ok to stop day one. Opioids include: Codeine, Hydrocodone(Norco, Vicodin), Oxycodone(Percocet, oxycontin) and hydromorphone amongst others.  Long term and even short term use of opiods can cause: Increased pain response Dependence Constipation Depression Respiratory depression And more.  Withdrawal symptoms can include Flu like symptoms Nausea, vomiting And more Techniques to manage these symptoms Hydrate well Eat regular healthy meals Stay active Use relaxation techniques(deep breathing, meditating, yoga) Do Not substitute Alcohol to help with tapering If you  have been on opioids for less than two weeks and do not have pain than it is ok to stop all together.  Plan to wean off of opioids This plan should start within  one week post op of your joint replacement. Maintain the same interval or time between taking each dose and first decrease the dose.  Cut the total daily intake of opioids by one tablet each day Next start to increase the time between doses. The last dose that should be eliminated is the evening dose.           Follow-up Information     Terance Hart, MD Follow up in 2 week(s).   Specialty: Orthopedic Surgery Contact information: [address] [place] [address] [phone]                  Signed: Dannielle Burn 07/04/2022, 8:33 AM

## 2022-07-04 NOTE — Plan of Care (Addendum)
Patient alert and oriented. IV access out. Discharge teaching given.  Problem: Education: Goal: Knowledge of General Education information will improve Description: Including pain rating scale, medication(s)/side effects and non-pharmacologic comfort measures Outcome: Adequate for Discharge   Problem: Health Behavior/Discharge Planning: Goal: Ability to manage health-related needs will improve Outcome: Adequate for Discharge   Problem: Clinical Measurements: Goal: Ability to maintain clinical measurements within normal limits will improve Outcome: Adequate for Discharge Goal: Will remain free from infection Outcome: Adequate for Discharge Goal: Diagnostic test results will improve Outcome: Adequate for Discharge Goal: Respiratory complications will improve Outcome: Adequate for Discharge Goal: Cardiovascular complication will be avoided Outcome: Adequate for Discharge   Problem: Activity: Goal: Risk for activity intolerance will decrease Outcome: Adequate for Discharge   Problem: Nutrition: Goal: Adequate nutrition will be maintained Outcome: Adequate for Discharge   Problem: Coping: Goal: Level of anxiety will decrease Outcome: Adequate for Discharge   Problem: Elimination: Goal: Will not experience complications related to bowel motility Outcome: Adequate for Discharge Goal: Will not experience complications related to urinary retention Outcome: Adequate for Discharge   Problem: Pain Managment: Goal: General experience of comfort will improve Outcome: Adequate for Discharge   Problem: Safety: Goal: Ability to remain free from injury will improve Outcome: Adequate for Discharge   Problem: Skin Integrity: Goal: Risk for impaired skin integrity will decrease Outcome: Adequate for Discharge

## 2022-07-20 NOTE — Op Note (Signed)
Elaine Burke female 72 y.o. 07/02/2022  PreOperative Diagnosis: Right trimalleolar ankle fracture Right syndesmosis disruption  PostOperative Diagnosis: same  PROCEDURE: Open reduction internal fixation of right trimalleolar ankle fracture without posterior fixation Open reduction internal fixation of syndesmosis Ankle stress view fluoroscopy  SURGEON: Dub Mikes, MD  ASSISTANT: Jesse [place], PA-C: His assistance was necessary for prep and drape, exposure, holding retractors, assistance with fracture reduction, placement of hardware, wound closure and splinting.   ANESTHESIA: general with peripheral nerve block  FINDINGS: Displaced trimalleolar ankle fracture and syndesmosis  IMPLANTS: Arthrex distal fibular locking plate with fully threaded screws for syndesmosis fixation  INDICATIONS:71 y.o. femalesustained the above injury after a fall.  She had displacement of her fractures and was indicated for surgery.  There is apparent disruption of the syndesmosis preoperative.   Patient understood the risks, benefits and alternatives to surgery which include but are not limited to wound healing complications, infection, nonunion, malunion, need for further surgery as well as damage to surrounding structures. They also understood the potential for continued pain in that there were no guarantees of acceptable outcome After weighing these risks the patient opted to proceed with surgery.  PROCEDURE: Patient was identified in the preoperative holding area.  The right leg was marked by myself.  Consent was signed by myself and the patient.  Block was performed by anesthesia in the preoperative holding area.  Patient was taken to the operative suite and placed supine on the operative table.  General anesthesia was induced without difficulty. Bump was placed under the operative hip and bone foam was used.  All bony prominences were well padded.  Tourniquet was placed on the operative  thigh.  Preoperative antibiotics were given. The extremity was prepped and draped in the usual sterile fashion and surgical timeout was performed.  The limb was elevated and the tourniquet was inflated to 250 mmHg.  We began by making a longitudinal incision overlying the fibula.  This was taken sharply down through skin and subcutaneous tissue.  Blunt dissection was used to identify any branch of the superficial peroneal nerve that was not identified.  The incision was then taken sharply down to bone and the fracture site was identified.  The fracture site was mobilized.   The fracture site were cleaned with a rondure and curette of any fracture hematoma and callus formation.  Then the fracture of the fibula was reduced under direct visualization and held provisionally with a lobster claw.  Then fluoroscopy confirmed adequate reduction of the ankle mortise at that time.  Then a one third tubular plate was placed across the fracture site for fixation.  This provided good stability of the distal fibula fracture.    We then turned our attention to the medial malleolus.  There was continued displacement of the medial malleolus and therefore an incision was made overlying this.  This was taken sharply down through skin and subcutaneous tissue.  Bovie cautery was used for skin bleeders.  Then sharp dissection down to the fracture and the fracture site was identified.  The soft tissue flap was carried anteriorly to identify the medial gutter of the ankle joint.  Then the fracture site was mobilized and using a curette and rondure the fracture was cleared out of hematoma and fracture callus.  Then the fracture was reduced and held provisionally with a pointed reduction forcep.  This was done under direct visualization.  Then fluoroscopy confirmed adequate reduction.  2 4.0 mm partially-threaded cannulated screws were placed across  the fracture site with good fixation.    We then proceeded with stress view fluoroscopy  of the ankle.  It was noted on stress views that there is widening of the syndesmosis and medial clear space.  We then proceeded with open treatment of the syndesmosis.  Separate deep incision was created distally along the level of the syndesmosis.  We would gain access to the syndesmosis and the ligaments had been torn.  The syndesmosis was in cleared of any interposed tissue using a rongeur and a Therapist, nutritional.  Then the syndesmosis was reduced under direct position and held provisionally with a Weber clamp.  Fluoroscopy confirmed appropriate position of the syndesmosis and closing down the mortise.  We then proceeded to place 2 fully threaded screws across the syndesmosis for stabilizing.  Screws were chosen due to the difficulty with maintaining nonweightbearing for the patient.  The ankle was stressed under direct visualization and found to be stable.  Postplacement stress views were stable.  Then lateral x-rays were obtained in the posterior malleolar fragment appeared well reduced and 2 small for internal fixation.  Decision was made to treat it without fixation.  Final films were obtained.  The wounds were irrigated and the subcuticular tissue was closed with 3-0 Monocryl and the skin with staples.  Xeroform was placed on the wounds as well as 4 x 4's and sterile sheet cotton.  The tourniquet was released.    Patient was placed in a nonweightbearing short leg splint.  Patient tolerated the procedure well.  There were no complications.  Patient was awakened from anesthesia and taken recovery in stable condition.  POST OPERATIVE INSTRUCTIONS: Nonweightbearing on operative extremity Keep splint dry and limb elevated Call the office with concerns Follow-up in 2 weeks for splint removal, x-rays of the operative ankle, nonweightbearing and suture removal if appropriate.     TOURNIQUET TIME:less than 2 hours  BLOOD LOSS:  Minimal         DRAINS: none         SPECIMEN: none        COMPLICATIONS:  * No complications entered in OR log *         Disposition: PACU - hemodynamically stable.         Condition: stable

## 2022-09-02 ENCOUNTER — Ambulatory Visit: Payer: Medicare HMO | Admitting: Internal Medicine

## 2022-11-02 ENCOUNTER — Emergency Department (HOSPITAL_BASED_OUTPATIENT_CLINIC_OR_DEPARTMENT_OTHER)
Admission: EM | Admit: 2022-11-02 | Discharge: 2022-11-02 | Disposition: A | Discharge status: Home / Self Care | Payer: Medicare HMO | Attending: Emergency Medicine | Admitting: Emergency Medicine

## 2022-11-02 ENCOUNTER — Encounter (HOSPITAL_BASED_OUTPATIENT_CLINIC_OR_DEPARTMENT_OTHER): Payer: Self-pay

## 2022-11-02 ENCOUNTER — Other Ambulatory Visit: Payer: Self-pay

## 2022-11-02 DIAGNOSIS — E119 Type 2 diabetes mellitus without complications: Secondary | ICD-10-CM | POA: Diagnosis not present

## 2022-11-02 DIAGNOSIS — I10 Essential (primary) hypertension: Secondary | ICD-10-CM | POA: Insufficient documentation

## 2022-11-02 DIAGNOSIS — Z79899 Other long term (current) drug therapy: Secondary | ICD-10-CM | POA: Insufficient documentation

## 2022-11-02 DIAGNOSIS — Z7982 Long term (current) use of aspirin: Secondary | ICD-10-CM | POA: Insufficient documentation

## 2022-11-02 LAB — CBC WITH DIFFERENTIAL/PLATELET
Abs Immature Granulocytes: 0.02 10*3/uL (ref 0.00–0.07)
Basophils Absolute: 0 10*3/uL (ref 0.0–0.1)
Basophils Relative: 1 %
Eosinophils Absolute: 0.1 10*3/uL (ref 0.0–0.5)
Eosinophils Relative: 1 %
HCT: 44 % (ref 36.0–46.0)
Hemoglobin: 14.5 g/dL (ref 12.0–15.0)
Immature Granulocytes: 0 %
Lymphocytes Relative: 28 %
Lymphs Abs: 2.4 10*3/uL (ref 0.7–4.0)
MCH: 30.1 pg (ref 26.0–34.0)
MCHC: 33 g/dL (ref 30.0–36.0)
MCV: 91.5 fL (ref 80.0–100.0)
Monocytes Absolute: 0.4 10*3/uL (ref 0.1–1.0)
Monocytes Relative: 5 %
Neutro Abs: 5.8 10*3/uL (ref 1.7–7.7)
Neutrophils Relative %: 65 %
Platelets: 300 10*3/uL (ref 150–400)
RBC: 4.81 MIL/uL (ref 3.87–5.11)
RDW: 13.1 % (ref 11.5–15.5)
WBC: 8.7 10*3/uL (ref 4.0–10.5)
nRBC: 0 % (ref 0.0–0.2)

## 2022-11-02 LAB — BASIC METABOLIC PANEL
Anion gap: 9 (ref 5–15)
BUN: 10 mg/dL (ref 8–23)
CO2: 30 mmol/L (ref 22–32)
Calcium: 10.2 mg/dL (ref 8.9–10.3)
Chloride: 101 mmol/L (ref 98–111)
Creatinine, Ser: 0.88 mg/dL (ref 0.44–1.00)
GFR, Estimated: >60 mL/min (ref >60)
Glucose, Bld: 143 mg/dL — ABNORMAL HIGH (ref 70–99)
Potassium: 3.4 mmol/L — ABNORMAL LOW (ref 3.5–5.1)
Sodium: 140 mmol/L (ref 135–145)

## 2022-11-02 MED ORDER — CARVEDILOL 12.5 MG PO TABS
25.0000 mg | ORAL_TABLET | Freq: Once | ORAL | Status: AC
  Administered 2022-11-02: 25 mg via ORAL
  Filled 2022-11-02: qty 2

## 2022-11-02 MED ORDER — AMLODIPINE BESYLATE 5 MG PO TABS: 5.0000 mg | ORAL_TABLET | Freq: Every day | ORAL | 0 refills | Status: DC

## 2022-11-02 MED ORDER — ISOSORB DINITRATE-HYDRALAZINE 20-37.5 MG PO TABS: 0.5000 | ORAL_TABLET | Freq: Three times a day (TID) | ORAL | Status: DC

## 2022-11-02 NOTE — ED Provider Notes (Signed)
[place] EMERGENCY DEPARTMENT AT [facility] Provider Note   CSN: [id number] Arrival date & time: 11/02/22  1800     History {Add pertinent medical, surgical, social history, OB history to HPI:1} Chief Complaint  Patient presents with   Hypertension    Elaine Burke is a 72 y.o. female.   Hypertension       Home Medications Prior to Admission medications   Medication Sig Start Date End Date Taking? Authorizing Provider  acetaminophen (TYLENOL) 500 MG tablet Take 1,000 mg by mouth every 12 (twelve) hours as needed for moderate pain or mild pain.   Yes [provider]  carvedilol (COREG) 25 MG tablet Take 25 mg by mouth 2 (two) times daily. 08/14/21  Yes [provider]  Cholecalciferol (VITAMIN D-3) 125 MCG (5000 UT) TABS Take 5,000 Units by mouth daily.   Yes [provider]  furosemide (LASIX) 20 MG tablet Take 20 mg by mouth daily. 10/30/22  Yes [provider]  ibuprofen (ADVIL) 200 MG tablet Take 400 mg by mouth every 4 (four) hours as needed for mild pain or moderate pain.   Yes [provider]  isosorbide-hydrALAZINE (BIDIL) 20-37.5 MG tablet Take 0.5 tablets by mouth 3 (three) times daily. 04/23/21  Yes Romero Belling, MD  potassium chloride (KLOR-CON) 10 MEQ tablet Take 10 mEq by mouth daily. 06/14/19  Yes [provider]  rosuvastatin (CRESTOR) 5 MG tablet Take 5 mg by mouth daily. 06/14/19  Yes [provider]  valsartan (DIOVAN) 320 MG tablet Take 1 tablet (320 mg total) by mouth daily. 07/14/21  Yes Romero Belling, MD  aspirin (BAYER ASPIRIN) 325 MG tablet Take 1 tablet by mouth for 30 DAYS for blood clot prevention 07/04/22   Allena Katz, PA-C  oxyCODONE (ROXICODONE) 5 MG immediate release tablet Take 1 tablet (5 mg total) by mouth every 4 (four) hours as needed for severe pain. 07/04/22   Allena Katz, PA-C      Allergies    Shellfish allergy, Lisinopril, and Penicillins    Review of  Systems   Review of Systems  Physical Exam Updated Vital Signs BP (!) (P) 200/104 (BP Location: Left Arm)   Pulse 64   Temp 98.1 F (36.7 C) (Oral)   Resp 20   Ht 5\' 3"  (1.6 m)   Wt 81.6 kg   SpO2 96%   BMI 31.89 kg/m  Physical Exam  ED Results / Procedures / Treatments   Labs (all labs ordered are listed, but only abnormal results are displayed) Labs Reviewed - No data to display  EKG None  Radiology No results found.  Procedures Procedures  {Document cardiac monitor, telemetry assessment procedure when appropriate:1}  Medications Ordered in ED Medications - No data to display  ED Course/ Medical Decision Making/ A&P   {   Click here for ABCD2, HEART and other calculatorsREFRESH Note before signing :1}                              Medical Decision Making  ***  {Document critical care time when appropriate:1} {Document review of labs and clinical decision tools ie heart score, Chads2Vasc2 etc:1}  {Document your independent review of radiology images, and any outside records:1} {Document your discussion with family members, caretakers, and with consultants:1} {Document social determinants of health affecting pt's care:1} {Document your decision making why or why not admission, treatments were needed:1} Final Clinical Impression(s) / ED  Diagnoses Final diagnoses:  None    Rx / DC Orders ED Discharge Orders     None

## 2022-11-02 NOTE — ED Notes (Signed)
ED Provider at bedside. 

## 2022-11-02 NOTE — ED Triage Notes (Signed)
Pt reports  High blood pressure Started 10 days ago Takes BP medication Hasn't missed a dose

## 2022-11-02 NOTE — Discharge Instructions (Signed)
You were seen today for high blood pressure.  Your lab work was reassuring.  I recommend you continue checking her blood pressure.  If your blood pressure remains high you can start the prescribed medication.  You should follow-up with your doctor within the next week.  If you develop chest pain, shortness of breath, neurologic changes or any other concerning symptoms you should return to the ED.

## 2022-11-02 NOTE — ED Notes (Signed)
 RN reviewed discharge instructions with pt. Pt verbalized understanding and had no further questions. VSS upon discharge.  

## 2022-11-02 NOTE — ED Provider Notes (Incomplete)
[facility] EMERGENCY DEPARTMENT AT [facility] Provider Note   CSN: [id number] Arrival date & time: 11/02/22  1800     History {Add pertinent medical, surgical, social history, OB history to HPI:1} Chief Complaint  Patient presents with  . Hypertension    Elaine Burke is a 72 y.o. female.   Hypertension  72 year old female history of hypertension, hyperlipidemia, diabetes presenting for hypertension.  Patient states she has hypertension for which she takes Bidil,, Lasix, Coreg, valsartan which she has been taking.  She states over the last 10 days she has noted her blood pressure has been high occasionally in the 200s systolic.  She talked with her doctor's office today who told her to go to the ED.  She is asymptomatic.  No headache, weakness, numbness, vision changes.  No chest pain or shortness of breath.  No fevers or chills.  She is otherwise been at her baseline health.  She is to take amlodipine but was taken off of it.  She has had some bilateral lower extremity edema for several weeks for which she is using Lasix and compression stockings from her PCP.  She feels like this is markedly improved.  No history of heart failure and no orthopnea or DOE.     Home Medications Prior to Admission medications   Medication Sig Start Date End Date Taking? Authorizing Provider  acetaminophen (TYLENOL) 500 MG tablet Take 1,000 mg by mouth every 12 (twelve) hours as needed for moderate pain or mild pain.   Yes [provider]  carvedilol (COREG) 25 MG tablet Take 25 mg by mouth 2 (two) times daily. 08/14/21  Yes [provider]  Cholecalciferol (VITAMIN D-3) 125 MCG (5000 UT) TABS Take 5,000 Units by mouth daily.   Yes [provider]  furosemide (LASIX) 20 MG tablet Take 20 mg by mouth daily. 10/30/22  Yes [provider]  ibuprofen (ADVIL) 200 MG tablet Take 400 mg by mouth every 4 (four) hours as needed for mild pain or moderate pain.   Yes  [provider]  isosorbide-hydrALAZINE (BIDIL) 20-37.5 MG tablet Take 0.5 tablets by mouth 3 (three) times daily. 04/23/21  Yes Romero Belling, MD  potassium chloride (KLOR-CON) 10 MEQ tablet Take 10 mEq by mouth daily. 06/14/19  Yes [provider]  rosuvastatin (CRESTOR) 5 MG tablet Take 5 mg by mouth daily. 06/14/19  Yes [provider]  valsartan (DIOVAN) 320 MG tablet Take 1 tablet (320 mg total) by mouth daily. 07/14/21  Yes Romero Belling, MD  aspirin (BAYER ASPIRIN) 325 MG tablet Take 1 tablet by mouth for 30 DAYS for blood clot prevention 07/04/22   Allena Katz, PA-C  oxyCODONE (ROXICODONE) 5 MG immediate release tablet Take 1 tablet (5 mg total) by mouth every 4 (four) hours as needed for severe pain. 07/04/22   Allena Katz, PA-C      Allergies    Shellfish allergy, Lisinopril, and Penicillins    Review of Systems   Review of Systems Review of systems completed and notable as per HPI.  ROS otherwise negative.   Physical Exam Updated Vital Signs BP (!) (P) 200/104 (BP Location: Left Arm)   Pulse 64   Temp 98.1 F (36.7 C) (Oral)   Resp 20   Ht 5\' 3"  (1.6 m)   Wt 81.6 kg   SpO2 96%   BMI 31.89 kg/m  Physical Exam Vitals and nursing note reviewed.  Constitutional:      General: She is not  in acute distress.    Appearance: She is well-developed.  HENT:     Head: Normocephalic and atraumatic.     Mouth/Throat:     Mouth: Mucous membranes are moist.     Pharynx: Oropharynx is clear.  Eyes:     Extraocular Movements: Extraocular movements intact.     Conjunctiva/sclera: Conjunctivae normal.     Pupils: Pupils are equal, round, and reactive to light.  Cardiovascular:     Rate and Rhythm: Normal rate and regular rhythm.     Heart sounds: No murmur heard. Pulmonary:     Effort: Pulmonary effort is normal. No respiratory distress.     Breath sounds: Normal breath sounds.  Abdominal:     Palpations: Abdomen is soft.     Tenderness: There is  no abdominal tenderness.  Musculoskeletal:        General: No swelling.     Cervical back: Neck supple.     Right lower leg: No edema.     Left lower leg: No edema.  Skin:    General: Skin is warm and dry.     Capillary Refill: Capillary refill takes less than 2 seconds.  Neurological:     General: No focal deficit present.     Mental Status: She is alert and oriented to person, place, and time. Mental status is at baseline.  Psychiatric:        Mood and Affect: Mood normal.     ED Results / Procedures / Treatments   Labs (all labs ordered are listed, but only abnormal results are displayed) Labs Reviewed - No data to display  EKG None  Radiology No results found.  Procedures Procedures  {Document cardiac monitor, telemetry assessment procedure when appropriate:1}  Medications Ordered in ED Medications - No data to display  ED Course/ Medical Decision Making/ A&P   {   Click here for ABCD2, HEART and other calculatorsREFRESH Note before signing :1}                              Medical Decision Making Amount and/or Complexity of Data Reviewed Labs: ordered.  Risk Prescription drug management.   Medical Decision Making:   Elaine Burke is a 72 y.o. female who presented to the ED today with asymptomatic hypertension.  On exam she is hypertensive otherwise stable and asymptomatic.  Normal neurologic exam.  She is no chest pain or shortness of breath or urgent signs of endorgan dysfunction.  Given duration of hypertension will obtain lab work to evaluate for renal dysfunction.  I ordered her home Coreg which she is due for.  Will trend her blood pressure here.   Patient placed on continuous vitals and telemetry monitoring while in ED which was reviewed periodically.  Reviewed and confirmed nursing documentation for past medical history, family history, social history.  Reassessment and Plan:   On reassessment blood pressure is improved with Coreg, still mildly  elevated.  She did not receive all of her home medications because we do not have them on formulary which is reassuring as well.  EKG unremarkable.  She remains asymptomatic.  I do not see any signs of hypertensive emergency or indication for admission or acute blood pressure control.  I recommend she continue taking her home medications.  I did give her a prescription for amlodipine and told her if her blood pressure remains high she can start this medication   Patient's presentation is most  consistent with {EM COPA:27473}     {Document critical care time when appropriate:1} {Document review of labs and clinical decision tools ie heart score, Chads2Vasc2 etc:1}  {Document your independent review of radiology images, and any outside records:1} {Document your discussion with family members, caretakers, and with consultants:1} {Document social determinants of health affecting pt's care:1} {Document your decision making why or why not admission, treatments were needed:1} Final Clinical Impression(s) / ED Diagnoses Final diagnoses:  None    Rx / DC Orders ED Discharge Orders     None

## 2022-11-02 NOTE — ED Notes (Signed)
Pt assisted to restroom via wheelchair by this RN.

## 2022-12-07 ENCOUNTER — Encounter: Payer: Self-pay | Admitting: Internal Medicine

## 2022-12-07 ENCOUNTER — Ambulatory Visit (INDEPENDENT_AMBULATORY_CARE_PROVIDER_SITE_OTHER): Payer: Medicare HMO | Admitting: Internal Medicine

## 2022-12-07 VITALS — BP 122/80 | HR 72 | Ht 63.0 in | Wt 192.0 lb

## 2022-12-07 DIAGNOSIS — I10 Essential (primary) hypertension: Secondary | ICD-10-CM

## 2022-12-07 DIAGNOSIS — E213 Hyperparathyroidism, unspecified: Secondary | ICD-10-CM | POA: Diagnosis not present

## 2022-12-07 DIAGNOSIS — D352 Benign neoplasm of pituitary gland: Secondary | ICD-10-CM | POA: Diagnosis not present

## 2022-12-07 LAB — POTASSIUM: Potassium: 3.7 meq/L (ref 3.5–5.1)

## 2022-12-07 LAB — TSH: TSH: 2.15 u[IU]/mL (ref 0.35–5.50)

## 2022-12-07 LAB — T4, FREE: Free T4: 0.73 ng/dL (ref 0.60–1.60)

## 2022-12-07 LAB — VITAMIN D 25 HYDROXY (VIT D DEFICIENCY, FRACTURES): VITD: 54.28 ng/mL (ref 30.00–100.00)

## 2022-12-07 LAB — CORTISOL: Cortisol, Plasma: 9.9 ug/dL

## 2022-12-07 NOTE — Progress Notes (Unsigned)
Name: Elaine Burke  MRN/ DOB: [id number], 05/08/1950    Age/ Sex: 72 y.o., female     PCP: Lewis Moccasin, MD   Reason for Endocrinology Evaluation: Pituitary adenoma /hypercalcemia      Initial Endocrinology Clinic Visit: 07/18/2019    PATIENT IDENTIFIER: Elaine Burke is a 72 y.o., female with a past medical history of pituitary macroadenoma. She has followed with [facility] since 07/18/2019 for consultative assistance with management of her pituitary adenoma .   HISTORICAL SUMMARY: The patient was first diagnosed with 1.5 cm pituitary macroadenoma in 2018 during evaluation for unsteady gait.  Historically hormonal levels and pituitary imaging has been stable  No prior exposure to XRT   HYPERPARATHYROIDISM HISTORY: Patient has been noted with hypercalcemia since 2018, with a max level of 11.5 mg/dL (uncorrected) on 93/23/5573, with inappropriately normal mall PTH with a max level of 61 PG/mL 02/19/2021  Serum calcium improved with discontinuation of HCTZ  DXA - low bone  density 08/2021 24 hr urine calcium 119 (08/2021)   SUBJECTIVE:     Today (12/07/2022):  Elaine Burke is here for a follow-up on pituitary macroadenoma.    She is past due for visual field testing, scheduled with opthalmology 01/2023  She was diagnosed with right try malleolar ankle fracture 06/2022, former mechanical fall  Denies weight changes  Denies vision changes  Denies headaches  Denies nausea or vomiting  Denies palpitations - pending cardiology evaluation  Denies diarrhea or constipation  Has noted LE edema , wears compression socks  She is on Kcl 10 mEq daily   Bedtime is around 11 pm   She is on Vitamin D 5000 iu daily         HISTORY:  Past Medical History:  Past Medical History:  Diagnosis Date   Anemia    Arthritis    Arthritis    Diabetes mellitus without complication (HCC)    HLD (hyperlipidemia)    Hypertension    Pituitary  macroadenoma (HCC)    Past Surgical History:  Past Surgical History:  Procedure Laterality Date   ABDOMINAL HYSTERECTOMY     CESAREAN SECTION     COLONOSCOPY     EYE SURGERY     bilateral cataracts   ORIF ANKLE FRACTURE Right 07/02/2022   Procedure: OPEN TREATMENT OF RIGHT TRIMALLEOLAR ANKLE FRACTURE;  Surgeon: Terance Hart, MD;  Location: [facility] OR;  Service: Orthopedics;  Laterality: Right;  LENGTH OF SURGERY: 150 MINUTES   SYNDESMOSIS REPAIR Right 07/02/2022   Procedure: POSSIBLE OPEN TREATMENT OF SYNDESMOSIS;  Surgeon: Terance Hart, MD;  Location: [facility] OR;  Service: Orthopedics;  Laterality: Right;   Social History:  reports that she has never smoked. She has never used smokeless tobacco. She reports that she does not drink alcohol and does not use drugs. Family History:  Family History  Problem Relation Age of Onset   Breast cancer Paternal Aunt    Other Neg Hx        pituitary abnormality     HOME MEDICATIONS: Allergies as of 12/07/2022       Reactions   Shellfish Allergy Swelling   Lisinopril Other (See Comments)   MD said affects kidneys. No take    Penicillins Hives, Itching, Swelling   ** Tolerated cefazolin Has patient had a PCN reaction causing immediate rash, facial/tongue/throat swelling, SOB or lightheadedness with hypotension: No Has patient had a PCN reaction causing severe rash involving mucus membranes or skin necrosis: No  Has patient had a PCN reaction that required hospitalization: No Has patient had a PCN reaction occurring within the last 10 years: No If all of the above answers are "NO", then may proceed with Cephalosporin use.        Medication List        Accurate as of December 07, 2022 11:58 AM. If you have any questions, ask your nurse or doctor.          STOP taking these medications    acetaminophen 500 MG tablet Commonly known as: TYLENOL Stopped by: Johnney Ou Delane Wessinger   aspirin 325 MG tablet Commonly known as:  Bayer Aspirin Stopped by: Johnney Ou Travin Marik   ibuprofen 200 MG tablet Commonly known as: ADVIL Stopped by: Johnney Ou Nalini Alcaraz   oxyCODONE 5 MG immediate release tablet Commonly known as: Roxicodone Stopped by: Johnney Ou Amayrany Cafaro       TAKE these medications    amLODipine 10 MG tablet Commonly known as: NORVASC Take 10 mg by mouth daily. What changed: Another medication with the same name was removed. Continue taking this medication, and follow the directions you see here. Changed by: Johnney Ou Cordarrel Stiefel   carvedilol 25 MG tablet Commonly known as: COREG Take 25 mg by mouth 2 (two) times daily.   furosemide 20 MG tablet Commonly known as: LASIX Take 20 mg by mouth daily.   isosorbide-hydrALAZINE 20-37.5 MG tablet Commonly known as: BIDIL Take 0.5 tablets by mouth 3 (three) times daily.   potassium chloride 10 MEQ tablet Commonly known as: KLOR-CON Take 10 mEq by mouth daily.   rosuvastatin 5 MG tablet Commonly known as: CRESTOR Take 5 mg by mouth daily.   valsartan 320 MG tablet Commonly known as: DIOVAN Take 1 tablet (320 mg total) by mouth daily.   Vitamin D-3 125 MCG (5000 UT) Tabs Take 5,000 Units by mouth daily.          OBJECTIVE:   PHYSICAL EXAM: VS: BP 122/80 (BP Location: Right Arm, Patient Position: Sitting, Cuff Size: Large)   Pulse 72   Ht 5\' 3"  (1.6 m)   Wt 192 lb (87.1 kg)   SpO2 97%   BMI 34.01 kg/m    EXAM: General: Pt appears well and is in NAD  Neck: General: Supple without adenopathy. Thyroid: Thyroid size normal.  No goiter or nodules appreciated.   Lungs: Clear with good BS bilat   Heart: Auscultation: RRR.  Abdomen: Soft, nontender  Extremities:  BL LE: trace  pretibial edema   Mental Status: Judgment, insight: Intact Orientation: Oriented to time, place, and person Mood and affect: No depression, anxiety, or agitation     DATA REVIEWED:  11/30/2022 Through outside facility BUN 11 CR 0.88 GFR 71 Na  139 K 4.1 Ca 10.8 Alb 4.0 A1c 6.3% TSH 2.260      Latest Reference Range & Units 11/02/22 19:22  Sodium 135 - 145 mmol/L 140  Potassium 3.5 - 5.1 mmol/L 3.4 (L)  Chloride 98 - 111 mmol/L 101  CO2 22 - 32 mmol/L 30  Glucose 70 - 99 mg/dL 161 (H)  BUN 8 - 23 mg/dL 10  Creatinine 0.96 - 0.45 mg/dL 4.09  Calcium 8.9 - 81.1 mg/dL 91.4  Anion gap 5 - 15  9  GFR, Estimated >60 mL/min >60      DXA 08/29/2021   Results:   Lumbar spine L1-L4 Femoral neck (FN) 33% distal radius  T-score  -0.1 RFN: -1.4 LFN: -1.7 -1.4  MRI brain 08/25/2021  Brain: No acute infarct, mass effect or extra-axial collection. No acute or chronic hemorrhage. Normal white matter signal. Generalized volume loss.   Pituitary/Sella: There is an unchanged hypoenhancing mass within the left aspect of the pituitary gland that measures 1.0 cm AP x 1.0 cm TV x 1.2 cm CC . These measurements are unchanged when the prior study is measured at the same level. The infundibulum is midline but shifted upward. There is no mass effect on the optic chiasm or optic nerves. Unchanged left cavernous sinus extension, grade 1 (medial to the median intercarotid line).   Vascular: Major flow voids are preserved.   Skull and upper cervical spine: Normal calvarium and skull base. Visualized upper cervical spine and soft tissues are normal.   Sinuses/Orbits:No paranasal sinus fluid levels or advanced mucosal thickening. No mastoid or middle ear effusion. Normal orbits.   IMPRESSION: Unchanged size and appearance of left-sided pituitary macroadenoma with grade 1 cavernous sinus extension. No mass effect on the optic chiasm or optic nerves.    ASSESSMENT / PLAN / RECOMMENDATIONS:   Pituitary macroadenoma:   -No local symptoms -Pituitary adenoma stable at ~  1.5 cm -MRI 08/2021 showed unchanged pituitary adenoma ( ordered by Dr. Maurice Small), per pt will f/u every 3 yrs with him  -Historically no biochemical  evidence of hypo or hyperpituitarism -Emphasized the importance of having annual visual field testing through ophthalmology -ACTH, cortisol, FSH, prolactin, TFTs, and IGF-1 level came back normal (08/2021) -Will proceed with ACTH, cortisol, prolactin, TFTs -I will also proceed with saliva cortisol testing given hypokalemia, HTN  2. Hyperparathyroidism:  -This is PTH mediated -She has been off HCTZ which has helped improve her serum calcium - DXA - low bone density 08/2021, but given right ankle fracture 06/2022 patient meets clinical diagnosis of osteoporosis - 24 hr urine calcium normal 119 mg  -Patient has not been following appropriate dietary intake of calcium, I have again emphasized the importance of consuming 2-3 servings of dietary calcium to prevent bone loss    Recommendations  Stay hydrated  Avoid OTC calcium  Consume 2-3 dietary calcium daily   3.Osteoporosis:  -This is a clinical diagnosis due to fragility fracture of the right ankle/2024 -I have again emphasized the importance of consuming 2-3 servings of dietary calcium daily -We will consider bisphosphonate therapy in the future   4.HTN:  -She presented to the ED with uncontrolled HTN 11/02/2022.  Given pituitary macroadenoma, hypokalemia, I will screen her for Aldo, renin, and saliva cortisol  Follow-up in 6 months  Signed electronically by: Lyndle Herrlich, MD  [facility]  [facility] [address] [place] [place], [place] [address] Phone: [phone] FAX: [phone]      CC: Lewis Moccasin, MD [address] [place] [address] Phone: [phone]  Fax: [phone]   Return to Endocrinology clinic as below: Future Appointments  Date Time Provider Department Center  12/14/2022  3:40 PM Elder Negus, MD [facility]  12/30/2022  2:00 PM Chilton Si, MD [facility]

## 2022-12-07 NOTE — Patient Instructions (Addendum)
Please have a visual field testing through your eye doctor    Stay Hydrated  Avoid over the counter calcium tablets  Make sure you are eating 2-3 servings of dietary calcium daily ( low fat dairy, green leafy vegetables)    SALIVARY CORTISOL COLLECTION INSTRUCTIONS    Precautions:  Please collect sample at MIDNIGHT. You will need to do this on 2 nights  No food or fluids 30 minutes prior to collection.  Do not use any creams, lotions on hands, or use steroid inhalers 24- hours prior to collection.  Wash hands carefully.  Avoid any activity that could cause your gums to bleed: including flushing of brushing your teeth.  Kit must not be used in children less than 53 years of age, or a person that is at risk for choking on collection kit.  Instructions for saliva collection:   Rinse mouth thoroughly with water and discard. Do not swallow.  Hold the Salivette at the rim of the suspended insert and remove the stopper.  Remove the swab.  Place swab under tongue until well saturated, approximately 1 minute.   Return the saturated swab to the suspended insert and close the Salivette firmly with the stopper.  Do not remove the tube holding the insert. The Salivette should be sent to the lab with the swab.   Come to the lab and leave the Salivette kit for labeling with your identifying information.   Make sure you refrigerate sample if not bringing to the lab immediately. Try to use cold packs for transportation if available.

## 2022-12-09 ENCOUNTER — Ambulatory Visit: Payer: Medicare HMO | Admitting: Cardiology

## 2022-12-11 ENCOUNTER — Ambulatory Visit: Payer: Medicare HMO | Admitting: Cardiology

## 2022-12-14 ENCOUNTER — Encounter: Payer: Self-pay | Admitting: Cardiology

## 2022-12-14 ENCOUNTER — Other Ambulatory Visit: Payer: Medicare HMO

## 2022-12-14 ENCOUNTER — Ambulatory Visit: Payer: Medicare HMO | Attending: Cardiology | Admitting: Cardiology

## 2022-12-14 VITALS — BP 122/62 | HR 71 | Resp 16 | Ht 63.0 in | Wt 192.0 lb

## 2022-12-14 DIAGNOSIS — D352 Benign neoplasm of pituitary gland: Secondary | ICD-10-CM

## 2022-12-14 DIAGNOSIS — I10 Essential (primary) hypertension: Secondary | ICD-10-CM | POA: Diagnosis not present

## 2022-12-14 MED ORDER — AMLODIPINE BESYLATE 5 MG PO TABS: 5.0000 mg | ORAL_TABLET | Freq: Every day | ORAL | 3 refills | Status: AC

## 2022-12-14 NOTE — Patient Instructions (Addendum)
Medication Instructions:  Please decrease Amlodipine to 5 mg a day. Continue all other medications as listed.  *If you need a refill on your cardiac medications before your next appointment, please call your pharmacy*  Testing/Procedures: Your physician has requested that you have an echocardiogram. Echocardiography is a painless test that uses sound waves to create images of your heart. It provides your doctor with information about the size and shape of your heart and how well your heart's chambers and valves are working. This procedure takes approximately one hour. There are no restrictions for this procedure. Please do NOT wear cologne, perfume, aftershave, or lotions (deodorant is allowed). Please arrive 15 minutes prior to your appointment time.  Your physician has requested that you have a renal artery duplex. During this test, an ultrasound is used to evaluate blood flow to the kidneys. Allow one hour for this exam. Do not eat after midnight the day before and avoid carbonated beverages. Take your medications as you usually do.  Follow-Up: At [facility], you and your health needs are our priority.  As part of our continuing mission to provide you with exceptional heart care, we have created designated Provider Care Teams.  These Care Teams include your primary Cardiologist (physician) and Advanced Practice Providers (APPs -  Physician Assistants and Nurse Practitioners) who all work together to provide you with the care you need, when you need it.  We recommend signing up for the patient portal called "MyChart".  Sign up information is provided on this After Visit Summary.  MyChart is used to connect with patients for Virtual Visits (Telemedicine).  Patients are able to view lab/test results, encounter notes, upcoming appointments, etc.  Non-urgent messages can be sent to your provider as well.   To learn more about what you can do with MyChart, go to [email or web address].     Your next appointment:   4 - 6 weeks    Provider:   [facility]

## 2022-12-14 NOTE — Progress Notes (Signed)
  Cardiology Office Note:  .   Date:  12/14/2022  ID:  Elaine Burke, DOB 05/04/1950, MRN [id number] PCP: Lewis Moccasin, MD  [facility] Providers Cardiologist:  Truett Mainland, MD PCP: Lewis Moccasin, MD  Chief Complaint  Patient presents with   Hypertension   Follow-up      History of Present Illness: .    Elaine Burke is a 72 y.o. female with hypertension, pituitary macroadenoma, hyperparathyroidism  Patient is here with her husband.  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  Her physical activity is limited due to arthritis and recent fracture in the right leg.  She denies any chest pain, dyspnea, orthopnea symptoms.  She has controlled leg edema, improved with compression stockings.  In August 2024, she had sudden spike in blood pressure up to 200/100 mmHg, which is subsequently improved after addition of amlodipine, and furosemide.  Patient sees Dr. Lonzo Cloud.  During 12/07/2022 visit with the patient, Dr. Lonzo Cloud had mentioned screening for aldosterone, renin, salivary cortisol.  Pending.  Vitals:   12/14/22 1551  BP: 122/62  Pulse: 71  Resp: 16  SpO2: 97%     ROS:  Review of Systems  Cardiovascular:  Positive for leg swelling. Negative for chest pain, dyspnea on exertion, palpitations and syncope.     Studies Reviewed: Marland Kitchen       Labs 10/2022 Gl 143, K 3.4 HbA1C 6.3% Chol 138, TG 103, HDL 46 LDL 89 (07/2021)    Physical Exam:   Physical Exam Vitals and nursing note reviewed.  Constitutional:      General: She is not in acute distress. Neck:     Vascular: No JVD.  Cardiovascular:     Rate and Rhythm: Normal rate and regular rhythm.     Heart sounds: Normal heart sounds. No murmur heard. Pulmonary:     Effort: Pulmonary effort is normal.     Breath sounds: Normal breath sounds. No wheezing or rales.  Musculoskeletal:     Right lower leg: Edema (1+) present.      Left lower leg: Edema (Trace) present.      VISIT DIAGNOSES:   ICD-10-CM   1. Primary hypertension  I10 ECHOCARDIOGRAM COMPLETE    VAS US RENAL ARTERY DUPLEX    AMB Referral to [facility] Pharm-D       ASSESSMENT AND PLAN: .    Elaine Burke is a 72 y.o. female with hypertension, pituitary macroadenoma, hyperparathyroidism  Hypertension: Now controlled on multiple agents.  Will check echocardiogram to evaluate cardiac function, and renal artery duplex to rule out renal artery stenosis.  Agree with renin/aldosterone, cortisol etc as recommended by Dr. Lonzo Cloud.   Leg edema: At least partly attributed to amlodipine 10 mg daily.  Reduce amlodipine to 5 mg daily.  Recommend follow-up with Pharm.D. in 4 to 6 weeks.  If blood pressure remains controlled, could continue amlodipine at 5 mg daily dose.  I will see her as needed, unless any significant abnormalities found on above testing.    Meds ordered this encounter  Medications   amLODipine (NORVASC) 5 MG tablet    Sig: Take 1 tablet (5 mg total) by mouth daily.    Dispense:  90 tablet    Refill:  3    Pt will call when needed     Signed, Elder Negus, MD

## 2022-12-26 LAB — ACTH: C206 ACTH: 13 pg/mL (ref 6–50)

## 2022-12-26 LAB — PTH, INTACT AND CALCIUM
Calcium: 10.3 mg/dL (ref 8.6–10.4)
PTH: 71 pg/mL (ref 16–77)

## 2022-12-26 LAB — ALDOSTERONE + RENIN ACTIVITY W/ RATIO

## 2022-12-26 LAB — SALIVARY CORTISOL X2, TIMED
Salivary Cortisol 2nd Specimen: 0.094 ug/dL
Salivary Cortisol Baseline: 0.116 ug/dL

## 2022-12-26 LAB — PROLACTIN: Prolactin: 21.2 ng/mL

## 2022-12-26 LAB — CALCIUM, IONIZED: Calcium, Ion: 5.6 mg/dL — ABNORMAL HIGH (ref 4.7–5.5)

## 2022-12-30 ENCOUNTER — Ambulatory Visit (HOSPITAL_BASED_OUTPATIENT_CLINIC_OR_DEPARTMENT_OTHER): Payer: Medicare HMO | Admitting: Cardiovascular Disease

## 2022-12-30 ENCOUNTER — Encounter (HOSPITAL_BASED_OUTPATIENT_CLINIC_OR_DEPARTMENT_OTHER): Payer: Self-pay | Admitting: Cardiovascular Disease

## 2022-12-30 ENCOUNTER — Telehealth: Payer: Self-pay | Admitting: Internal Medicine

## 2022-12-30 DIAGNOSIS — I10 Essential (primary) hypertension: Secondary | ICD-10-CM

## 2022-12-30 DIAGNOSIS — R400 Somnolence: Secondary | ICD-10-CM | POA: Diagnosis not present

## 2022-12-30 DIAGNOSIS — E785 Hyperlipidemia, unspecified: Secondary | ICD-10-CM

## 2022-12-30 DIAGNOSIS — D352 Benign neoplasm of pituitary gland: Secondary | ICD-10-CM

## 2022-12-30 DIAGNOSIS — Z5181 Encounter for therapeutic drug level monitoring: Secondary | ICD-10-CM

## 2022-12-30 DIAGNOSIS — R0683 Snoring: Secondary | ICD-10-CM

## 2022-12-30 HISTORY — DX: Snoring: R06.83

## 2022-12-30 MED ORDER — DEXAMETHASONE 1 MG PO TABS: 1.0000 mg | ORAL_TABLET | Freq: Once | ORAL | 0 refills | Status: AC

## 2022-12-30 NOTE — Patient Instructions (Addendum)
Medication Instructions:  Your physician recommends that you continue on your current medications as directed. Please refer to the Current Medication list given to you today.    Labwork: FASTING LP/CMET/RENIN/ALDOSTERONE/CATACHOLAMINES/METANEPHRINES WHEN YOU GO FOR YOUR OTHER LABS    Testing/Procedures: NONE   Follow-Up: 1 MONTH WITH PHARM D AT EITHER [facility]    Special Instructions:  MONITOR YOUR BLOOD PRESSURE TWICE A DAY, LOG IN THE BOOK PROVIDED. BRING THE BOOK AND YOUR BLOOD PRESSURE MACHINE TO YOUR FOLLOW UP IN 1 MONTH   WatchPAT?  Is a FDA cleared portable home sleep study test that uses a watch and 3 points of contact to monitor 7 different channels, including your heart rate, oxygen saturations, body position, snoring, and chest motion.  The study is easy to use from the comfort of your own home and accurately detect sleep apnea.  Before bed, you attach the chest sensor, attached the sleep apnea bracelet to your nondominant hand, and attach the finger probe.  After the study, the raw data is downloaded from the watch and scored for apnea events.   For more information: [email or web address]  Patient Testing Instructions:  Do not put battery into the device until bedtime when you are ready to begin the test. Please call the support number if you need assistance after following the instructions below: 24 hour support line- [phone] or [employer] support at [phone] (option 2)  Download the IntelWatchPAT One" app through the google play store or App Store  Be sure to turn on or enable access to bluetooth in settlings on your smartphone/ device  Make sure no other bluetooth devices are on and within the vicinity of your smartphone/ device and WatchPAT watch during testing.  Make sure to leave your smart phone/ device plugged in and charging all night.  When ready for bed:  Follow the instructions step by step in the WatchPAT One App to  activate the testing device. For additional instructions, including video instruction, visit the WatchPAT One video on Youtube. You can search for WatchPat One within Youtube (video is 4 minutes and 18 seconds) or enter: https://youtube/watch?v=BCce_vbiwxE Please note: You will be prompted to enter a Pin to connect via bluetooth when starting the test. The PIN will be assigned to you when you receive the test.  The device is disposable, but it recommended that you retain the device until you receive a call letting you know the study has been received and the results have been interpreted.  We will let you know if the study did not transmit to [place] properly after the test is completed. You do not need to call us to confirm the receipt of the test.  Please complete the test within 48 hours of receiving PIN.   Frequently Asked Questions:  What is Watch Dennie Bible one?  A single use fully disposable home sleep apnea testing device and will not need to be returned after completion.  What are the requirements to use WatchPAT one?  The be able to have a successful watchpat one sleep study, you should have your Watch pat one device, your smart phone, watch pat one app, your PIN number and Internet access What type of phone do I need?  You should have a smart phone that uses Android 5.1 and above or any Iphone with IOS 10 and above How can I download the WatchPAT one app?  Based on your device type search for WatchPAT one app either in google play for android devices or  APP store for Iphone's Where will I get my PIN for the study?  Your PIN will be provided by your physician's office. It is used for authentication and if you lose/forget your PIN, please reach out to your providers office.  I do not have Internet at home. Can I do WatchPAT one study?  WatchPAT One needs Internet connection throughout the night to be able to transmit the sleep data. You can use your home/local internet or your cellular's data package.  However, it is always recommended to use home/local Internet. It is estimated that between 20MB-30MB will be used with each study.However, the application will be looking for space in the phone to start the study.  What happens if I lose internet or bluetooth connection?  During the internet disconnection, your phone will not be able to transmit the sleep data. All the data, will be stored in your phone. As soon as the internet connection is back on, the phone will being sending the sleep data. During the bluetooth disconnection, WatchPAT one will not be able to to send the sleep data to your phone. Data will be kept in the [facility] one until two devices have bluetooth connection back on. As soon as the connection is back on, WatchPAT one will send the sleep data to the phone.  How long do I need to wear the WatchPAT one?  After you start the study, you should wear the device at least 6 hours.  How far should I keep my phone from the device?  During the night, your phone should be within 15 feet.  What happens if I leave the room for restroom or other reasons?  Leaving the room for any reason will not cause any problem. As soon as your get back to the room, both devices will reconnect and will continue to send the sleep data. Can I use my phone during the sleep study?  Yes, you can use your phone as usual during the study. But it is recommended to put your watchpat one on when you are ready to go to bed.  How will I get my study results?  A soon as you completed your study, your sleep data will be sent to the provider. They will then share the results with you when they are ready.

## 2022-12-30 NOTE — Progress Notes (Signed)
Advanced Hypertension Clinic Initial Assessment:    Date:  12/30/2022   ID:  Elaine Burke, DOB 04-26-1950, MRN [id number]  PCP:  Lewis Moccasin, MD  Cardiologist:  None  Nephrologist:  Referring MD: Lewis Moccasin, MD   CC: Hypertension  History of Present Illness:    Elaine Burke is a 72 y.o. female with a hx of hypertension, hyperlipidemia, diabetes, pituitary macroadenoma, hyperparathyroidism, arthritis, here to establish care in the Advanced Hypertension Clinic.   She presented to the ED 11/02/2022 with complaints of occasional blood pressures in the 200s systolic on a regimen of Bidil, Coreg, valsartan, and Lasix. She normally took amlodipine but this had been held due to LE edema which was being treated with Lasix and compression stockings. She was asymptomatic. Coreg was administered with subsequent improvement in her blood pressures. EKG was unremarkable. Discharged with recommendations to restart amlodipine if her blood pressure remained elevated. Subsequently her amlodipine was increased to 10 mg due to elevated blood pressures.  On 12/07/2022 she saw her endocrinologist Dr. Lonzo Cloud. Cortisol levels were normal. Renin/aldosterone labs were ordered but canceled due to lack of specimens.  She was initially seen in our practice by Dr. Rosemary Holms 12/14/2022. 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 Her blood pressure was 122/62 in the office. She had LE edema that was managed with compression stockings, and partly attributable to amlodipine 10 mg. Her amlodipine was reduced to 5 mg daily, recommended for close BP follow-up with our pharmacist.   Echocardiogram and renal artery dopplers are currently scheduled for 01/01/23 and 01/06/23, respectfully.   Today, she is accompanied by her husband. Generally she has been feeling okay. In the office her blood pressure is 145/84 initially, and 158/84 (right  arm), 156/80 (left arm) on manual recheck. She presents a blood pressure log which is personally reviewed, with average readings in the 120s/80s, some lows to the 90s systolic, rarely in the 130s systolic. For the past 3 weeks she has been participating in OT and PT. She had surgery to treat a right ankle fracture in late April. She confirms having balance issues and uses a walker for assistance. She has some LE edema, R>L. Her swelling had started before she started her amlodipine. Usually she wears compression socks. She is sitting for most of the day. She does get up and try to walk every hour as recommended by her physical therapist. She both prepares meals at home and orders out. When cooking at home she rarely uses salt, and never adds extra salt at the table. May drink tea occasionally, and coke zero three times a week. No alcohol consumption. For pain management, she seldom takes Aleve usually for knee pain. She confirms that she snores, and her husband has noticed occasional apneic episodes. Usually she feels well rested in the mornings. She denies any palpitations, chest pain, shortness of breath, lightheadedness, headaches, syncope.  Previous antihypertensives: N/a  Past Medical History:  Diagnosis Date   Anemia    Arthritis    Arthritis    Diabetes mellitus without complication (HCC)    HLD (hyperlipidemia)    Hypertension    Pituitary macroadenoma (HCC)    Snoring 12/30/2022    Past Surgical History:  Procedure Laterality Date   ABDOMINAL HYSTERECTOMY     CESAREAN SECTION     COLONOSCOPY     EYE SURGERY     bilateral cataracts   ORIF ANKLE FRACTURE Right  07/02/2022   Procedure: OPEN TREATMENT OF RIGHT TRIMALLEOLAR ANKLE FRACTURE;  Surgeon: Terance Hart, MD;  Location: [facility] OR;  Service: Orthopedics;  Laterality: Right;  LENGTH OF SURGERY: 150 MINUTES   SYNDESMOSIS REPAIR Right 07/02/2022   Procedure: POSSIBLE OPEN TREATMENT OF SYNDESMOSIS;  Surgeon: Terance Hart,  MD;  Location: [facility] OR;  Service: Orthopedics;  Laterality: Right;    Current Medications: Current Meds  Medication Sig   amLODipine (NORVASC) 5 MG tablet Take 1 tablet (5 mg total) by mouth daily.   carvedilol (COREG) 25 MG tablet Take 25 mg by mouth 2 (two) times daily.   Cholecalciferol (VITAMIN D-3) 125 MCG (5000 UT) TABS Take 5,000 Units by mouth daily.   dexamethasone (DECADRON) 1 MG tablet Take 1 tablet (1 mg total) by mouth once for 1 dose.   furosemide (LASIX) 20 MG tablet Take 20 mg by mouth daily.   isosorbide-hydrALAZINE (BIDIL) 20-37.5 MG tablet Take 0.5 tablets by mouth 3 (three) times daily.   potassium chloride (KLOR-CON) 10 MEQ tablet Take 10 mEq by mouth daily.   rosuvastatin (CRESTOR) 5 MG tablet Take 5 mg by mouth daily.   valsartan (DIOVAN) 320 MG tablet Take 1 tablet (320 mg total) by mouth daily.     Allergies:   Shellfish allergy, Lisinopril, and Penicillins   Social History   Socioeconomic History   Marital status: Married    Spouse name: Not on file   Number of children: 1   Years of education: Not on file   Highest education level: Not on file  Occupational History   Not on file  Tobacco Use   Smoking status: Never   Smokeless tobacco: Never  Vaping Use   Vaping status: Never Used  Substance and Sexual Activity   Alcohol use: No   Drug use: No   Sexual activity: Not on file  Other Topics Concern   Not on file  Social History Narrative   Not on file   Social Determinants of Health   Financial Resource Strain: Not on file  Food Insecurity: No Food Insecurity (07/02/2022)   Hunger Vital Sign    Worried About Running Out of Food in the Last Year: Never true    Ran Out of Food in the Last Year: Never true  Transportation Needs: No Transportation Needs (07/02/2022)   PRAPARE - Administrator, Civil Service (Medical): No    Lack of Transportation (Non-Medical): No  Physical Activity: Not on file  Stress: Not on file  Social  Connections: Not on file     Family History: The patient's family history includes Breast cancer in her paternal aunt; Dementia in her mother; Heart attack (age of onset: 35) in her mother; Hypertension in her brother, mother, and sister; Other in her father; Stroke in her paternal grandfather.  ROS:   Please see the history of present illness.    (+) LE edema, R>L (+) Snoring (+) PND All other systems reviewed and are negative.  EKGs/Labs/Other Studies Reviewed:    EKG:  EKG is personally reviewed. 12/30/2022: Not ordered.  Recent Labs: 11/02/2022: BUN 10; Creatinine, Ser 0.88; Hemoglobin 14.5; Platelets 300; Sodium 140 12/07/2022: Potassium 3.7; TSH 2.15   Recent Lipid Panel No results found for: "CHOL", "TRIG", "HDL", "CHOLHDL", "VLDL", "LDLCALC", "LDLDIRECT"  Physical Exam:    VS:  BP (!) 156/80 (BP Location: Left Arm, Patient Position: Sitting, Cuff Size: Large)   Pulse 62   Ht 5\' 3"  (1.6 m)   Wt  190 lb (86.2 kg)   SpO2 97%   BMI 33.66 kg/m  , BMI Body mass index is 33.66 kg/m. GENERAL:  Well appearing; ambulates independently in wheelchair. HEENT: Pupils equal round and reactive, fundi not visualized, oral mucosa unremarkable NECK:  No jugular venous distention, waveform within normal limits, carotid upstroke brisk and symmetric, no bruits, no thyromegaly LUNGS:  Clear to auscultation bilaterally HEART:  RRR.  PMI not displaced or sustained, S1 and S2 within normal limits, no S3, no S4, no clicks, no rubs, no murmurs ABD:  Flat, positive bowel sounds normal in frequency in pitch, no bruits, no rebound, no guarding, no midline pulsatile mass, no hepatomegaly, no splenomegaly EXT:  2 plus pulses throughout, 2+ LE edema to mid tibia bilaterally, no cyanosis, no clubbing SKIN:  No rashes, no nodules NEURO:  Cranial nerves II through XII grossly intact, motor grossly intact throughout PSYCH:  Cognitively intact, oriented to person place and time   ASSESSMENT/PLAN:    #  Hypertension Recent increase in blood pressure readings, with some values in the 200s/100s. Currently on Amlodipine 5mg  daily. Recent home readings have been more controlled, mostly around 120s/80s.  She may have white coat HTN superimposed on essential HTN.   -Continue current medication regimen.  Amlodipine, carvedilol, Bidil, and valsartan.   -Check blood pressure twice daily for a month and record readings. -Schedule follow-up appointment in one month to review home blood pressure readings and verify accuracy of home blood pressure cuff. -Exercise is limited by her foot injury/surgery and inability to walk long distances.   -Check renin/aldo, catecholamines and metanephrines. -Continue with planned dexamethasone suppression test as ordered by endocrinologist. -Renal Dopplers are pending.  # Lower extremity edema Noted prior to initiation of Amlodipine. No symptoms of heart failure. -Continue use of compression socks. -Echocardiogram scheduled to evaluate cardiac function. -If no cardiac causes are found for her edema, consider switching amlodipine to spironolactone.  General Health Maintenance - will get copy of lipids from her PCP.     Screening for Secondary Hypertension:     12/30/2022    2:08 PM  Causes  Drugs/Herbals Screened     - Comments limits salt but eats out.  Occasional tea. Coke Zero 3/week.  No EtOH.  +Aleve rarely  Renovascular HTN Screened  Sleep Apnea Screened     - Comments +snores.  apnea.  Thyroid Disease Screened  Hyperaldosteronism Screened     - Comments check renin and aldosterone  Pheochromocytoma Screened     - Comments check catecholamines and metanephrines  Cushing's Syndrome Screened     - Comments ongoing screening per endocrine  Hyperparathyroidism Screened  Coarctation of the Aorta Screened     - Comments BP initially assymmetric but wnl on repeat  Compliance Screened    Relevant Labs/Studies:    Latest Ref Rng & Units 12/07/2022   12:25  PM 11/02/2022    7:22 PM 07/02/2022   11:46 AM  Basic Labs  Sodium 135 - 145 mmol/L  140  135   Potassium 3.5 - 5.1 mEq/L 3.7  3.4  3.6   Creatinine 0.44 - 1.00 mg/dL  9.62  9.52        Latest Ref Rng & Units 12/07/2022   12:25 PM 02/23/2022   12:18 PM  Thyroid   TSH 0.35 - 5.50 uIU/mL 2.15  1.63        12/07/2022   12:25 PM  Renin/Aldosterone   Aldosterone CANCELED  Latest Ref Rng & Units 12/07/2022   12:25 PM 08/29/2021   10:42 AM 07/10/2020   11:34 AM 11/08/2019   10:39 AM 07/19/2019    8:31 AM  Cortisol  Cortisol  ug/dL 9.9  9.6  72.5  36.6  0.7        12/14/2022    4:25 PM  Renovascular   Renal Artery [place] Completed Yes    Disposition:    FU with APP/PharmD in 1 month. FU with Renee Erb C. Duke Salvia, MD, [facility] in 4 months.  Medication Adjustments/Labs and Tests Ordered: Current medicines are reviewed at length with the patient today.  Concerns regarding medicines are outlined above.   Orders Placed This Encounter  Procedures   Metanephrines, plasma   Catecholamines, fractionated, plasma   Aldosterone + renin activity w/ ratio   [facility] Sleep Study   No orders of the defined types were placed in this encounter.  I,Mathew Stumpf,acting as a Neurosurgeon for Chilton Si, MD.,have documented all relevant documentation on the behalf of Chilton Si, MD,as directed by  Chilton Si, MD while in the presence of Chilton Si, MD.  I, Rishab Stoudt C. Duke Salvia, MD have reviewed all documentation for this visit.  The documentation of the exam, diagnosis, procedures, and orders on 12/30/2022 are all accurate and complete.   Signed, Chilton Si, MD  12/30/2022 2:38 PM    [facility]

## 2022-12-30 NOTE — Telephone Encounter (Signed)
Please let the patient know that her saliva cortisol testing came back inconclusive with 1 results being normal and the other 1 abnormal.   Based on this we will need to proceed with another test called dexamethasone suppression test Which will require her to take dexamethasone tablet at 11:30 at night and present the next morning for an 8 AM fasting lab appointment    The patient will need to first be scheduled for a fasting 8 AM lab at her convenience, and the prior night she will take 1 tablet of dexamethasone 1 mg at EXACTLY 11:30 PM the night before her lab test.   Please emphasize the importance of timing as this is important   Thanks

## 2022-12-31 NOTE — Telephone Encounter (Signed)
Patient scheduled for Monday but I don't see the Dexamethasone ordered.

## 2023-01-01 ENCOUNTER — Ambulatory Visit (HOSPITAL_COMMUNITY): Payer: Medicare HMO | Attending: Cardiology

## 2023-01-01 DIAGNOSIS — I10 Essential (primary) hypertension: Secondary | ICD-10-CM

## 2023-01-01 LAB — ECHOCARDIOGRAM COMPLETE
Area-P 1/2: 4.18 cm2
S' Lateral: 2.1 cm

## 2023-01-04 ENCOUNTER — Other Ambulatory Visit (INDEPENDENT_AMBULATORY_CARE_PROVIDER_SITE_OTHER): Payer: Medicare HMO

## 2023-01-04 DIAGNOSIS — D352 Benign neoplasm of pituitary gland: Secondary | ICD-10-CM | POA: Diagnosis not present

## 2023-01-04 LAB — CORTISOL: Cortisol, Plasma: 0.7 ug/dL

## 2023-01-06 ENCOUNTER — Ambulatory Visit (HOSPITAL_COMMUNITY)
Admission: RE | Admit: 2023-01-06 | Discharge: 2023-01-06 | Disposition: A | Discharge status: Home / Self Care | Payer: Medicare HMO | Source: Ambulatory Visit | Attending: Internal Medicine | Admitting: Internal Medicine

## 2023-01-06 DIAGNOSIS — I10 Essential (primary) hypertension: Secondary | ICD-10-CM | POA: Insufficient documentation

## 2023-01-25 ENCOUNTER — Ambulatory Visit: Payer: Medicare HMO | Attending: Cardiology | Admitting: Pharmacist Clinician (PhC)/ Clinical Pharmacy Specialist

## 2023-01-25 ENCOUNTER — Ambulatory Visit (HOSPITAL_BASED_OUTPATIENT_CLINIC_OR_DEPARTMENT_OTHER): Payer: Medicare HMO | Admitting: Family

## 2023-01-25 VITALS — BP 134/85 | HR 52

## 2023-01-25 DIAGNOSIS — I1A0 Resistant hypertension: Secondary | ICD-10-CM | POA: Diagnosis not present

## 2023-01-25 NOTE — Patient Instructions (Signed)
Follow up appointment: I will call you in January to set a February appointment at the [facility] office  Take your BP meds as follows:  No changes to medications today  Check your blood pressure at home daily (if able) and keep record of the readings.  Hypertension "High blood pressure"  Hypertension is often called "The Silent Killer." It rarely causes symptoms until it is extremely  high or has done damage to other organs in the body. For this reason, you should have your  blood pressure checked regularly by your physician. We will check your blood pressure  every time you see a provider at one of our offices.   Your blood pressure reading consists of two numbers. Ideally, blood pressure should be  below 120/80. The first ("top") number is called the systolic pressure. It measures the  pressure in your arteries as your heart beats. The second ("bottom") number is called the diastolic pressure. It measures the pressure in your arteries as the heart relaxes between beats.  The benefits of getting your blood pressure under control are enormous. A 10-point  reduction in systolic blood pressure can reduce your risk of stroke by 27% and heart failure by 28%  Your blood pressure goal is < 130/80  To check your pressure at home you will need to:  1. Sit up in a chair, with feet flat on the floor and back supported. Do not cross your ankles or legs. 2. Rest your left arm so that the cuff is about heart level. If the cuff goes on your upper arm,  then just relax the arm on the table, arm of the chair or your lap. If you have a wrist cuff, we  suggest relaxing your wrist against your chest (think of it as Pledging the Flag with the  wrong arm).  3. Place the cuff snugly around your arm, about 1 inch above the crook of your elbow. The  cords should be inside the groove of your elbow.  4. Sit quietly, with the cuff in place, for about 5 minutes. After that 5 minutes press the power  button  to start a reading. 5. Do not talk or move while the reading is taking place.  6. Record your readings on a sheet of paper. Although most cuffs have a memory, it is often  easier to see a pattern developing when the numbers are all in front of you.  7. You can repeat the reading after 1-3 minutes if it is recommended  Make sure your bladder is empty and you have not had caffeine or tobacco within the last 30 min  Always bring your blood pressure log with you to your appointments. If you have not brought your monitor in to be double checked for accuracy, please bring it to your next appointment.  You can find a list of quality blood pressure cuffs at validatebp.org

## 2023-01-25 NOTE — Progress Notes (Unsigned)
Office Visit    Patient Name: Elaine Burke Date of Encounter: 01/26/2023  Primary Care Provider:  Lewis Moccasin, MD Primary Cardiologist:  None  Chief Complaint    Hypertension - Advanced hypertension clinic  Past Medical History     Allergies  Allergen Reactions   Shellfish Allergy Swelling   Lisinopril Other (See Comments)    MD said affects kidneys. No take    Penicillins Hives, Itching and Swelling    ** Tolerated cefazolin Has patient had a PCN reaction causing immediate rash, facial/tongue/throat swelling, SOB or lightheadedness with hypotension: No Has patient had a PCN reaction causing severe rash involving mucus membranes or skin necrosis: No Has patient had a PCN reaction that required hospitalization: No Has patient had a PCN reaction occurring within the last 10 years: No If all of the above answers are "NO", then may proceed with Cephalosporin use.     History of Present Illness    Elaine Burke is a 72 y.o. female patient who was referred to the Advanced Hypertension Clinic with a hx of hypertension, hyperlipidemia, diabetes, pituitary macroadenoma, hyperparathyroidism, and arthritis. She was seen by Dr. Duke Salvia on 12/30/22. Her blood pressure was 156/80 in office, but more controlled at home. It was noted that she may have white coat hypertension superimposed on essential hypertension. She is taking amlodipine, carvedilol, Bidil, and valsartan for hypertension control. During her visit, she was instructed to check her blood pressure twice daily and record her readings. During that time, she was also experiencing some lower extremity edema, noted prior to initiation of amlodipine. She was taking amlodipine 10 mg, but this was reduced to 5 mg daily. No symptoms of heart failure. ECHO taken on 01/01/23 showed EF of 60-65%. She had been wearing compression socks to help with her LE edema. She had surgery to treat a right ankle fracture in late April.  Exercise for her is limited with the inability to walk long distances.        Today the patient is seen for hypertension follow-up. She has been adhering to her medications. Her blood pressure in the office today was 134/85. She has been checking her blood pressure at home. Her blood pressure machine ([employer] brand) does seem to read ~10 points higher. Her home readings averaged out to be 129/79 in the mornings and 126/80 in the evenings. She did clarify that she is taking Bidil whole and is no longer splitting the tablets in half. Overall, she seems to be doing well. She does continue to have some edema in her ankles, and she wears compression socks for this. Her diet at home consists 50/50 of home cooked meals and take-out. She does eat vegetables daily. For snacks, she enjoys apples, grapes, and chips.  She also enjoys diet coke zero. Most of her exercise comes from physical therapy sessions.   Blood Pressure Goal:  130/80  Current Medications: amlodipine 5 mg every day, carvedilol 25 mg bid, valsartan 320 mg every day, Bidil 20/37.5 mg tid  Previously tried:  lisinopril - "affected kidneys"     Accessory Clinical Findings    Lab Results  Component Value Date   CREATININE 0.88 11/02/2022   BUN 10 11/02/2022   NA 140 11/02/2022   K 3.7 12/07/2022   CL 101 11/02/2022   CO2 30 11/02/2022   Lab Results  Component Value Date   ALT 50 (H) 05/08/2020   AST 38 05/08/2020   ALKPHOS 106 05/08/2020   BILITOT 0.3  05/08/2020   Lab Results  Component Value Date   HGBA1C 6.9 (H) 04/22/2020    Screening for Secondary Hypertension:      12/30/2022    2:08 PM  Causes  Drugs/Herbals Screened     - Comments limits salt but eats out.  Occasional tea. Coke Zero 3/week.  No EtOH.  +Aleve rarely  Renovascular HTN Screened  Sleep Apnea Screened     - Comments +snores.  apnea.  Thyroid Disease Screened  Hyperaldosteronism Screened     - Comments check renin and aldosterone  Pheochromocytoma  Screened     - Comments check catecholamines and metanephrines  Cushing's Syndrome Screened     - Comments ongoing screening per endocrine  Hyperparathyroidism Screened  Coarctation of the Aorta Screened     - Comments BP initially assymmetric but wnl on repeat  Compliance Screened    Relevant Labs/Studies:    Latest Ref Rng & Units 12/07/2022   12:25 PM 11/02/2022    7:22 PM 07/02/2022   11:46 AM  Basic Labs  Sodium 135 - 145 mmol/L  140  135   Potassium 3.5 - 5.1 mEq/L 3.7  3.4  3.6   Creatinine 0.44 - 1.00 mg/dL  1.61  0.96        Latest Ref Rng & Units 12/07/2022   12:25 PM 02/23/2022   12:18 PM  Thyroid   TSH 0.35 - 5.50 uIU/mL 2.15  1.63        12/07/2022   12:25 PM  Renin/Aldosterone   Aldosterone CANCELED           Latest Ref Rng & Units 01/04/2023    9:07 AM 12/07/2022   12:25 PM 08/29/2021   10:42 AM 07/10/2020   11:34 AM 11/08/2019   10:39 AM 07/19/2019    8:31 AM  Cortisol  Cortisol  ug/dL 0.7  9.9  9.6  04.5  40.9  0.7        01/06/2023   11:21 AM  Renovascular   Renal Artery [place] Completed Yes      Home Medications    Current Outpatient Medications  Medication Sig Dispense Refill   amLODipine (NORVASC) 5 MG tablet Take 1 tablet (5 mg total) by mouth daily. 90 tablet 3   carvedilol (COREG) 25 MG tablet Take 25 mg by mouth 2 (two) times daily.     Cholecalciferol (VITAMIN D-3) 125 MCG (5000 UT) TABS Take 5,000 Units by mouth daily.     furosemide (LASIX) 20 MG tablet Take 20 mg by mouth daily.     isosorbide-hydrALAZINE (BIDIL) 20-37.5 MG tablet Take 0.5 tablets by mouth 3 (three) times daily. 45 tablet 11   potassium chloride (KLOR-CON) 10 MEQ tablet Take 10 mEq by mouth daily.     rosuvastatin (CRESTOR) 5 MG tablet Take 5 mg by mouth daily.     valsartan (DIOVAN) 320 MG tablet Take 1 tablet (320 mg total) by mouth daily. 90 tablet 1   No current facility-administered medications for this visit.     Assessment & Plan       Resistant  hypertension Goal <130/80. Her blood pressure in the office today was 134/85. Her home readings averaged out to be 129/79 in the mornings and 126/80 in the evenings. Her blood pressure machine, tested in office today, does seem to read ~10 points higher. Considering her average home BP readings, her blood pressure is controlled. Therefore, no changes to her medications were recommended today.   P: The patient will  continue with all medications. We will contact her in January to schedule a follow up in late February. She will follow up with her endocrinologist in April.   Buddy Duty PharmD Candidate [facility] [employer] of 2025  I was with student and patient for entire visit and agree with above assessment and plan.   Phillips Hay PharmD CPP [facility]  [address] [place], [place] [address] [phone]

## 2023-01-26 ENCOUNTER — Encounter: Payer: Self-pay | Admitting: Pharmacist Clinician (PhC)/ Clinical Pharmacy Specialist

## 2023-01-26 NOTE — Assessment & Plan Note (Signed)
Goal <130/80. Her blood pressure in the office today was 134/85. Her home readings averaged out to be 129/79 in the mornings and 126/80 in the evenings. Her blood pressure machine, tested in office today, does seem to read ~10 points higher. Considering her average home BP readings, her blood pressure is controlled. Therefore, no changes to her medications were recommended today.   P: The patient will continue with all medications. We will contact her in January to schedule a follow up in late February. She will follow up with her endocrinologist in April.

## 2023-04-14 ENCOUNTER — Telehealth (HOSPITAL_BASED_OUTPATIENT_CLINIC_OR_DEPARTMENT_OTHER): Payer: Self-pay | Admitting: *Deleted

## 2023-04-14 DIAGNOSIS — R400 Somnolence: Secondary | ICD-10-CM

## 2023-04-14 DIAGNOSIS — R0683 Snoring: Secondary | ICD-10-CM

## 2023-04-14 DIAGNOSIS — I1A0 Resistant hypertension: Secondary | ICD-10-CM

## 2023-04-14 NOTE — Telephone Encounter (Signed)
 Patient had [facility] home sleep study ordered 10/23, no activation code given Will forward to sleep team for follow up

## 2023-04-16 DIAGNOSIS — R0681 Apnea, not elsewhere classified: Secondary | ICD-10-CM

## 2023-04-16 DIAGNOSIS — R0683 Snoring: Secondary | ICD-10-CM

## 2023-04-16 NOTE — Telephone Encounter (Signed)
**Note De-Identified Yamna Mackel Obfuscation** Ordering provider: Dr Raford Associated diagnoses: Snoring-R06.83 and apneic episodes-R06.81 WatchPAT PA obtained on 04/16/2023 by Makaia Rappa, Avelina HERO, LPN. Authorization: Per the [facility] Provider Portal, No PA is required for  CPT Code: 04199 ([facility]-HST) Patient notified of PIN ([phone]) on 04/16/2023 Gunnison Chahal Notification Method: phone.  Phone note routed to covering staff for follow-up.

## 2023-04-26 ENCOUNTER — Ambulatory Visit (HOSPITAL_BASED_OUTPATIENT_CLINIC_OR_DEPARTMENT_OTHER): Payer: Medicare HMO

## 2023-05-04 NOTE — Telephone Encounter (Signed)
 Mychart message sent to follow up.

## 2023-05-07 ENCOUNTER — Other Ambulatory Visit: Payer: Self-pay | Admitting: Family Medicine

## 2023-05-07 DIAGNOSIS — Z1231 Encounter for screening mammogram for malignant neoplasm of breast: Secondary | ICD-10-CM

## 2023-05-10 NOTE — Telephone Encounter (Signed)
 No response, test not completed or returned, message sent to billing.

## 2023-05-12 ENCOUNTER — Ambulatory Visit
Admission: RE | Admit: 2023-05-12 | Discharge: 2023-05-12 | Disposition: A | Discharge status: Home / Self Care | Payer: Medicare HMO | Source: Ambulatory Visit | Attending: Family Medicine | Admitting: Family Medicine

## 2023-05-12 DIAGNOSIS — Z1231 Encounter for screening mammogram for malignant neoplasm of breast: Secondary | ICD-10-CM

## 2023-06-09 ENCOUNTER — Ambulatory Visit (INDEPENDENT_AMBULATORY_CARE_PROVIDER_SITE_OTHER): Payer: Medicare HMO | Admitting: Internal Medicine

## 2023-06-09 ENCOUNTER — Encounter: Payer: Self-pay | Admitting: Internal Medicine

## 2023-06-09 VITALS — BP 122/74 | HR 75 | Ht 63.0 in | Wt 202.0 lb

## 2023-06-09 DIAGNOSIS — D352 Benign neoplasm of pituitary gland: Secondary | ICD-10-CM | POA: Diagnosis not present

## 2023-06-09 DIAGNOSIS — M810 Age-related osteoporosis without current pathological fracture: Secondary | ICD-10-CM | POA: Diagnosis not present

## 2023-06-09 DIAGNOSIS — E213 Hyperparathyroidism, unspecified: Secondary | ICD-10-CM

## 2023-06-09 DIAGNOSIS — I10 Essential (primary) hypertension: Secondary | ICD-10-CM | POA: Diagnosis not present

## 2023-06-09 MED ORDER — ALENDRONATE SODIUM 70 MG PO TABS
70.0000 mg | ORAL_TABLET | ORAL | 3 refills | Status: DC
Start: 2023-06-09 — End: 2023-12-10

## 2023-06-09 NOTE — Patient Instructions (Signed)
 Start Alendronate ( Fosamax) 70 mg weekly    Stay Hydrated  Avoid over the counter calcium tablets  Make sure you are eating 2-3 servings of dietary calcium daily ( low fat dairy, green leafy vegetables)

## 2023-06-09 NOTE — Progress Notes (Unsigned)
 Name: Elaine Burke  MRN/ DOB: [id number], 1950-10-21    Age/ Sex: 73 y.o., female     PCP: Lewis Moccasin, MD   Reason for Endocrinology Evaluation: Pituitary adenoma /hypercalcemia      Initial Endocrinology Clinic Visit: 07/18/2019    PATIENT IDENTIFIER: Elaine Burke is a 73 y.o., female with a past medical history of pituitary macroadenoma. She has followed with [facility] since 07/18/2019 for consultative assistance with management of her pituitary adenoma .   HISTORICAL SUMMARY: The patient was first diagnosed with 1.5 cm pituitary macroadenoma in 2018 during evaluation for unsteady gait.  Historically hormonal levels and pituitary imaging has been stable  No prior exposure to XRT   HYPERPARATHYROIDISM HISTORY: Patient has been noted with hypercalcemia since 2018, with a max level of 11.5 mg/dL (uncorrected) on 95/62/1308, with inappropriately normal PTH with a max level of 61 PG/mL 02/19/2021  Serum calcium improved with discontinuation of HCTZ  DXA - low bone  density 08/2021 24 hr urine calcium 119 (08/2021)   SUBJECTIVE:     Today (06/09/2023):  Elaine Burke is here for a follow-up on pituitary macroadenoma.    She is past due for visual field testing, scheduled with opthalmology 01/2023  Patient has been following up with the hypertension clinic for resistant hypertension  Denies headaches  Did not see the ophthalmologist in 01/2023 No vision changes  Denies nausea or vomiting  Has noted with weight gain  Denies diarrhea or constipation  Denies palpitations Denies dizziness or pre-syncope  Denies polydipsia   She consumes 2 servings of dietary calcium daily  She is on Vitamin D 5000 iu daily         HISTORY:  Past Medical History:  Past Medical History:  Diagnosis Date   Anemia    Arthritis    Arthritis    Diabetes mellitus without complication (HCC)    HLD (hyperlipidemia)    Hypertension     Pituitary macroadenoma (HCC)    Snoring 12/30/2022   Past Surgical History:  Past Surgical History:  Procedure Laterality Date   ABDOMINAL HYSTERECTOMY     CESAREAN SECTION     COLONOSCOPY     EYE SURGERY     bilateral cataracts   ORIF ANKLE FRACTURE Right 07/02/2022   Procedure: OPEN TREATMENT OF RIGHT TRIMALLEOLAR ANKLE FRACTURE;  Surgeon: Terance Hart, MD;  Location: [facility] OR;  Service: Orthopedics;  Laterality: Right;  LENGTH OF SURGERY: 150 MINUTES   SYNDESMOSIS REPAIR Right 07/02/2022   Procedure: POSSIBLE OPEN TREATMENT OF SYNDESMOSIS;  Surgeon: Terance Hart, MD;  Location: [facility] OR;  Service: Orthopedics;  Laterality: Right;   Social History:  reports that she has never smoked. She has never used smokeless tobacco. She reports that she does not drink alcohol and does not use drugs. Family History:  Family History  Problem Relation Age of Onset   Hypertension Mother    Heart attack Mother 52   Dementia Mother    Other Father    Hypertension Sister    Hypertension Brother    Breast cancer Paternal Aunt    Stroke Paternal Grandfather      HOME MEDICATIONS: Allergies as of 06/09/2023       Reactions   Shellfish Allergy Swelling   Lisinopril Other (See Comments)   MD said affects kidneys. No take    Penicillins Hives, Itching, Swelling   ** Tolerated cefazolin Has patient had a PCN reaction causing immediate rash, facial/tongue/throat swelling,  SOB or lightheadedness with hypotension: No Has patient had a PCN reaction causing severe rash involving mucus membranes or skin necrosis: No Has patient had a PCN reaction that required hospitalization: No Has patient had a PCN reaction occurring within the last 10 years: No If all of the above answers are "NO", then may proceed with Cephalosporin use.        Medication List        Accurate as of June 09, 2023 12:16 PM. If you have any questions, ask your nurse or doctor.          amLODipine 5 MG  tablet Commonly known as: NORVASC Take 1 tablet (5 mg total) by mouth daily. What changed: Another medication with the same name was removed. Continue taking this medication, and follow the directions you see here. Changed by: Johnney Ou Evonda Enge   carvedilol 25 MG tablet Commonly known as: COREG Take 25 mg by mouth 2 (two) times daily.   furosemide 20 MG tablet Commonly known as: LASIX Take 20 mg by mouth daily.   isosorbide-hydrALAZINE 20-37.5 MG tablet Commonly known as: BIDIL Take 0.5 tablets by mouth 3 (three) times daily. What changed: how much to take   potassium chloride 10 MEQ tablet Commonly known as: KLOR-CON Take 10 mEq by mouth daily.   rosuvastatin 5 MG tablet Commonly known as: CRESTOR Take 5 mg by mouth daily.   valsartan 320 MG tablet Commonly known as: DIOVAN Take 1 tablet (320 mg total) by mouth daily.   Vitamin D-3 125 MCG (5000 UT) Tabs Take 5,000 Units by mouth daily.          OBJECTIVE:   PHYSICAL EXAM: VS: BP 122/74 (BP Location: Left Arm, Patient Position: Sitting, Cuff Size: Normal)   Pulse 75   Ht 5\' 3"  (1.6 m)   Wt 202 lb (91.6 kg)   SpO2 97%   BMI 35.78 kg/m    EXAM: General: Pt appears well and is in NAD  Neck: General: Supple without adenopathy. Thyroid: Thyroid size normal.  No goiter or nodules appreciated.   Lungs: Clear with good BS bilat   Heart: Auscultation: RRR.  Abdomen: Soft, nontender  Extremities:  BL LE: trace  pretibial edema   Mental Status: Judgment, insight: Intact Orientation: Oriented to time, place, and person Mood and affect: No depression, anxiety, or agitation     DATA REVIEWED:      Latest Reference Range & Units 06/09/23 12:42  Sodium 135 - 146 mmol/L 140  Potassium 3.5 - 5.3 mmol/L 4.1  Chloride 98 - 110 mmol/L 102  CO2 20 - 32 mmol/L 30  Glucose 65 - 99 mg/dL 161 (H)  BUN 7 - 25 mg/dL 15  Creatinine 0.96 - 0.45 mg/dL 4.09  Calcium 8.6 - 81.1 mg/dL 91.4 (H)  BUN/Creatinine Ratio 6  - 22 (calc) SEE NOTE:  eGFR > OR = 60 mL/min/1.80m2 64    Latest Reference Range & Units 06/09/23 12:42  Vitamin D, 25-Hydroxy 30 - 100 ng/mL 76    Latest Reference Range & Units 06/09/23 12:42  Prolactin ng/mL 26.2  Glucose 65 - 99 mg/dL 782 (H)  TSH 9.56 - 2.13 mIU/L 2.32  T4,Free(Direct) 0.8 - 1.8 ng/dL 1.1  (H): Data is abnormally high    DXA 08/29/2021   Results:   Lumbar spine L1-L4 Femoral neck (FN) 33% distal radius  T-score  -0.1 RFN: -1.4 LFN: -1.7 -1.4       MRI brain 08/25/2021  Brain: No acute infarct,  mass effect or extra-axial collection. No acute or chronic hemorrhage. Normal white matter signal. Generalized volume loss.   Pituitary/Sella: There is an unchanged hypoenhancing mass within the left aspect of the pituitary gland that measures 1.0 cm AP x 1.0 cm TV x 1.2 cm CC . These measurements are unchanged when the prior study is measured at the same level. The infundibulum is midline but shifted upward. There is no mass effect on the optic chiasm or optic nerves. Unchanged left cavernous sinus extension, grade 1 (medial to the median intercarotid line).   Vascular: Major flow voids are preserved.   Skull and upper cervical spine: Normal calvarium and skull base. Visualized upper cervical spine and soft tissues are normal.   Sinuses/Orbits:No paranasal sinus fluid levels or advanced mucosal thickening. No mastoid or middle ear effusion. Normal orbits.   IMPRESSION: Unchanged size and appearance of left-sided pituitary macroadenoma with grade 1 cavernous sinus extension. No mass effect on the optic chiasm or optic nerves.    ASSESSMENT / PLAN / RECOMMENDATIONS:   Pituitary macroadenoma:   -No local symptoms -Pituitary adenoma stable at ~  1.5 cm -MRI 08/2021 showed unchanged pituitary adenoma ( ordered by Dr. Maurice Small), per pt will f/u every 3 yrs with him  -Historically no biochemical evidence of hypo or hyperpituitarism -Emphasized the  importance of having annual visual field testing through ophthalmology -ACTH, cortisol, FSH, prolactin, TFTs, and IGF-1 level came back normal in the past  -One of the saliva cortisol came back abnormal, dexamethasone suppression test was normal in 2024 -Prolactin and TFTs are normal on today's labs -Will proceed with repeat MRI  2. Hyperparathyroidism:  -This is PTH mediated -She has been off HCTZ which has helped improve her serum calcium - DXA - low bone density 08/2021, but given right ankle fracture 06/2022 patient meets clinical diagnosis of osteoporosis - 24 hr urine calcium normal 119 mg  -Patient has not been following appropriate dietary intake of calcium, I have again emphasized the importance of consuming 2-3 servings of dietary calcium to prevent bone loss -Ionized calcium slightly elevated in the past -Serum calcium slightly elevated    Recommendations  Stay hydrated  Avoid OTC calcium  Consume 2-3 dietary calcium daily   3.Osteoporosis:  -This is a clinical diagnosis due to fragility fracture of the right ankle/2024 -I have again emphasized the importance of consuming 2-3 servings of dietary calcium daily -I have recommended bisphosphonate therapy, caution against GERD symptoms   Medication  Start alendronate 70 mg weekly    Follow-up in 6 months  Signed electronically by: Lyndle Herrlich, MD  [facility] Endocrinology  [facility] [address] [place] [place], [place] [address] Phone: [phone] FAX: [phone]      CC: Lewis Moccasin, MD [address] [place] [address] Phone: [phone]  Fax: [phone]   Return to Endocrinology clinic as below: Future Appointments  Date Time Provider Department Center  06/14/2023  3:15 PM Rosalee Kaufman, RPH-CPP [facility] None

## 2023-06-10 ENCOUNTER — Encounter: Payer: Self-pay | Admitting: Internal Medicine

## 2023-06-13 LAB — BASIC METABOLIC PANEL WITH GFR
BUN: 15 mg/dL (ref 7–25)
CO2: 30 mmol/L (ref 20–32)
Calcium: 10.6 mg/dL — ABNORMAL HIGH (ref 8.6–10.4)
Chloride: 102 mmol/L (ref 98–110)
Creat: 0.95 mg/dL (ref 0.60–1.00)
Glucose, Bld: 164 mg/dL — ABNORMAL HIGH (ref 65–99)
Potassium: 4.1 mmol/L (ref 3.5–5.3)
Sodium: 140 mmol/L (ref 135–146)
eGFR: 64 mL/min/{1.73_m2} (ref >=60)

## 2023-06-13 LAB — ACTH: C206 ACTH: 18 pg/mL (ref 6–50)

## 2023-06-13 LAB — ALDOSTERONE + RENIN ACTIVITY W/ RATIO
ALDO / PRA Ratio: 2.6 ratio (ref 0.9–28.9)
Renin Activity: 1.14 ng/mL/h (ref 0.25–5.82)
Renin Activity: 3 ng/mL/h (ref 0.25–5.82)

## 2023-06-13 LAB — T4, FREE: Free T4: 1.1 ng/dL (ref 0.8–1.8)

## 2023-06-13 LAB — VITAMIN D 25 HYDROXY (VIT D DEFICIENCY, FRACTURES): Vit D, 25-Hydroxy: 76 ng/mL (ref 30–100)

## 2023-06-13 LAB — TSH: TSH: 2.32 m[IU]/L (ref 0.40–4.50)

## 2023-06-13 LAB — PARATHYROID HORMONE, INTACT (NO CA): PTH: 63 pg/mL (ref 16–77)

## 2023-06-13 LAB — CALCIUM, IONIZED: Calcium, Ion: 5.8 mg/dL — ABNORMAL HIGH (ref 4.7–5.5)

## 2023-06-13 LAB — ALBUMIN: Albumin: 4.2 g/dL (ref 3.6–5.1)

## 2023-06-13 LAB — CORTISOL: Cortisol, Plasma: 15.5 ug/dL

## 2023-06-13 LAB — PROLACTIN: Prolactin: 26.2 ng/mL

## 2023-06-14 ENCOUNTER — Ambulatory Visit
Payer: Medicare HMO | Attending: Cardiovascular Disease | Admitting: Pharmacist Clinician (PhC)/ Clinical Pharmacy Specialist

## 2023-06-14 ENCOUNTER — Encounter: Payer: Self-pay | Admitting: Pharmacist Clinician (PhC)/ Clinical Pharmacy Specialist

## 2023-06-14 VITALS — BP 138/85 | HR 54

## 2023-06-14 DIAGNOSIS — I1A0 Resistant hypertension: Secondary | ICD-10-CM

## 2023-06-14 MED ORDER — POTASSIUM CHLORIDE ER 8 MEQ PO TBCR
8.0000 meq | EXTENDED_RELEASE_TABLET | Freq: Every day | ORAL | 3 refills | Status: AC
Start: 2023-06-14 — End: ?

## 2023-06-14 NOTE — Progress Notes (Signed)
 Office Visit    Patient Name: Elaine Burke Date of Encounter: 06/14/2023  Primary Care Provider:  Lewis Moccasin, MD Primary Cardiologist:  None  Chief Complaint    Hypertension - Advanced hypertension clinic  Past Medical History     Allergies  Allergen Reactions   Shellfish Allergy Swelling   Lisinopril Other (See Comments)    MD said affects kidneys. No take    Penicillins Hives, Itching and Swelling    ** Tolerated cefazolin Has patient had a PCN reaction causing immediate rash, facial/tongue/throat swelling, SOB or lightheadedness with hypotension: No Has patient had a PCN reaction causing severe rash involving mucus membranes or skin necrosis: No Has patient had a PCN reaction that required hospitalization: No Has patient had a PCN reaction occurring within the last 10 years: No If all of the above answers are "NO", then may proceed with Cephalosporin use.     History of Present Illness    Elaine Burke is a 73 y.o. female patient who was referred to the [facility]pert[facility] with a hx of hypertension, hyperlipidemia, diabetes, pituitary macroadenoma, hyperparathyroidism, and arthritis. She was seen by Dr. Duke Salvia on 12/30/22. Her blood pressure was 156/80 in office, but more controlled at home. It was noted that she may have white coat hypertension superimposed on essential hypertension. She is taking amlodipine, carvedilol, Bidil, and valsartan for hypertension control. During her visit, she was instructed to check her blood pressure twice daily and record her readings. During that time, she was also experiencing some lower extremity edema, noted prior to initiation of amlodipine. She was taking amlodipine 10 mg, but this was reduced to 5 mg daily. No symptoms of heart failure. ECHO taken on 01/01/23 showed EF of 60-65%. She had been wearing compression socks to help with her LE edema. She had surgery to treat a right ankle fracture in late April.  Exercise for her is limited with the inability to walk long distances.  I last saw her in November, at which time office BP was 134/85, but home readings averaged to WNL (129/79 in am, 126/80 in pm).  Today she returns for follow up.  She forgot her book of home readings, but notes that her BP machine usually had a green light when reporting results.  Had occasional yellow, but never red lights.  She thought the yellow light readings were in the 140-150's systolic.  Notes was 122/74 at most recent endocrinology appointment.          Diet: consists 50/50 of home cooked meals and take-out. She does eat vegetables daily. For snacks, she enjoys apples, grapes, and chips.    Exercise:  was doing PT in the winter to help with leg strengthening.  Still doing those exercises regularly, on her own.    Blood Pressure Goal:  130/80  Current Medications: amlodipine 5 mg every day, carvedilol 25 mg bid, valsartan 320 mg every day, Bidil 20/37.5 mg tid  Previously tried:  lisinopril - "affected kidneys"    Accessory Clinical Findings    Lab Results  Component Value Date   CREATININE 0.95 06/09/2023   BUN 15 06/09/2023   NA 140 06/09/2023   K 4.1 06/09/2023   CL 102 06/09/2023   CO2 30 06/09/2023   Lab Results  Component Value Date   ALT 50 (H) 05/08/2020   AST 38 05/08/2020   ALKPHOS 106 05/08/2020   BILITOT 0.3 05/08/2020   Lab Results  Component Value Date   HGBA1C  6.9 (H) 04/22/2020    Screening for Secondary Hypertension:      12/30/2022    2:08 PM  Causes  Drugs/Herbals Screened     - Comments limits salt but eats out.  Occasional tea. Coke Zero 3/week.  No EtOH.  +Aleve rarely  Renovascular HTN Screened  Sleep Apnea Screened     - Comments +snores.  apnea.  Thyroid Disease Screened  Hyperaldosteronism Screened     - Comments check renin and aldosterone  Pheochromocytoma Screened     - Comments check catecholamines and metanephrines  Cushing's Syndrome Screened     -  Comments ongoing screening per endocrine  Hyperparathyroidism Screened  Coarctation of the Aorta Screened     - Comments BP initially assymmetric but wnl on repeat  Compliance Screened    Relevant Labs/Studies:    Latest Ref Rng & Units 06/09/2023   12:42 PM 12/07/2022   12:25 PM 11/02/2022    7:22 PM  Basic Labs  Sodium 135 - 146 mmol/L 140   140   Potassium 3.5 - 5.3 mmol/L 4.1  3.7  3.4   Creatinine 0.60 - 1.00 mg/dL 0.45   4.09        Latest Ref Rng & Units 06/09/2023   12:42 PM 12/07/2022   12:25 PM  Thyroid   TSH 0.40 - 4.50 mIU/L 2.32  2.15         Latest Ref Rng & Units 06/09/2023   12:42 PM 01/04/2023    9:07 AM 12/07/2022   12:25 PM 08/29/2021   10:42 AM 07/10/2020   11:34 AM 11/08/2019   10:39 AM 07/19/2019    8:31 AM  Cortisol  Cortisol  mcg/dL 81.1  0.7  9.9  9.6  91.4  10.8  0.7        01/06/2023   11:21 AM  Renovascular   Renal Artery [place] Completed Yes      Home Medications    Current Outpatient Medications  Medication Sig Dispense Refill   alendronate (FOSAMAX) 70 MG tablet Take 1 tablet (70 mg total) by mouth every 7 (seven) days. Take with a full glass of water on an empty stomach. 12 tablet 3   amLODipine (NORVASC) 5 MG tablet Take 1 tablet (5 mg total) by mouth daily. 90 tablet 3   carvedilol (COREG) 25 MG tablet Take 25 mg by mouth 2 (two) times daily.     Cholecalciferol (VITAMIN D-3) 125 MCG (5000 UT) TABS Take 5,000 Units by mouth daily.     furosemide (LASIX) 20 MG tablet Take 20 mg by mouth daily.     isosorbide-hydrALAZINE (BIDIL) 20-37.5 MG tablet Take 0.5 tablets by mouth 3 (three) times daily. (Patient taking differently: Take 1 tablet by mouth 3 (three) times daily.) 45 tablet 11   potassium chloride (KLOR-CON) 8 MEQ tablet Take 1 tablet (8 mEq total) by mouth daily. 90 tablet 3   rosuvastatin (CRESTOR) 5 MG tablet Take 5 mg by mouth daily.     valsartan (DIOVAN) 320 MG tablet Take 1 tablet (320 mg total) by mouth daily. 90 tablet 1   No  current facility-administered medications for this visit.     Assessment & Plan       Resistant hypertension Assessment: BP is uncontrolled in office BP 132/85 mmHg;  above the goal (<130/80). She has been shown to have elevated readings in the office, with improved numbers at home Tolerates amlodipine, carvedilol, valsartan and bidil well - without any side effects Denies SOB,  palpitation, chest pain, headaches,or swelling Reiterated the importance of regular exercise and low salt diet  Has been unable to get refill of her KCl 10 mEq - pharmacy states out of stock.    Plan:  Continue taking amlodipine 5 mg every day, carvedilol 25 mg bid, valsartan 320 mg every day, Bidil 20/37.5 mg tid Potassium level stable, on daily furosemide and KCl 10 mEq.  Will try to get her a prescription for KCL 8 mEq daily and if that is not available, she could probably do 20 mEq MWF.   Patient to keep record of BP readings with heart rate and report to [place] at the next visit Patient to follow up with Dr. Duke Salvia in 6 months  Labs ordered today:  none   Phillips Hay PharmD CPP [facility]  [address] [place], [place] [address] [phone]

## 2023-06-14 NOTE — Patient Instructions (Signed)
 Follow up appointment: with Dr. Duke Salvia on Sept 22 at 3:45 pm  For alendronate:  take first thing in the morning with a full 8 oz of water.  Then stay upright (don't lie back down) for at least an hour.  The you can take other meds, eat, etc...    Take your BP meds as follows: Continue with amlodipine 5 mg every day, carvedilol 25 mg bid, valsartan 320 mg every day, Bidil 20/37.5 mg tid  Check your blood pressure at home daily (if able) and keep record of the readings.  Your blood pressure goal is < 130/80  To check your pressure at home you will need to:  1. Sit up in a chair, with feet flat on the floor and back supported. Do not cross your ankles or legs. 2. Rest your left arm so that the cuff is about heart level. If the cuff goes on your upper arm,  then just relax the arm on the table, arm of the chair or your lap. If you have a wrist cuff, we  suggest relaxing your wrist against your chest (think of it as Pledging the Flag with the  wrong arm).  3. Place the cuff snugly around your arm, about 1 inch above the crook of your elbow. The  cords should be inside the groove of your elbow.  4. Sit quietly, with the cuff in place, for about 5 minutes. After that 5 minutes press the power  button to start a reading. 5. Do not talk or move while the reading is taking place.  6. Record your readings on a sheet of paper. Although most cuffs have a memory, it is often  easier to see a pattern developing when the numbers are all in front of you.  7. You can repeat the reading after 1-3 minutes if it is recommended  Make sure your bladder is empty and you have not had caffeine or tobacco within the last 30 min  Always bring your blood pressure log with you to your appointments. If you have not brought your monitor in to be double checked for accuracy, please bring it to your next appointment.  You can find a list of quality blood pressure cuffs at [email or web address]  Important lifestyle changes  to control high blood pressure  Intervention  Effect on the BP  Lose extra pounds and watch your waistline Weight loss is one of the most effective lifestyle changes for controlling blood pressure. If you're overweight or obese, losing even a small amount of weight can help reduce blood pressure. Blood pressure might go down by about 1 millimeter of mercury (mm Hg) with each kilogram (about 2.2 pounds) of weight lost.  Exercise regularly As a general goal, aim for at least 30 minutes of moderate physical activity every day. Regular physical activity can lower high blood pressure by about 5 to 8 mm Hg.  Eat a healthy diet Eating a diet rich in whole grains, fruits, vegetables, and low-fat dairy products and low in saturated fat and cholesterol. A healthy diet can lower high blood pressure by up to 11 mm Hg.  Reduce salt (sodium) in your diet Even a small reduction of sodium in the diet can improve heart health and reduce high blood pressure by about 5 to 6 mm Hg.  Limit alcohol One drink equals 12 ounces of beer, 5 ounces of wine, or 1.5 ounces of 80-proof liquor.  Limiting alcohol to less than one drink a day for women or  two drinks a day for men can help lower blood pressure by about 4 mm Hg.   If you have any questions or concerns please use My Chart to send questions or call the office at [phone]

## 2023-06-14 NOTE — Assessment & Plan Note (Signed)
 Assessment: BP is uncontrolled in office BP 132/85 mmHg;  above the goal (<130/80). She has been shown to have elevated readings in the office, with improved numbers at home Tolerates amlodipine, carvedilol, valsartan and bidil well - without any side effects Denies SOB, palpitation, chest pain, headaches,or swelling Reiterated the importance of regular exercise and low salt diet  Has been unable to get refill of her KCl 10 mEq - pharmacy states out of stock.    Plan:  Continue taking amlodipine 5 mg every day, carvedilol 25 mg bid, valsartan 320 mg every day, Bidil 20/37.5 mg tid Potassium level stable, on daily furosemide and KCl 10 mEq.  Will try to get her a prescription for KCL 8 mEq daily and if that is not available, she could probably do 20 mEq MWF.   Patient to keep record of BP readings with heart rate and report to [place] at the next visit Patient to follow up with Dr. Duke Salvia in 6 months  Labs ordered today:  none

## 2023-07-05 ENCOUNTER — Encounter: Payer: Self-pay | Admitting: Internal Medicine

## 2023-07-09 ENCOUNTER — Other Ambulatory Visit

## 2023-07-19 NOTE — Telephone Encounter (Signed)
 Called patient they are going to do the sleep study they are in need of a Pre-cert as old one is expired

## 2023-07-19 NOTE — Telephone Encounter (Signed)
**Note De-Identified Chue Berkovich Obfuscation** Ordering provider: Dr Theodis Fiscal Associated diagnoses: Snoring-R06.83 and Somnolence-R40.0  WatchPAT PA obtained on 07/19/2023 by Dontea Corlew, Isabella Mao, LPN. Authorization: Per the Water quality scientist at [facility], a PA is not required for CPT Code: [id number].  Patient notified of PIN ([id number]) on 07/19/2023 Zakhari Fogel Notification Method: phone.  Phone note routed to covering staff for follow-up.

## 2023-07-21 ENCOUNTER — Ambulatory Visit
Admission: RE | Admit: 2023-07-21 | Discharge: 2023-07-21 | Disposition: A | Discharge status: Home / Self Care | Source: Ambulatory Visit | Attending: Internal Medicine | Admitting: Internal Medicine

## 2023-07-21 DIAGNOSIS — D352 Benign neoplasm of pituitary gland: Secondary | ICD-10-CM

## 2023-07-21 MED ORDER — GADOPICLENOL 0.5 MMOL/ML IV SOLN
9.0000 mL | Freq: Once | INTRAVENOUS | Status: AC | PRN
  Administered 2023-07-21: 9 mL via INTRAVENOUS

## 2023-08-09 NOTE — Telephone Encounter (Signed)
 Elaine Burke not completed or returned- routing to billing

## 2023-08-12 ENCOUNTER — Telehealth: Payer: Self-pay

## 2023-08-13 ENCOUNTER — Encounter (INDEPENDENT_AMBULATORY_CARE_PROVIDER_SITE_OTHER): Payer: Self-pay | Admitting: Cardiology

## 2023-08-13 DIAGNOSIS — G4733 Obstructive sleep apnea (adult) (pediatric): Secondary | ICD-10-CM | POA: Diagnosis not present

## 2023-08-15 NOTE — Procedures (Signed)
   SLEEP STUDY REPORT Patient Information Study Date: 08/13/2023 Patient Name: Elaine Burke Patient ID: [id number] Birth Date: 06-16-1950 Age: 73 Gender: Female BMI: 33.6 (W=189 lb, H=5' 3'') Referring Physician: Maudine Sos, MD  TEST DESCRIPTION: Home sleep apnea testing was completed using the WatchPat, a Type 1 device, utilizing peripheral arterial tonometry (PAT), chest movement, actigraphy, pulse oximetry, pulse rate, body position and snore. AHI was calculated with apnea and hypopnea using valid sleep time as the denominator. RDI includes apneas, hypopneas, and RERAs. The data acquired and the scoring of sleep and all associated events were performed in accordance with the recommended standards and specifications as outlined in the AASM Manual for the Scoring of Sleep and Associated Events 2.2.0 (2015).  FINDINGS:  1. Moderate Obstructive Sleep Apnea with AHI 24/hr overall and severe during REM sleep with REM AHI 43.4/hr.  2. No Central Sleep Apnea with pAHIc 0.5/hr.  3. Oxygen desaturations as low as 79%.  4. Mild snoring was present. O2 sats were < 88% for 7.5 min.  5. Total sleep time was 4 hrs and 17 min.  6. 25.7% of total sleep time was spent in REM sleep.  7. Normal sleep onset latency at 16 min  8. Prolonged REM sleep onset latency at 147 min.  9. Total awakenings were 5. 10. Arrhythmia detection: None  DIAGNOSIS: Moderate Obstructive Sleep Apnea (G47.33) Nocturnal Hypoxemia  RECOMMENDATIONS: 1. Clinical correlation of these findings is necessary. The decision to treat obstructive sleep apnea (OSA) is usually based on the presence of apnea symptoms or the presence of associated medical conditions such as Hypertension, Congestive Heart Failure, Atrial Fibrillation or Obesity. The most common symptoms of OSA are snoring, gasping for breath while sleeping, daytime sleepiness and fatigue. 2. Initiating apnea therapy is recommended given the presence of  symptoms and/or associated conditions. Recommend proceeding with one of the following:  a. Auto-CPAP therapy with a pressure range of 5-20cm H2O.  b. An oral appliance (OA) that can be obtained from certain dentists with expertise in sleep medicine. These are primarily of use in non-obese patients with mild and moderate disease.  c. An ENT consultation which may be useful to look for specific causes of obstruction and possible treatment options.  d. If patient is intolerant to PAP therapy, consider referral to ENT for evaluation for hypoglossal nerve stimulator. 3. Close follow-up is necessary to ensure success with CPAP or oral appliance therapy for maximum benefit . 4. A follow-up oximetry study on CPAP is recommended to assess the adequacy of therapy and determine the need for supplemental oxygen or the potential need for Bi-level therapy. An arterial blood gas to determine the adequacy of baseline ventilation and oxygenation should also be considered. 5. Healthy sleep recommendations include: adequate nightly sleep (normal 7-9 hrs/night), avoidance of caffeine after noon and alcohol  near bedtime, and maintaining a sleep environment that is cool, dark and quiet. 6. Weight loss for overweight patients is recommended. Even modest amounts of weight loss can significantly improve the severity of sleep apnea. 7. Snoring recommendations include: weight loss where appropriate, side sleeping, and avoidance of alcohol  before bed. 8. Operation of motor vehicle should not be performed when sleepy.  Signature: Gaylyn Keas, MD; [facility]; Diplomat, American Board of Sleep Medicine Electronically Signed: 08/15/2023 9:03:13 PM

## 2023-08-16 ENCOUNTER — Ambulatory Visit: Attending: Cardiovascular Disease

## 2023-08-16 DIAGNOSIS — R400 Somnolence: Secondary | ICD-10-CM

## 2023-08-16 DIAGNOSIS — I1A0 Resistant hypertension: Secondary | ICD-10-CM

## 2023-08-16 DIAGNOSIS — R0683 Snoring: Secondary | ICD-10-CM

## 2023-08-18 ENCOUNTER — Ambulatory Visit: Payer: Self-pay | Admitting: Cardiovascular Disease

## 2023-08-20 ENCOUNTER — Telehealth: Payer: Self-pay

## 2023-08-20 DIAGNOSIS — E785 Hyperlipidemia, unspecified: Secondary | ICD-10-CM

## 2023-08-20 DIAGNOSIS — I1A0 Resistant hypertension: Secondary | ICD-10-CM

## 2023-08-20 DIAGNOSIS — R0683 Snoring: Secondary | ICD-10-CM

## 2023-08-20 DIAGNOSIS — G4733 Obstructive sleep apnea (adult) (pediatric): Secondary | ICD-10-CM

## 2023-08-20 DIAGNOSIS — I10 Essential (primary) hypertension: Secondary | ICD-10-CM

## 2023-08-20 DIAGNOSIS — R400 Somnolence: Secondary | ICD-10-CM

## 2023-08-20 NOTE — Telephone Encounter (Signed)
-----   Message from Gaylyn Keas sent at 08/15/2023  9:04 PM EDT ----- Please let patient know that they have sleep apnea.  Recommend therapeutic CPAP titration for treatment of patient's sleep disordered breathing.

## 2023-08-20 NOTE — Telephone Encounter (Signed)
 Notified patient of sleep study results and recommendations. All questions were answered and patient verbalized understanding. CPAP Titration ordered today.

## 2023-08-25 ENCOUNTER — Ambulatory Visit: Payer: Self-pay | Admitting: Internal Medicine

## 2023-09-29 ENCOUNTER — Encounter: Attending: Family Medicine | Admitting: Dietician

## 2023-09-29 ENCOUNTER — Encounter: Payer: Self-pay | Admitting: Dietician

## 2023-09-29 VITALS — Ht 63.0 in | Wt 193.8 lb

## 2023-09-29 DIAGNOSIS — E119 Type 2 diabetes mellitus without complications: Secondary | ICD-10-CM | POA: Insufficient documentation

## 2023-09-29 NOTE — Progress Notes (Signed)
 Diabetes Self-Management Education  Visit Type: First/Initial  Appt. Start Time: 1405 Appt. End Time: 1505  09/29/2023  [name], identified by name and date of birth, is a 73 y.o. female with a diagnosis of Diabetes: Type 2.   ASSESSMENT  History includes: anemia, arthritis, type 2 diabetes, HLD, HTN Labs noted: 08/04/23: A1c 7.8%,  Medications include: reviewed Supplements: vitamin D   Pt reports her A1c went up. Pt states she was getting it tested twice a year and it was typically around 6.3, but states it went up to 7.8 in May. Pt reports she wants to ultimately work toward remission. Pt states she did the diabetes prevention program a few years ago and remembers reading food labels and tracking fat intake.   Pt reports she has been doing diet sodas. Pt states she also has not been adding salt to her food.   Pt reports she fell last April and broke her ankle and had surgery. Pt reports she has also been dealing with balance issues for about 5 years leading to her needing to walk with a walker.   Height 5' 3 (1.6 m), weight 193 lb 12.8 oz (87.9 kg). Body mass index is 34.33 kg/m.   Diabetes Self-Management Education - 09/29/23 1414       Visit Information   Visit Type First/Initial      Initial Visit   Diabetes Type Type 2    Date Diagnosed may 2025    Are you currently following a meal plan? No    Are you taking your medications as prescribed? Not on Medications      Health Coping   How would you rate your overall health? Fair      Psychosocial Assessment   Patient Belief/Attitude about Diabetes Motivated to manage diabetes    What is the hardest part about your diabetes right now, causing you the most concern, or is the most worrisome to you about your diabetes?   Making healty food and beverage choices    Self-care barriers None    Self-management support Doctor's office    Other persons present Patient    Patient Concerns Nutrition/Meal planning     Special Needs None    Preferred Learning Style No preference indicated    Learning Readiness Ready    How often do you need to have someone help you when you read instructions, pamphlets, or other written materials from your doctor or pharmacy? 1 - Never    What is the last grade level you completed in school? 16      Pre-Education Assessment   Patient understands the diabetes disease and treatment process. Needs Instruction    Patient understands incorporating nutritional management into lifestyle. Needs Instruction    Patient undertands incorporating physical activity into lifestyle. Needs Instruction    Patient understands using medications safely. Needs Instruction    Patient understands monitoring blood glucose, interpreting and using results Needs Instruction    Patient understands prevention, detection, and treatment of acute complications. Needs Instruction    Patient understands prevention, detection, and treatment of chronic complications. Needs Instruction    Patient understands how to develop strategies to address psychosocial issues. Needs Instruction    Patient understands how to develop strategies to promote health/change behavior. Needs Instruction      Complications   Last HgB A1C per patient/outside source 7.8 %    How often do you check your blood sugar? 0 times/day (not testing)    Have you had a dilated  eye exam in the past 12 months? Yes    Have you had a dental exam in the past 12 months? Yes    Are you checking your feet? No      Dietary Intake   Breakfast 11am: greek yogurt OR cereal (basic 4 or cinnamon toast crunch)    Snack (morning) none    Lunch none    Snack (afternoon) 5pm: peanut butter crackers OR chickfila salad and nuggets    Dinner 9pm: grilled chicken and rice and caesar salad    Snack (evening) none    Beverage(s) 24 oz water, half tea half lemonade, occasional coke zero      Activity / Exercise   Activity / Exercise Type ADL's      Patient  Education   Previous Diabetes Education No    Disease Pathophysiology Definition of diabetes, type 1 and 2, and the diagnosis of diabetes;Explored patient's options for treatment of their diabetes;Factors that contribute to the development of diabetes    Healthy Eating Role of diet in the treatment of diabetes and the relationship between the three main macronutrients and blood glucose level;Plate Method;Reviewed blood glucose goals for pre and post meals and how to evaluate the patients' food intake on their blood glucose level.;Meal options for control of blood glucose level and chronic complications.    Being Active Role of exercise on diabetes management, blood pressure control and cardiac health.;Helped patient identify appropriate exercises in relation to his/her diabetes, diabetes complications and other health issue.    Medications Reviewed patients medication for diabetes, action, purpose, timing of dose and side effects.    Monitoring Identified appropriate SMBG and/or A1C goals.    Acute complications Taught prevention, symptoms, and  treatment of hypoglycemia - the 15 rule.;Discussed and identified patients' prevention, symptoms, and treatment of hyperglycemia.    Chronic complications Relationship between chronic complications and blood glucose control;Identified and discussed with patient  current chronic complications    Diabetes Stress and Support Worked with patient to identify barriers to care and solutions;Identified and addressed patients feelings and concerns about diabetes;Role of stress on diabetes    Lifestyle and Health Coping Lifestyle issues that need to be addressed for better diabetes care      Individualized Goals (developed by patient)   Nutrition General guidelines for healthy choices and portions discussed    Physical Activity Exercise 3-5 times per week;15 minutes per day    Medications Not Applicable    Monitoring  Not Applicable    Problem Solving Eating Pattern     Reducing Risk examine blood glucose patterns;do foot checks daily;treat hypoglycemia with 15 grams of carbs if blood glucose less than 70mg /dL    Health Coping Ask for help with psychological, social, or emotional issues      Post-Education Assessment   Patient understands the diabetes disease and treatment process. Comprehends key points    Patient understands incorporating nutritional management into lifestyle. Comprehends key points    Patient undertands incorporating physical activity into lifestyle. Comprehends key points    Patient understands using medications safely. Comphrehends key points    Patient understands monitoring blood glucose, interpreting and using results Needs Review    Patient understands prevention, detection, and treatment of acute complications. Comprehends key points    Patient understands prevention, detection, and treatment of chronic complications. Comprehends key points    Patient understands how to develop strategies to address psychosocial issues. Comprehends key points    Patient understands how to develop strategies to  promote health/change behavior. Comprehends key points      Outcomes   Expected Outcomes Demonstrated interest in learning. Expect positive outcomes    Future DMSE 2 months    Program Status Not Completed          Individualized Plan for Diabetes Self-Management Training:   Learning Objective:  Patient will have a greater understanding of diabetes self-management. Patient education plan is to attend individual and/or group sessions per assessed needs and concerns.   Plan:   Patient Instructions  Goals Established by Patient:   Goal 1: drink at least 5 cups of water daily.   Goal 2: try 10 minute beginner chair workout on youtube 3 times a week.   Example:  [email or web address]  Goal 3: have a protein with breakfast.   Expected Outcomes:  Demonstrated interest in learning. Expect positive  outcomes  Education material provided: ADA - How to Thrive: A Guide for Your Journey with Diabetes, My Plate, and Snack sheet  If problems or questions, patient to contact team via:  Phone  Future DSME appointment: 2 months

## 2023-09-29 NOTE — Patient Instructions (Signed)
 Goals Established by Patient:   Goal 1: drink at least 5 cups of water daily.   Goal 2: try 10 minute beginner chair workout on youtube 3 times a week.   Example:  [email or web address]  Goal 3: have a protein with breakfast.

## 2023-10-06 ENCOUNTER — Telehealth: Payer: Self-pay | Admitting: Neurology

## 2023-10-06 NOTE — Telephone Encounter (Signed)
 FYI only:  I reviewed patient's ophthalmology visit note from 07/07/2023, she saw Dr. Charlie Gaudy.  Impression/plan: <<Pseudophakia OU, ERM OS.  At this time the ERM is stable and not causing significant visual disturbance.  Will observe for now.  Pituitary adenoma.  Feels a stable/reassuring.  Resolved rebound iritis.>>  I will have the full report scanned in.

## 2023-11-29 ENCOUNTER — Telehealth (HOSPITAL_BASED_OUTPATIENT_CLINIC_OR_DEPARTMENT_OTHER): Payer: Self-pay | Admitting: *Deleted

## 2023-11-29 ENCOUNTER — Encounter (HOSPITAL_BASED_OUTPATIENT_CLINIC_OR_DEPARTMENT_OTHER): Payer: Self-pay | Admitting: Cardiovascular Disease

## 2023-11-29 ENCOUNTER — Ambulatory Visit (HOSPITAL_BASED_OUTPATIENT_CLINIC_OR_DEPARTMENT_OTHER): Admitting: Cardiovascular Disease

## 2023-11-29 VITALS — BP 132/74 | HR 54 | Resp 98 | Ht 63.0 in | Wt 196.2 lb

## 2023-11-29 DIAGNOSIS — Z5181 Encounter for therapeutic drug level monitoring: Secondary | ICD-10-CM

## 2023-11-29 DIAGNOSIS — I1A0 Resistant hypertension: Secondary | ICD-10-CM

## 2023-11-29 DIAGNOSIS — E7800 Pure hypercholesterolemia, unspecified: Secondary | ICD-10-CM | POA: Diagnosis not present

## 2023-11-29 MED ORDER — ISOSORB DINITRATE-HYDRALAZINE 20-37.5 MG PO TABS: 1.0000 | ORAL_TABLET | Freq: Three times a day (TID) | ORAL | 1 refills | Status: AC

## 2023-11-29 MED ORDER — FUROSEMIDE 40 MG PO TABS: 40.0000 mg | ORAL_TABLET | Freq: Every day | ORAL | 3 refills | Status: DC

## 2023-11-29 NOTE — Progress Notes (Signed)
 [facility] Initial Assessment:    Date:  11/29/2023   ID:  AALAIYAH Elaine Burke, DOB [birth year], MRN [id number]  PCP:  Waylan Almarie SAUNDERS, MD  Cardiologist:  None  Nephrologist:  Referring MD: Waylan Almarie SAUNDERS, MD   CC: Hypertension  History of Present Illness:    Elaine Burke is a 73 y.o. female with a hx of hypertension, hyperlipidemia, diabetes, pituitary macroadenoma, hyperparathyroidism, arthritis, here for follow up.   She presented to the ED 11/02/2022 with complaints of occasional blood pressures in the 200s systolic on a regimen of Bidil , Coreg , valsartan , and Lasix . She normally took amlodipine  but this had been held due to LE edema which was being treated with Lasix  and compression stockings. She was asymptomatic. Coreg  was administered with subsequent improvement in her blood pressures. EKG was unremarkable. Discharged with recommendations to restart amlodipine  if her blood pressure remained elevated. Subsequently her amlodipine  was increased to 10 mg due to elevated blood pressures.  On 12/07/2022 she saw her endocrinologist Dr. Sam. Cortisol levels were normal. Renin/aldosterone labs were ordered but canceled due to lack of specimens.  She was initially seen in our practice by Dr. Elmira 12/14/2022. She is a retired English as a second language teacher, moved from [place]  to [place] about 5 years ago. Her blood pressure was 122/62 in the office. She had LE edema that was managed with compression stockings, and partly attributable to amlodipine  10 mg. Her amlodipine  was reduced to 5 mg daily, recommended for close BP follow-up with our pharmacist.   Echocardiogram and renal artery dopplers are currently scheduled for 01/01/23 and 01/06/23,   Today, she is accompanied by her husband. Generally she has been feeling okay. In the office her blood pressure is 145/84 initially, and 158/84 (right arm), 156/80 (left arm) on manual recheck. She presents a  blood pressure log which is personally reviewed, with average readings in the 120s/80s, some lows to the 90s systolic, rarely in the 130s systolic. For the past 3 weeks she has been participating in OT and PT. She had surgery to treat a right ankle fracture in late April. She confirms having balance issues and uses a walker for assistance. She has some LE edema, R>L. Her swelling had started before she started her amlodipine . Usually she wears compression socks. She is sitting for most of the day. She does get up and try to walk every hour as recommended by her physical therapist. She both prepares meals at home and orders out. When cooking at home she rarely uses salt, and never adds extra salt at the table. May drink tea occasionally, and coke zero three times a week. No alcohol  consumption. For pain management, she seldom takes Aleve  usually for knee pain. She confirms that she snores, and her husband has noticed occasional apneic episodes. Usually she feels well rested in the mornings. She denies any palpitations, chest pain, shortness of breath, lightheadedness, headaches, syncope.  Discussed the use of AI scribe software for clinical note transcription with the patient, who gave verbal consent to proceed.  History of Present Illness [name]'s blood pressure has been stable at home, averaging around 135/80 mmHg. She is currently on amlodipine , carvedilol , valsartan , and Bidil , taking the full tablet of Bidil  three times a day. Additionally, she takes 20 mg of furosemide  daily for fluid management.  She is undergoing physical therapy for her legs and walking problems, feeling fine during these sessions without experiencing chest pain or breathing difficulties. She wears compression socks due to  stable swelling around her ankles. She is trying to maintain a good diet and is mindful of her salt intake.  She reports that she was told she stops breathing sometimes during sleep and that she has not  been contacted for further evaluation or treatment after a sleep study in June. She was informed that she stops breathing sometimes during sleep but has not been contacted for further evaluation or treatment.  No chest pain or breathing difficulties. Stable swelling around her ankles.  Previous antihypertensives: N/a  Past Medical History:  Diagnosis Date   Anemia    Arthritis    Arthritis    Diabetes mellitus without complication (HCC)    HLD (hyperlipidemia)    Hypertension    Pituitary macroadenoma (HCC)    Pure hypercholesterolemia 11/29/2023   Snoring 12/30/2022    Past Surgical History:  Procedure Laterality Date   ABDOMINAL HYSTERECTOMY     CESAREAN SECTION     COLONOSCOPY     EYE SURGERY     bilateral cataracts   ORIF ANKLE FRACTURE Right 07/02/2022   Procedure: OPEN TREATMENT OF RIGHT TRIMALLEOLAR ANKLE FRACTURE;  Surgeon: Elsa Lonni SAUNDERS, MD;  Location[facility];  Service: Orthopedics;  Laterality: Right;  LENGTH OF SURGERY: 150 MINUTES   SYNDESMOSIS REPAIR Right 07/02/2022   Procedure: POSSIBLE OPEN TREATMENT OF SYNDESMOSIS;  Surgeon: Elsa Lonni SAUNDERS, MD;  Location: [facility] OR;  Service: Orthopedics;  Laterality: Right;    Current Medications: Current Meds  Medication Sig   alendronate  (FOSAMAX ) 70 MG tablet Take 1 tablet (70 mg total) by mouth every 7 (seven) days. Take with a full glass of water on an empty stomach.   amLODipine  (NORVASC ) 5 MG tablet Take 1 tablet (5 mg total) by mouth daily.   carvedilol  (COREG ) 25 MG tablet Take 25 mg by mouth 2 (two) times daily.   Cholecalciferol  (VITAMIN D -3) 125 MCG (5000 UT) TABS Take 5,000 Units by mouth daily.   potassium chloride  (KLOR-CON ) 8 MEQ tablet Take 1 tablet (8 mEq total) by mouth daily.   rosuvastatin  (CRESTOR ) 5 MG tablet Take 5 mg by mouth daily.   valsartan  (DIOVAN ) 320 MG tablet Take 1 tablet (320 mg total) by mouth daily.   [DISCONTINUED] furosemide  (LASIX ) 20 MG tablet Take 20 mg by mouth daily.    [DISCONTINUED] isosorbide -hydrALAZINE  (BIDIL ) 20-37.5 MG tablet Take 0.5 tablets by mouth 3 (three) times daily. (Patient taking differently: Take 1 tablet by mouth 3 (three) times daily.)     Allergies:   Shellfish allergy, Lisinopril, and Penicillins   Social History   Socioeconomic History   Marital status: Married    Spouse name: Not on file   Number of children: 1   Years of education: Not on file   Highest education level: Not on file  Occupational History   Not on file  Tobacco Use   Smoking status: Never   Smokeless tobacco: Never  Vaping Use   Vaping status: Never Used  Substance and Sexual Activity   Alcohol  use: No   Drug use: No   Sexual activity: Not on file  Other Topics Concern   Not on file  Social History Narrative   Not on file   Social Drivers of Health   Financial Resource Strain: Not on file  Food Insecurity: No Food Insecurity (09/29/2023)   Hunger Vital Sign    Worried About Running Out of Food in the Last Year: Never true    Ran Out of Food in the Last Year:  Never true  Transportation Needs: No Transportation Needs (07/02/2022)   PRAPARE - Administrator, Civil Service (Medical): No    Lack of Transportation (Non-Medical): No  Physical Activity: Not on file  Stress: Not on file  Social Connections: Not on file     Family History: The patient's family history includes Breast cancer in her paternal aunt; Dementia in her mother; Heart attack (age of onset: 37) in her mother; Hypertension in her brother, mother, and sister; Other in her father; Stroke in her paternal grandfather.  ROS:   Please see the history of present illness.    (+) LE edema, R>L (+) Snoring (+) PND All other systems reviewed and are negative.  EKGs/Labs/Other Studies Reviewed:    EKG:  EKG is personally reviewed. 12/30/2022: Not ordered.   EKG Interpretation Date/Time:  Monday November 29 2023 16:11:33 EDT Ventricular Rate:  51 PR Interval:  162 QRS  Duration:  80 QT Interval:  394 QTC Calculation: 363 R Axis:   -20  Text Interpretation: Sinus bradycardia Low voltage QRS Since last tracing Rate slower Confirmed by Raford Riggs ([id number]) on 11/29/2023 6:30:42 PM          Recent Labs: 06/09/2023: BUN 15; Creat 0.95; Potassium 4.1; Sodium 140; TSH 2.32   Recent Lipid Panel No results found for: CHOL, TRIG, HDL, CHOLHDL, VLDL, LDLCALC, LDLDIRECT  Echo 12/2022: IMPRESSIONS     1. Left ventricular ejection fraction, by estimation, is 60 to 65%. The  left ventricle has normal function. The left ventricle has no regional  wall motion abnormalities. Left ventricular diastolic parameters are  consistent with Grade I diastolic  dysfunction (impaired relaxation).   2. Right ventricular systolic function is normal. The right ventricular  size is normal. Tricuspid regurgitation signal is inadequate for assessing  PA pressure.   3. The mitral valve is normal in structure. Trivial mitral valve  regurgitation. No evidence of mitral stenosis.   4. The aortic valve is tricuspid. Aortic valve regurgitation is not  visualized. No aortic stenosis is present.   5. The inferior vena cava is normal in size with greater than 50%  respiratory variability, suggesting right atrial pressure of 3 mmHg.   Physical Exam:    VS:  BP 132/74   Pulse (!) 54   Resp (!) 98   Ht 5' 3 (1.6 m)   Wt 196 lb 3.2 oz (89 kg)   HC 16 (40.6 cm)   BMI 34.76 kg/m  , BMI Body mass index is 34.76 kg/m. GENERAL:  Well appearing; ambulates independently in wheelchair. HEENT: Pupils equal round and reactive, fundi not visualized, oral mucosa unremarkable NECK:  No jugular venous distention, waveform within normal limits, carotid upstroke brisk and symmetric, no bruits, no thyromegaly LUNGS:  Clear to auscultation bilaterally HEART:  RRR.  PMI not displaced or sustained, S1 and S2 within normal limits, no S3, no S4, no clicks, no rubs, no  murmurs ABD:  Flat, positive bowel sounds normal in frequency in pitch, no bruits, no rebound, no guarding, no midline pulsatile mass, no hepatomegaly, no splenomegaly EXT:  2 plus pulses throughout, 1+ LE edema to mid tibia bilaterally, no cyanosis, no clubbing SKIN:  No rashes, no nodules NEURO:  Cranial nerves II through XII grossly intact, motor grossly intact throughout PSYCH:  Cognitively intact, oriented to person place and time   ASSESSMENT/PLAN:    Assessment & Plan # Resistant hypertension with lower extremity edema Persistent edema noted. Increasing amlodipine  not advised due  to edema risk. - Increase furosemide  to 40 mg daily. - Continue amlodipine , carvedilol , valsartan  and Bidl - Order basic metabolic panel in one week to monitor potassium levels. - Continue current potassium supplementation. - Update medication list to reflect correct dosing of Bidil .  # Moderate obstructive sleep apnea Further evaluation and management necessary. - Follow up on scheduling the sleep study for further evaluation and management.  # Hypercholesterolemia;  Continue rosuvastatin .   Screening for Secondary Hypertension:     12/30/2022    2:08 PM  Causes  Drugs/Herbals Screened     - Comments limits salt but eats out.  Occasional tea. Coke Zero 3/week.  No EtOH.  +Aleve  rarely  Renovascular HTN Screened  Sleep Apnea Screened     - Comments +snores.  apnea.  Thyroid  Disease Screened  Hyperaldosteronism Screened     - Comments check renin and aldosterone  Pheochromocytoma Screened     - Comments check catecholamines and metanephrines  Cushing's Syndrome Screened     - Comments ongoing screening per endocrine  Hyperparathyroidism Screened  Coarctation of the Aorta Screened     - Comments BP initially assymmetric but wnl on repeat  Compliance Screened    Relevant Labs/Studies:    Latest Ref Rng & Units 06/09/2023   12:42 PM 12/07/2022   12:25 PM 11/02/2022    7:22 PM  Basic Labs   Sodium 135 - 146 mmol/L 140   140   Potassium 3.5 - 5.3 mmol/L 4.1  3.7  3.4   Creatinine 0.60 - 1.00 mg/dL 9.04   9.11        Latest Ref Rng & Units 06/09/2023   12:42 PM 12/07/2022   12:25 PM  Thyroid    TSH 0.40 - 4.50 mIU/L 2.32  2.15             Latest Ref Rng & Units 06/09/2023   12:42 PM 01/04/2023    9:07 AM 12/07/2022   12:25 PM 08/29/2021   10:42 AM 07/10/2020   11:34 AM 11/08/2019   10:39 AM 07/19/2019    8:31 AM  Cortisol  Cortisol  mcg/dL 84.4  0.7  9.9  9.6  89.5  10.8  0.7        01/06/2023   11:21 AM  Renovascular   Renal Artery US  Completed Yes    Disposition:    FU with Biana Haggar C. Raford, MD, [facility] in 3 months.  Medication Adjustments/Labs and Tests Ordered: Current medicines are reviewed at length with the patient today.  Concerns regarding medicines are outlined above.   Orders Placed This Encounter  Procedures   Basic metabolic panel with GFR   EKG [id number]   Meds ordered this encounter  Medications   furosemide  (LASIX ) 40 MG tablet    Sig: Take 1 tablet (40 mg total) by mouth daily.    Dispense:  90 tablet    Refill:  3    NEW DOSE, D/C 20 MG RX   isosorbide -hydrALAZINE  (BIDIL ) 20-37.5 MG tablet    Sig: Take 1 tablet by mouth 3 (three) times daily.    Dispense:  270 tablet    Refill:  1    TO PUT ON FILE, DOES NOT NEED AT THIS TIME     Signed, Annabella Raford, MD  11/29/2023 6:31 PM    [facility]

## 2023-11-29 NOTE — Patient Instructions (Addendum)
 Medication Instructions:  INCREASE FUROSEMIDE  TO 40 MG DAILY   Labwork: BMET IN 1 WEEK   Testing/Procedures: NONE  Follow-Up: 3 MONTHS WITH CAITLIN W NP OR DR Luling   HAVE SENT A MESSAGE TO THE SLEEP TEAM TO REACH OUT TO YOU, IF YOU DO NOT HEAR FROM THEM IN 2 WEEKS CALL OR SEND MYCHART MESSAGE   If you need a refill on your cardiac medications before your next appointment, please call your pharmacy.

## 2023-11-29 NOTE — Telephone Encounter (Signed)
 Patient in for visit with Dr Raford today.  Needed sleep titrations, see 08/20/2023 telephone note She has not heard for anyone   Will forward to sleep team to follow up

## 2023-11-30 NOTE — Telephone Encounter (Signed)
**Note De-Identified Charlene Detter Obfuscation** I started a CPAP Titration PA through the [facility] provider portal and it is currently pending review. Case [id number]

## 2023-12-01 ENCOUNTER — Ambulatory Visit: Admitting: Dietician

## 2023-12-02 LAB — BASIC METABOLIC PANEL WITH GFR
BUN/Creatinine Ratio: 16 (ref 12–28)
BUN: 14 mg/dL (ref 8–27)
CO2: 23 mmol/L (ref 20–29)
Calcium: 10.5 mg/dL — ABNORMAL HIGH (ref 8.7–10.3)
Chloride: 105 mmol/L (ref 96–106)
Creatinine, Ser: 0.89 mg/dL (ref 0.57–1.00)
Glucose: 103 mg/dL — ABNORMAL HIGH (ref 70–99)
Potassium: 4.3 mmol/L (ref 3.5–5.2)
Sodium: 140 mmol/L (ref 134–144)
eGFR: 69 mL/min/1.73 (ref >59)

## 2023-12-03 ENCOUNTER — Ambulatory Visit: Payer: Self-pay | Admitting: Cardiovascular Disease

## 2023-12-07 ENCOUNTER — Telehealth: Payer: Self-pay

## 2023-12-07 NOTE — Telephone Encounter (Signed)
 Sure

## 2023-12-07 NOTE — Telephone Encounter (Signed)
 Dr. Waylan would like to know if you can add a A1c on patient visit for this Friday.

## 2023-12-08 ENCOUNTER — Encounter (HOSPITAL_BASED_OUTPATIENT_CLINIC_OR_DEPARTMENT_OTHER): Payer: Self-pay | Admitting: Cardiovascular Disease

## 2023-12-08 DIAGNOSIS — Z5181 Encounter for therapeutic drug level monitoring: Secondary | ICD-10-CM

## 2023-12-08 DIAGNOSIS — I10 Essential (primary) hypertension: Secondary | ICD-10-CM

## 2023-12-10 ENCOUNTER — Ambulatory Visit (INDEPENDENT_AMBULATORY_CARE_PROVIDER_SITE_OTHER): Admitting: Internal Medicine

## 2023-12-10 ENCOUNTER — Encounter: Payer: Self-pay | Admitting: Internal Medicine

## 2023-12-10 VITALS — BP 124/80 | HR 61 | Ht 63.0 in | Wt 194.6 lb

## 2023-12-10 DIAGNOSIS — D352 Benign neoplasm of pituitary gland: Secondary | ICD-10-CM | POA: Diagnosis not present

## 2023-12-10 DIAGNOSIS — E213 Hyperparathyroidism, unspecified: Secondary | ICD-10-CM | POA: Diagnosis not present

## 2023-12-10 DIAGNOSIS — M810 Age-related osteoporosis without current pathological fracture: Secondary | ICD-10-CM

## 2023-12-10 DIAGNOSIS — E119 Type 2 diabetes mellitus without complications: Secondary | ICD-10-CM

## 2023-12-10 MED ORDER — ALENDRONATE SODIUM 70 MG PO TABS
70.0000 mg | ORAL_TABLET | ORAL | 3 refills | Status: AC
Start: 2023-12-10 — End: ?

## 2023-12-10 MED ORDER — DEXAMETHASONE 1 MG PO TABS: 1.0000 mg | ORAL_TABLET | Freq: Once | ORAL | 0 refills | Status: AC

## 2023-12-10 NOTE — Patient Instructions (Signed)
   Instructions for Dexamethasone Suppression Test   Step 1: Choose a morning when you can come to our lab at 8:00 am for a blood draw.   Step 2: On the night before the blood draw, take one 1 mg tablet of dexamethasone at 11:30 pm.  The timing is VERY important!   Step 3: The next morning, go to the lab for blood work at 8:00 am.  Bonita Quin do not have to be on an empty stomach, but the timing is VERY important!

## 2023-12-10 NOTE — Progress Notes (Signed)
 Name: RIVKAH WOLZ  MRN/ DOB: [id number], March 12, 1950    Age/ Sex: 73 y.o., female     PCP: Waylan Almarie SAUNDERS, MD   Reason for Endocrinology Evaluation: Pituitary adenoma /hypercalcemia      Initial Endocrinology Clinic Visit: 07/18/2019    PATIENT IDENTIFIER: Ms. ALLICE GARRO is a 73 y.o., female with a past medical history of pituitary macroadenoma. She has followed with [facility] since 07/18/2019 for consultative assistance with management of her pituitary adenoma .   HISTORICAL SUMMARY: The patient was first diagnosed with 1.5 cm pituitary macroadenoma in 2018 during evaluation for unsteady gait.  Historically hormonal levels and pituitary imaging has been stable  No prior exposure to XRT   HYPERPARATHYROIDISM HISTORY: Patient has been noted with hypercalcemia since 2018, with a max level of 11.5 mg/dL (uncorrected) on 87/85/7977, with inappropriately normal PTH with a max level of 61 PG/mL 02/19/2021  Serum calcium  improved with discontinuation of HCTZ  DXA - low bone  density 08/2021 24 hr urine calcium  119 (08/2021)   SUBJECTIVE:     Today (12/10/2023):  Ms. Esham is here for a follow-up on pituitary macroadenoma.  Patient has been following up with the hypertension clinic for resistant hypertension  She has been drinking 5 glasses of water daily  Last eye exam 07/07/2023 No headaches No vision changes  NO palpitations  Weight stable  NO heartburn No constipation or diarrhea  No tremors    She consumes 2 servings of dietary calcium  daily  She is on Vitamin D  5000 iu daily         HISTORY:  Past Medical History:  Past Medical History:  Diagnosis Date   Anemia    Arthritis    Arthritis    Diabetes mellitus without complication (HCC)    HLD (hyperlipidemia)    Hypertension    Pituitary macroadenoma (HCC)    Pure hypercholesterolemia 11/29/2023   Snoring 12/30/2022   Past Surgical History:  Past Surgical  History:  Procedure Laterality Date   ABDOMINAL HYSTERECTOMY     CESAREAN SECTION     COLONOSCOPY     EYE SURGERY     bilateral cataracts   ORIF ANKLE FRACTURE Right 07/02/2022   Procedure: OPEN TREATMENT OF RIGHT TRIMALLEOLAR ANKLE FRACTURE;  Surgeon: Elsa Lonni SAUNDERS, MD;  Location: [facility] OR;  Service: Orthopedics;  Laterality: Right;  LENGTH OF SURGERY: 150 MINUTES   SYNDESMOSIS REPAIR Right 07/02/2022   Procedure: POSSIBLE OPEN TREATMENT OF SYNDESMOSIS;  Surgeon: Elsa Lonni SAUNDERS, MD;  Location: [facility] OR;  Service: Orthopedics;  Laterality: Right;   Social History:  reports that she has never smoked. She has never used smokeless tobacco. She reports that she does not drink alcohol  and does not use drugs. Family History:  Family History  Problem Relation Age of Onset   Hypertension Mother    Heart attack Mother 17   Dementia Mother    Other Father    Hypertension Sister    Hypertension Brother    Breast cancer Paternal Aunt    Stroke Paternal Grandfather      HOME MEDICATIONS: Allergies as of 12/10/2023       Reactions   Shellfish Allergy Swelling   Lisinopril Other (See Comments)   MD said affects kidneys. No take    Penicillins Hives, Itching, Swelling   ** Tolerated cefazolin  Has patient had a PCN reaction causing immediate rash, facial/tongue/throat swelling, SOB or lightheadedness with hypotension: No Has patient had a PCN reaction causing  severe rash involving mucus membranes or skin necrosis: No Has patient had a PCN reaction that required hospitalization: No Has patient had a PCN reaction occurring within the last 10 years: No If all of the above answers are NO, then may proceed with Cephalosporin use.        Medication List        Accurate as of December 10, 2023  7:33 AM. If you have any questions, ask your nurse or doctor.          alendronate  70 MG tablet Commonly known as: FOSAMAX  Take 1 tablet (70 mg total) by mouth every 7 (seven) days. Take  with a full glass of water on an empty stomach.   amLODipine  5 MG tablet Commonly known as: NORVASC  Take 1 tablet (5 mg total) by mouth daily.   carvedilol  25 MG tablet Commonly known as: COREG  Take 25 mg by mouth 2 (two) times daily.   furosemide  40 MG tablet Commonly known as: LASIX  Take 1 tablet (40 mg total) by mouth daily.   isosorbide -hydrALAZINE  20-37.5 MG tablet Commonly known as: BIDIL  Take 1 tablet by mouth 3 (three) times daily.   potassium chloride  8 MEQ tablet Commonly known as: KLOR-CON  Take 1 tablet (8 mEq total) by mouth daily.   rosuvastatin  5 MG tablet Commonly known as: CRESTOR  Take 5 mg by mouth daily.   valsartan  320 MG tablet Commonly known as: DIOVAN  Take 1 tablet (320 mg total) by mouth daily.   Vitamin D -3 125 MCG (5000 UT) Tabs Take 5,000 Units by mouth daily.          OBJECTIVE:   PHYSICAL EXAM: VS: There were no vitals taken for this visit.   EXAM: General: Pt appears well and is in NAD  Neck: General: Supple without adenopathy. Thyroid : Thyroid  size normal.  No goiter or nodules appreciated.   Lungs: Clear with good BS bilat   Heart: Auscultation: RRR.  Abdomen: Soft, nontender  Extremities:  BL LE: trace  pretibial edema   Mental Status: Judgment, insight: Intact Orientation: Oriented to time, place, and person Mood and affect: No depression, anxiety, or agitation     DATA REVIEWED:   Latest Reference Range & Units 12/10/23 12:40  eAG (mmol/L) mmol/L 8.2  Hemoglobin A1C <5.7 % 6.8 (H)  (H): Data is abnormally high    Latest Reference Range & Units 12/01/23 14:24 12/10/23 12:40  Sodium 134 - 144 mmol/L 140   Potassium 3.5 - 5.2 mmol/L 4.3   Chloride 96 - 106 mmol/L 105   CO2 20 - 29 mmol/L 23   Glucose 70 - 99 mg/dL 896 (H)   Mean Plasma Glucose mg/dL  851  BUN 8 - 27 mg/dL 14   Creatinine 9.42 - 1.00 mg/dL 9.10   Calcium  8.7 - 10.3 mg/dL 89.4 (H)   BUN/Creatinine Ratio 12 - 28  16   eGFR >59 mL/min/1.73 69    (H): Data is abnormally high    Latest Reference Range & Units 06/09/23 12:42  Sodium 135 - 146 mmol/L 140  Potassium 3.5 - 5.3 mmol/L 4.1  Chloride 98 - 110 mmol/L 102  CO2 20 - 32 mmol/L 30  Glucose 65 - 99 mg/dL 835 (H)  BUN 7 - 25 mg/dL 15  Creatinine 9.39 - 8.99 mg/dL 9.04  Calcium  8.6 - 10.4 mg/dL 89.3 (H)  BUN/Creatinine Ratio 6 - 22 (calc) SEE NOTE:  eGFR > OR = 60 mL/min/1.31m2 64    Latest Reference Range & Units 06/09/23  12:42  Vitamin D , 25-Hydroxy 30 - 100 ng/mL 76    Latest Reference Range & Units 06/09/23 12:42  Prolactin ng/mL 26.2  Glucose 65 - 99 mg/dL 835 (H)  TSH 9.59 - 5.49 mIU/L 2.32  T4,Free(Direct) 0.8 - 1.8 ng/dL 1.1    DXA 3/76/7976   Results:   Lumbar spine L1-L4 Femoral neck (FN) 33% distal radius  T-score  -0.1 RFN: -1.4 LFN: -1.7 -1.4       MRI brain 07/21/2023  COMPARISON:  Brain MRI 08/25/2021.   FINDINGS: Brain: Dedicated pituitary details are below.   No superimposed restricted diffusion to suggest acute infarction. No midline shift, mass effect, ventriculomegaly, extra-axial collection or acute intracranial hemorrhage. Cervicomedullary junction is within normal limits.   Cerebral volume is not significantly changed since 2023. Elnor and white matter signal is stable and within normal limits for age. No abnormal gray or white matter enhancement identified.   Vascular: Major intracranial vascular flow voids are stable. Following contrast the major dural venous sinuses appear to be enhancing and patent.   Skull and upper cervical spine: Normal background bone marrow signal. Skull base remains intact. Negative for age visible cervical spine.   Sinuses/Orbits: Stable and negative.   Other: Chronic rounded, heterogeneously hyperenhancing intra sellar mass with suprasellar extension does not appear significantly changed in size or configuration since 2023. The mass is approximately 13 x 15 x 18 mm (AP by transverse by CC) -  stable when measured with the same technique, and with asymmetric protrusion into the left cavernous sinus as before (series 21, image 8). Stable effaced optic chiasm (series 13, image 8). No progressive regional mass effect. No new complicating features identified.   IMPRESSION: 1. Stable Pituitary Macroadenoma since 2023. Effaced undersurface of the optic chiasm and mild asymmetric involvement of the left cavernous sinus as before. 2. No new intracranial abnormality.     ASSESSMENT / PLAN / RECOMMENDATIONS:   Pituitary macroadenoma:   -No local symptoms -Pituitary adenoma stable at ~  1.5 cm -MRI 08/2021 showed unchanged pituitary adenoma ( ordered by Dr. Cheryle).  Repeat pituitary MRI showed stability in 2025 -Historically no biochemical evidence of hypo or hyperpituitarism -Emphasized the importance of having annual visual field testing through ophthalmology -ACTH , cortisol, FSH, prolactin, TFTs, and IGF-1 level came back normal in the past  -One of the saliva cortisol came back abnormal, dexamethasone  suppression test was normal in 2024  - Will proceed with pituitary hormonal checkup as well as dexamethasone  suppression test   2. Hyperparathyroidism:  -This is PTH mediated -She has been off HCTZ which has helped improve her serum calcium  - DXA - low bone density 08/2021, but given right ankle fracture 06/2022 patient meets clinical diagnosis of osteoporosis - 24 hr urine calcium  normal 119 mg  -Patient has not been following appropriate dietary intake of calcium , I have again emphasized the importance of consuming 2-3 servings of dietary calcium  to prevent bone loss - PTH, serum calcium , vitamin D , albumin pending  Recommendations  Stay hydrated  Avoid OTC calcium   Consume 2-3 dietary calcium  daily   3.Osteoporosis:  -This is a clinical diagnosis due to fragility fracture of the right ankle/2024 -I have again emphasized the importance of consuming 2-3 servings of  dietary calcium  daily - Patient is  scheduled for DXA scan on 11//2025  Medication  Alendronate  70 mg weekly  Follow-up in 6 months  Signed electronically by: Stefano Redgie Butts, MD  [facility]  [facility] [address]  [place] [address] [place] [address] Phone: [phone] FAX: [phone]      CC: Waylan Almarie SAUNDERS, MD [address] [place] [address] Phone: [phone]  Fax: [phone]   Return to Endocrinology clinic as below: Future Appointments  Date Time Provider Department Center  12/10/2023 12:10 PM Tucker Steedley, Donell Cardinal, MD [facility] None  12/14/2023  2:45 PM El-Khouri, Evalene MATSU, RD [facility]  02/24/2024  3:10 PM Vannie Reche RAMAN, NP [facility] [phone] [place]

## 2023-12-11 LAB — HEMOGLOBIN A1C
Hgb A1c MFr Bld: 6.8 % — ABNORMAL HIGH (ref <5.7)
Mean Plasma Glucose: 148 mg/dL
eAG (mmol/L): 8.2 mmol/L

## 2023-12-13 ENCOUNTER — Ambulatory Visit: Payer: Self-pay | Admitting: Internal Medicine

## 2023-12-13 NOTE — Telephone Encounter (Signed)
 Can you please send a copy of A1c results to Dr. Almarie Aho office?   She has asked for this to be done   Thanks

## 2023-12-14 ENCOUNTER — Encounter: Payer: Self-pay | Admitting: Dietician

## 2023-12-14 ENCOUNTER — Encounter: Attending: Dietician | Admitting: Dietician

## 2023-12-14 DIAGNOSIS — E119 Type 2 diabetes mellitus without complications: Secondary | ICD-10-CM | POA: Diagnosis present

## 2023-12-14 NOTE — Progress Notes (Signed)
 Diabetes Self-Management Education  Visit Type: Follow-up  Appt. Start Time: 1445 Appt. End Time: 1515  12/14/2023  Elaine Burke, identified by name and date of birth, is a 73 y.o. female with a diagnosis of Diabetes: type 2 .   ASSESSMENT  History includes: anemia, arthritis, type 2 diabetes, HLD, HTN Labs noted: 12/10/23: A1c 6.8% Medications include: reviewed Supplements: vitamin D    A1c down from 7.8% at previous visit to 6.8% on 12/10/23.   Pt reports she has been drinking more water, getting five 8 oz cups daily. Pt reports she has cut out most other drinks, but about once a week will have a zero sugar soda.   Pt reports she has not been doing the youtube chair workouts but has been in physical therapy 2x/wk and tries to do her home exercises 2-3 times per week. Pt reports it is mostly stretches/balance exercises. Pt reports when she is done with physical therapy she plans to get a stationary bike to start exercising. Pt reports she used to go to the gym 5x/wk and ride the bike.   Pt states she has been trying to eat more vegetables.   Assessment of Previous Goals Established by Patient: (pt wants to continue with all goals).   Goal 1: drink at least 5 cups of water daily. - goal met, continue!   Goal 2: try 10 minute beginner chair workout on youtube 3 times a week. Or start using stationary bike. - goal not met, currently in physical therapy 2x/wk.    Goal 3: have a protein with breakfast. - goal met, continue!  There were no vitals taken for this visit. There is no height or weight on file to calculate BMI.   Diabetes Self-Management Education - 12/14/23 1439       Visit Information   Visit Type Follow-up      Health Coping   How would you rate your overall health? Good      Psychosocial Assessment   Patient Belief/Attitude about Diabetes Motivated to manage diabetes    What is the hardest part about your diabetes right now, causing you the most  concern, or is the most worrisome to you about your diabetes?   Making healty food and beverage choices    Self-care barriers None    Self-management support Doctor's office    Other persons present Patient    Patient Concerns Nutrition/Meal planning    Special Needs None    Preferred Learning Style No preference indicated    Learning Readiness Ready      Pre-Education Assessment   Patient understands the diabetes disease and treatment process. Needs Review    Patient understands incorporating nutritional management into lifestyle. Needs Review    Patient undertands incorporating physical activity into lifestyle. Needs Review    Patient understands using medications safely. Needs Review    Patient understands monitoring blood glucose, interpreting and using results Needs Review    Patient understands prevention, detection, and treatment of acute complications. Needs Review    Patient understands prevention, detection, and treatment of chronic complications. Needs Review    Patient understands how to develop strategies to address psychosocial issues. Needs Review    Patient understands how to develop strategies to promote health/change behavior. Needs Review      Complications   Last HgB A1C per patient/outside source 6.8 %    How often do you check your blood sugar? 0 times/day (not testing)      Dietary Intake   Breakfast  11am: greek yogurt with walnuts OR cereal with soymilk and babybell cheese    Snack (morning) none    Lunch soup with cheese tortellini and veggies    Snack (afternoon) none    Dinner chicken wing, baked potato and mixed greens    Snack (evening) none    Beverage(s) 48 oz water, 1 sugar free soda weekly      Activity / Exercise   Activity / Exercise Type ADL's;Light (walking / raking leaves)    How many days per week do you exercise? 2    How many minutes per day do you exercise? 30    Total minutes per week of exercise 60      Patient Education   Previous  Diabetes Education Yes    Disease Pathophysiology Explored patient's options for treatment of their diabetes;Factors that contribute to the development of diabetes    Healthy Eating Role of diet in the treatment of diabetes and the relationship between the three main macronutrients and blood glucose level;Plate Method;Meal options for control of blood glucose level and chronic complications.;Reviewed blood glucose goals for pre and post meals and how to evaluate the patients' food intake on their blood glucose level.    Being Active Helped patient identify appropriate exercises in relation to his/her diabetes, diabetes complications and other health issue.    Medications Reviewed patients medication for diabetes, action, purpose, timing of dose and side effects.    Monitoring Identified appropriate SMBG and/or A1C goals.    Chronic complications Relationship between chronic complications and blood glucose control;Identified and discussed with patient  current chronic complications    Diabetes Stress and Support Identified and addressed patients feelings and concerns about diabetes    Lifestyle and Health Coping Lifestyle issues that need to be addressed for better diabetes care      Individualized Goals (developed by patient)   Nutrition General guidelines for healthy choices and portions discussed    Physical Activity Exercise 3-5 times per week;30 minutes per day    Medications take my medication as prescribed    Monitoring  Test my blood glucose as discussed    Problem Solving Eating Pattern    Reducing Risk examine blood glucose patterns;do foot checks daily;treat hypoglycemia with 15 Burke of carbs if blood glucose less than 70mg /dL    Health Coping Ask for help with psychological, social, or emotional issues      Patient Self-Evaluation of Goals - Patient rates self as meeting previously set goals (% of time)   Nutrition >75% (most of the time)    Physical Activity 50 - 75 % (half of the  time)    Medications Not Applicable    Monitoring Not Applicable    Problem Solving and behavior change strategies  50 - 75 % (half of the time)    Reducing Risk (treating acute and chronic complications) 50 - 75 % (half of the time)    Health Coping 50 - 75 % (half of the time)      Post-Education Assessment   Patient understands the diabetes disease and treatment process. Comprehends key points    Patient understands incorporating nutritional management into lifestyle. Comprehends key points    Patient undertands incorporating physical activity into lifestyle. Comprehends key points    Patient understands using medications safely. Comphrehends key points    Patient understands monitoring blood glucose, interpreting and using results Comprehends key points    Patient understands prevention, detection, and treatment of acute complications. Comprehends key points  Patient understands prevention, detection, and treatment of chronic complications. Comprehends key points    Patient understands how to develop strategies to address psychosocial issues. Comprehends key points    Patient understands how to develop strategies to promote health/change behavior. Comprehends key points      Outcomes   Expected Outcomes Demonstrated interest in learning. Expect positive outcomes    Program Status Completed          Individualized Plan for Diabetes Self-Management Training:   Learning Objective:  Patient will have a greater understanding of diabetes self-management. Patient education plan is to attend individual and/or group sessions per assessed needs and concerns.   Plan:   There are no Patient Instructions on file for this visit.  Expected Outcomes:  Demonstrated interest in learning. Expect positive outcomes  Education material provided: none (virtual)  If problems or questions, patient to contact team via:  Phone  Future DSME appointment: as needed

## 2023-12-16 ENCOUNTER — Other Ambulatory Visit

## 2023-12-21 ENCOUNTER — Other Ambulatory Visit

## 2023-12-21 ENCOUNTER — Other Ambulatory Visit: Payer: Self-pay

## 2023-12-21 ENCOUNTER — Ambulatory Visit

## 2023-12-30 LAB — INSULIN-LIKE GROWTH FACTOR
IGF-I, LC/MS: 108 ng/mL (ref 34–245)
Z-Score (Female): 0.1 {STDV} (ref ?–2.0)

## 2023-12-30 LAB — DEXAMETHASONE, BLOOD: Dexamethasone, Serum: 808 ng/dL

## 2023-12-30 LAB — VITAMIN D 25 HYDROXY (VIT D DEFICIENCY, FRACTURES): Vit D, 25-Hydroxy: 74 ng/mL (ref 30–100)

## 2023-12-30 LAB — BASIC METABOLIC PANEL WITH GFR
BUN/Creatinine Ratio: 18 (calc) (ref 6–22)
BUN: 19 mg/dL (ref 7–25)
CO2: 29 mmol/L (ref 20–32)
Calcium: 10.3 mg/dL (ref 8.6–10.4)
Chloride: 105 mmol/L (ref 98–110)
Creat: 1.07 mg/dL — ABNORMAL HIGH (ref 0.60–1.00)
Glucose, Bld: 164 mg/dL — ABNORMAL HIGH (ref 65–99)
Potassium: 4 mmol/L (ref 3.5–5.3)
Sodium: 141 mmol/L (ref 135–146)
eGFR: 55 mL/min/1.73m2 — ABNORMAL LOW (ref >=60)

## 2023-12-30 LAB — PTH, INTACT AND CALCIUM
Calcium: 10.3 mg/dL (ref 8.6–10.4)
PTH: 146 pg/mL — ABNORMAL HIGH (ref 16–77)

## 2023-12-30 LAB — T4, FREE: Free T4: 0.9 ng/dL (ref 0.8–1.8)

## 2023-12-30 LAB — TSH: TSH: 1 m[IU]/L (ref 0.40–4.50)

## 2023-12-30 LAB — PROLACTIN: Prolactin: 28.3 ng/mL

## 2023-12-30 LAB — CORTISOL: Cortisol, Plasma: 2.3 ug/dL — ABNORMAL LOW

## 2023-12-30 LAB — ACTH: C206 ACTH: <5 pg/mL — ABNORMAL LOW (ref 6–50)

## 2023-12-30 LAB — ALBUMIN: Albumin: 3.9 g/dL (ref 3.6–5.1)

## 2024-01-11 ENCOUNTER — Inpatient Hospital Stay: Admission: RE | Admit: 2024-01-11 | Source: Ambulatory Visit

## 2024-01-20 ENCOUNTER — Inpatient Hospital Stay: Admission: RE | Admit: 2024-01-20 | Source: Ambulatory Visit

## 2024-01-26 ENCOUNTER — Ambulatory Visit (INDEPENDENT_AMBULATORY_CARE_PROVIDER_SITE_OTHER)
Admission: RE | Admit: 2024-01-26 | Discharge: 2024-01-26 | Disposition: A | Discharge status: Home / Self Care | Source: Ambulatory Visit | Attending: Internal Medicine | Admitting: Internal Medicine

## 2024-01-26 DIAGNOSIS — M810 Age-related osteoporosis without current pathological fracture: Secondary | ICD-10-CM | POA: Diagnosis not present

## 2024-02-07 LAB — BASIC METABOLIC PANEL WITH GFR
BUN/Creatinine Ratio: 17 (ref 12–28)
BUN/Creatinine Ratio: 17 (ref 12–28)
BUN: 15 mg/dL (ref 8–27)
BUN: 15 mg/dL (ref 8–27)
CO2: 24 mmol/L (ref 20–29)
CO2: 26 mmol/L (ref 20–29)
Calcium: 10.5 mg/dL — ABNORMAL HIGH (ref 8.7–10.3)
Calcium: 10.7 mg/dL — ABNORMAL HIGH (ref 8.7–10.3)
Chloride: 102 mmol/L (ref 96–106)
Chloride: 103 mmol/L (ref 96–106)
Creatinine, Ser: 0.87 mg/dL (ref 0.57–1.00)
Creatinine, Ser: 0.87 mg/dL (ref 0.57–1.00)
Glucose: 142 mg/dL — ABNORMAL HIGH (ref 70–99)
Glucose: 145 mg/dL — ABNORMAL HIGH (ref 70–99)
Potassium: 4.4 mmol/L (ref 3.5–5.2)
Potassium: 4.5 mmol/L (ref 3.5–5.2)
Sodium: 137 mmol/L (ref 134–144)
Sodium: 141 mmol/L (ref 134–144)
eGFR: 71 mL/min/1.73 (ref >59)
eGFR: 71 mL/min/1.73 (ref >59)

## 2024-02-08 ENCOUNTER — Ambulatory Visit (HOSPITAL_BASED_OUTPATIENT_CLINIC_OR_DEPARTMENT_OTHER): Payer: Self-pay | Admitting: Family

## 2024-02-09 ENCOUNTER — Telehealth: Payer: Self-pay | Admitting: Family

## 2024-02-09 NOTE — Telephone Encounter (Signed)
 PCP called in asking for pts most recent labs to be faxed over    Fax: [phone]

## 2024-02-09 NOTE — Telephone Encounter (Signed)
Sent to PCP as requested 

## 2024-02-24 ENCOUNTER — Ambulatory Visit (INDEPENDENT_AMBULATORY_CARE_PROVIDER_SITE_OTHER): Admitting: Family

## 2024-02-24 VITALS — BP 132/76 | HR 61 | Ht 63.0 in | Wt 196.7 lb

## 2024-02-24 DIAGNOSIS — G4733 Obstructive sleep apnea (adult) (pediatric): Secondary | ICD-10-CM | POA: Diagnosis not present

## 2024-02-24 DIAGNOSIS — I10 Essential (primary) hypertension: Secondary | ICD-10-CM

## 2024-02-24 NOTE — Progress Notes (Unsigned)
 Advanced [facility]pertension Clinic Assessment:    Date:  02/24/2024   ID:  Elaine Burke, DOB May 21, 1950, MRN [id number]  PCP:  Waylan Almarie SAUNDERS, MD  Cardiologist:  None  Nephrologist:  Referring MD: Waylan Almarie SAUNDERS, MD   CC: Hypertension  History of Present Illness:    Elaine Burke is a 73 y.o. female with a hx of hypertension, hyperlipidemia, OSA, diabetes, pituitary macroadenoma, hyperparathyroidism, arthritis. Here to follow up in the [facility] Clinic.   ED visit 11/02/2022 with BP up to the 200s despite BiDil , carvedilol , valsartan , Lasix .  Amlodipine  had been held due to LE edema.  She was asymptomatic.  BP improved with carvedilol , EKG unremarkable.  Subsequently amlodipine  increased to 10 mg in outpatient setting due to elevated blood pressure.  At visit 11/2022 she saw her endocrinologist Dr. Randell for with normal cortisol levels.  Renal aldosterone labs collected but canceled due to lack of specimens.  Initially established with Dr. Elmira 12/14/22 and BP controlled, Amlodipine  reduced to 5mg  dose due to LE edema.  Renal duplex 12/2022 with no renal artery stenosis bilaterally.  Echo 01/01/2023 normal LVEF 60 to 65%,gr1dd, RV normal, no significant valvular abnormalities.  Sleep study 08/13/2023 with moderate OSA AHI 24/h and severe during REM sleep with REM AHI 43.4/h.  She was last seen 11/29/2023.  BP at home averaging 135/80 on amlodipine , carvedilol , valsartan , Bidil , furosemide . She was doing well in PT. LE edema improved with compression stockings. Furosemide  increased to 40mg  daily.   Did not see much difference in Lasix  from 20mg  to 40mg  in her swelling or her blood pressure.   Presents today for follow up. Notes returning to 20mg  dose was recommended by nurse from her insurance who came out to her home.   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  ?only lasix  20?  Her right  leg will swell a bit worse than her left side. She has surgery on her right leg. BP at home mostly in the 130s/70s.   Amlodipine  in the evening, Carvedilol  BID, Furosemide  AM, Bidil  TID (sets an alarm on her phone), Valsartan  AM   Previous antihypertensives:   Past Medical History:  Diagnosis Date   Anemia    Arthritis    Arthritis    Diabetes mellitus without complication (HCC)    HLD (hyperlipidemia)    Hypertension    Pituitary macroadenoma (HCC)    Pure hypercholesterolemia 11/29/2023   Snoring 12/30/2022    Past Surgical History:  Procedure Laterality Date   ABDOMINAL HYSTERECTOMY     CESAREAN SECTION     COLONOSCOPY     EYE SURGERY     bilateral cataracts   ORIF ANKLE FRACTURE Right 07/02/2022   Procedure: OPEN TREATMENT OF RIGHT TRIMALLEOLAR ANKLE FRACTURE;  Surgeon: Elsa Lonni SAUNDERS, MD;  Location: [facility] OR;  Service: Orthopedics;  Laterality: Right;  LENGTH OF SURGERY: 150 MINUTES   SYNDESMOSIS REPAIR Right 07/02/2022   Procedure: POSSIBLE OPEN TREATMENT OF SYNDESMOSIS;  Surgeon: Elsa Lonni SAUNDERS, MD;  Location: [facility] OR;  Service: Orthopedics;  Laterality: Right;    Current Medications: Active Medications[1]   Allergies:   Shellfish allergy, Lisinopril, and Penicillins   Social History   Socioeconomic History   Marital status: Married    Spouse name: Not on file   Number of children: 1   Years of education: Not on file   Highest education level: Not on file  Occupational History  Not on file  Tobacco Use   Smoking status: Never   Smokeless tobacco: Never  Vaping Use   Vaping status: Never Used  Substance and Sexual Activity   Alcohol  use: No   Drug use: No   Sexual activity: Not on file  Other Topics Concern   Not on file  Social History Narrative   Not on file   Social Drivers of Health   Tobacco Use: Low Risk (02/24/2024)   Patient History    Smoking Tobacco Use: Never    Smokeless Tobacco Use: Never    Passive Exposure: Not on file   Financial Resource Strain: Not on file  Food Insecurity: No Food Insecurity (09/29/2023)   Epic    Worried About Programme Researcher, Broadcasting/film/video in the Last Year: Never true    Ran Out of Food in the Last Year: Never true  Transportation Needs: No Transportation Needs (07/02/2022)   PRAPARE - Administrator, Civil Service (Medical): No    Lack of Transportation (Non-Medical): No  Physical Activity: Not on file  Stress: Not on file  Social Connections: Not on file  Depression (PHQ2-9): Low Risk (09/29/2023)   Depression (PHQ2-9)    PHQ-2 Score: 0  Alcohol  Screen: Not on file  Housing: Low Risk (07/02/2022)   Housing    Last Housing Risk Score: 0  Utilities: Not At Risk (07/02/2022)   AHC Utilities    Threatened with loss of utilities: No  Health Literacy: Not on file     Family History: The patient's family history includes Breast cancer in her paternal aunt; Dementia in her mother; Heart attack (age of onset: 75) in her mother; Hypertension in her brother, mother, and sister; Other in her father; Stroke in her paternal grandfather.  ROS:   Please see the history of present illness.     All other systems reviewed and are negative.  EKGs/Labs/Other Studies Reviewed:         Recent Labs: 12/21/2023: TSH 1.00 02/07/2024: BUN 15; BUN 15; Creatinine, Ser 0.87; Creatinine, Ser 0.87; Potassium 4.5; Potassium 4.4; Sodium 141; Sodium 137   Recent Lipid Panel No results found for: CHOL, TRIG, HDL, CHOLHDL, VLDL, LDLCALC, LDLDIRECT  Physical Exam:   VS:  BP 136/78   Pulse 61   Ht 5' 3 (1.6 m)   Wt 196 lb 11.2 oz (89.2 kg)   SpO2 96%   BMI 34.84 kg/m  , BMI Body mass index is 34.84 kg/m. GENERAL:  Well appearing HEENT: Pupils equal round and reactive, fundi not visualized, oral mucosa unremarkable NECK:  No jugular venous distention, waveform within normal limits, carotid upstroke brisk and symmetric, no bruits, no thyromegaly LYMPHATICS:  No cervical  adenopathy LUNGS:  Clear to auscultation bilaterally HEART:  RRR.  PMI not displaced or sustained,S1 and S2 within normal limits, no S3, no S4, no clicks, no rubs, no murmurs ABD:  Flat, positive bowel sounds normal in frequency in pitch, no bruits, no rebound, no guarding, no midline pulsatile mass, no hepatomegaly, no splenomegaly EXT:  2 plus pulses throughout, no edema, no cyanosis no clubbing SKIN:  No rashes no nodules NEURO:  Cranial nerves II through XII grossly intact, motor grossly intact throughout PSYCH:  Cognitively intact, oriented to person place and time   ASSESSMENT/PLAN:    HTN -  OSA -   Screening for Secondary Hypertension: { Click here to document screening for secondary causes of HTN  :[id number]}    12/30/2022    2:08  PM  Causes  Drugs/Herbals Screened     - Comments limits salt but eats out.  Occasional tea. Coke Zero 3/week.  No EtOH.  +Aleve  rarely  Renovascular HTN Screened  Sleep Apnea Screened     - Comments +snores.  apnea.  Thyroid  Disease Screened  Hyperaldosteronism Screened     - Comments check renin and aldosterone  Pheochromocytoma Screened     - Comments check catecholamines and metanephrines  Cushing's Syndrome Screened     - Comments ongoing screening per endocrine  Hyperparathyroidism Screened  Coarctation of the Aorta Screened     - Comments BP initially assymmetric but wnl on repeat  Compliance Screened    Relevant Labs/Studies:    Latest Ref Rng & Units 02/07/2024    4:02 PM 12/21/2023    8:24 AM 12/01/2023    2:24 PM  Basic Labs  Sodium 134 - 144 mmol/L 134 - 144 mmol/L 141    137  141  140   Potassium 3.5 - 5.2 mmol/L 3.5 - 5.2 mmol/L 4.5    4.4  4.0  4.3   Creatinine 0.57 - 1.00 mg/dL 9.42 - 8.99 mg/dL 9.12    9.12  8.92  9.10        Latest Ref Rng & Units 12/21/2023    8:24 AM 06/09/2023   12:42 PM  Thyroid    TSH 0.40 - 4.50 mIU/L 1.00  2.32        Latest Ref Rng & Units 06/09/2023   12:42 PM 12/07/2022   12:25 PM   Renin/Aldosterone   Aldosterone *** ng/dL 3  CANCELED           Latest Ref Rng & Units 12/21/2023    8:24 AM 06/09/2023   12:42 PM 01/04/2023    9:07 AM 12/07/2022   12:25 PM 08/29/2021   10:42 AM 07/10/2020   11:34 AM 11/08/2019   10:39 AM  Cortisol  Cortisol  mcg/dL 2.3  84.4  0.7  9.9  9.6  10.4  10.8        01/06/2023   11:21 AM  Renovascular   Renal Artery US  Completed Yes      Disposition:    FU with MD/APP/PharmD in {gen number [id number]} months    Medication Adjustments/Labs and Tests Ordered: Current medicines are reviewed at length with the patient today.  Concerns regarding medicines are outlined above.  No orders of the defined types were placed in this encounter.  No orders of the defined types were placed in this encounter.    Signed, Reche GORMAN Finder, NP  02/24/2024 3:26 PM    [facility]     [1]  Current Meds  Medication Sig   alendronate  (FOSAMAX ) 70 MG tablet Take 1 tablet (70 mg total) by mouth every 7 (seven) days. Take with a full glass of water on an empty stomach.   amLODipine  (NORVASC ) 5 MG tablet Take 1 tablet (5 mg total) by mouth daily.   carvedilol  (COREG ) 25 MG tablet Take 25 mg by mouth 2 (two) times daily.   Cholecalciferol  (VITAMIN D -3) 125 MCG (5000 UT) TABS Take 5,000 Units by mouth daily.   furosemide  (LASIX ) 40 MG tablet Take 1 tablet (40 mg total) by mouth daily. (Patient taking differently: Take 20 mg by mouth daily.)   isosorbide -hydrALAZINE  (BIDIL ) 20-37.5 MG tablet Take 1 tablet by mouth 3 (three) times daily.   potassium chloride  (KLOR-CON ) 8 MEQ tablet Take 1 tablet (8 mEq total) by mouth  daily.   rosuvastatin  (CRESTOR ) 5 MG tablet Take 5 mg by mouth daily.   valsartan  (DIOVAN ) 320 MG tablet Take 1 tablet (320 mg total) by mouth daily.

## 2024-02-24 NOTE — Telephone Encounter (Signed)
 Prior Authorization for CPAP TITRATION sent to [employer] via web portal. APPROVED. Approval Date: 11/30/2023 12:00:00 AM Service Code: [id number] Start Date: 11/30/2023 Expiration Date: 05/28/2024

## 2024-02-24 NOTE — Patient Instructions (Signed)
 Medication Instructions:  Continue your current medications.   *If you need a refill on your cardiac medications before your next appointment, please call your pharmacy*  Follow-Up: At [facility], you and your health needs are our priority.  As part of our continuing mission to provide you with exceptional heart care, our providers are all part of one team.  This team includes your primary Cardiologist (physician) and Advanced Practice Providers or APPs (Physician Assistants and Nurse Practitioners) who all work together to provide you with the care you need, when you need it.  Your next appointment:   3 month(s) with Advanced Hypertension Clinic : Annabella Scarce, MD, Reche Finder, NP, or Kristin Alvstad, PharmD    We recommend signing up for the patient portal called MyChart.  Sign up information is provided on this After Visit Summary.  MyChart is used to connect with patients for Virtual Visits (Telemedicine).  Patients are able to view lab/test results, encounter notes, upcoming appointments, etc.  Non-urgent messages can be sent to your provider as well.   To learn more about what you can do with MyChart, go to [email or web address].   Other Instructions  We will reach out to you tomorrow when we know next steps for CPAP titration!  If your swelling or blood pressure worsen we could consider changing Amlodipine  to Spironolactone. This may enable you to get off the potassium supplement as well because Spironolactone can increase your potassium level.

## 2024-02-25 NOTE — Telephone Encounter (Addendum)
 Reached out to patient to inform her her sleep study has been approved and to ask her to call the sleep lab to get scheduled.Patient understands her titration study will be done at [facility] sleep lab. Patient agrees with treatment and thanked me for call.

## 2024-02-28 NOTE — Telephone Encounter (Signed)
 Closing open encounter note.

## 2024-04-30 ENCOUNTER — Ambulatory Visit (HOSPITAL_BASED_OUTPATIENT_CLINIC_OR_DEPARTMENT_OTHER): Admitting: Cardiology

## 2024-05-24 ENCOUNTER — Encounter (HOSPITAL_BASED_OUTPATIENT_CLINIC_OR_DEPARTMENT_OTHER): Admitting: Cardiovascular Disease

## 2024-08-14 ENCOUNTER — Ambulatory Visit: Admitting: Neurology

## 2024-12-12 ENCOUNTER — Ambulatory Visit: Admitting: Internal Medicine
